# Patient Record
Sex: Female | Born: 1950 | Race: Black or African American | Hispanic: No | Smoking: Former smoker
Health system: Southern US, Community
[De-identification: ages and names within clinical notes are randomized; demographics above are authoritative.]

## PROBLEM LIST (undated history)

## (undated) DIAGNOSIS — F329 Major depressive disorder, single episode, unspecified: Secondary | ICD-10-CM

## (undated) DIAGNOSIS — I10 Essential (primary) hypertension: Secondary | ICD-10-CM

## (undated) DIAGNOSIS — D126 Benign neoplasm of colon, unspecified: Secondary | ICD-10-CM

## (undated) DIAGNOSIS — F319 Bipolar disorder, unspecified: Secondary | ICD-10-CM

## (undated) DIAGNOSIS — F209 Schizophrenia, unspecified: Secondary | ICD-10-CM

## (undated) DIAGNOSIS — IMO0001 Reserved for inherently not codable concepts without codable children: Secondary | ICD-10-CM

## (undated) DIAGNOSIS — F32A Depression, unspecified: Secondary | ICD-10-CM

## (undated) DIAGNOSIS — E669 Obesity, unspecified: Secondary | ICD-10-CM

## (undated) HISTORY — PX: REMOVAL OF GASTROINTESTINAL STOMATIC  TUMOR OF STOMACH: SHX6339

---

## 2007-12-22 ENCOUNTER — Emergency Department: Payer: Self-pay

## 2007-12-28 ENCOUNTER — Other Ambulatory Visit: Payer: Self-pay

## 2007-12-28 ENCOUNTER — Inpatient Hospital Stay: Payer: Self-pay | Admitting: Internal Medicine

## 2008-08-29 IMAGING — CT CT HEAD WITHOUT CONTRAST
2 series · 16 of 30 positions shown, 20 images · non-contrast
Comparison: none

REASON FOR EXAM: unwitnessed fall this morning, LOC changes
COMMENTS:

[Series 2: without · axial · non-contrast · 0.46mm/px · z∈[-176,-56]mm · 13 of 29 slices shown, 17 images]
[im 3/29  brain]
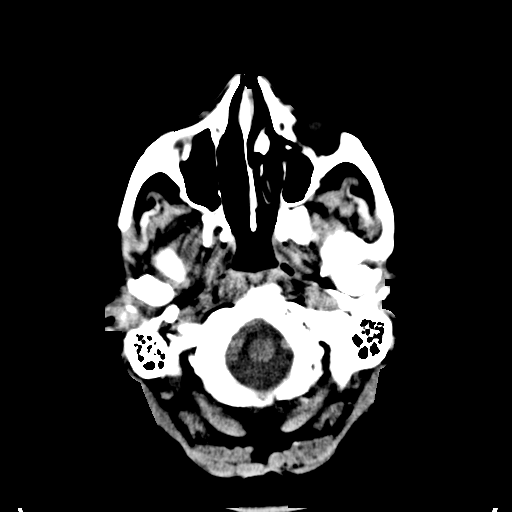
[im 3/29  bone]
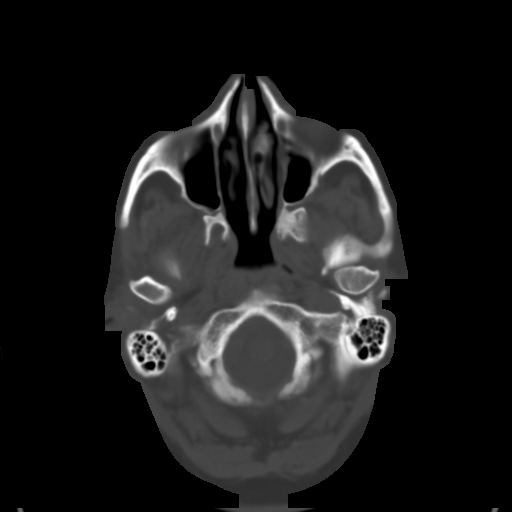
[im 5/29  brain]
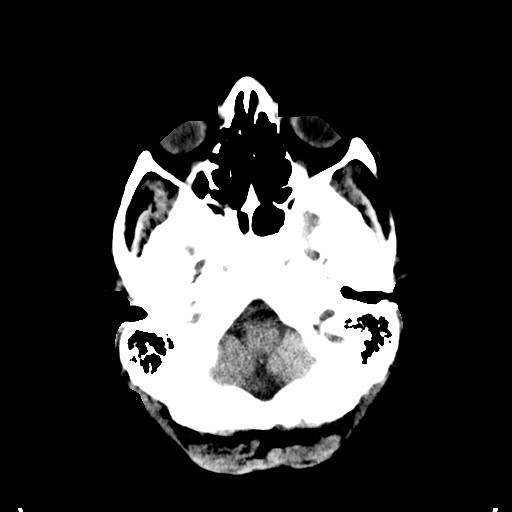
[im 7/29  brain]
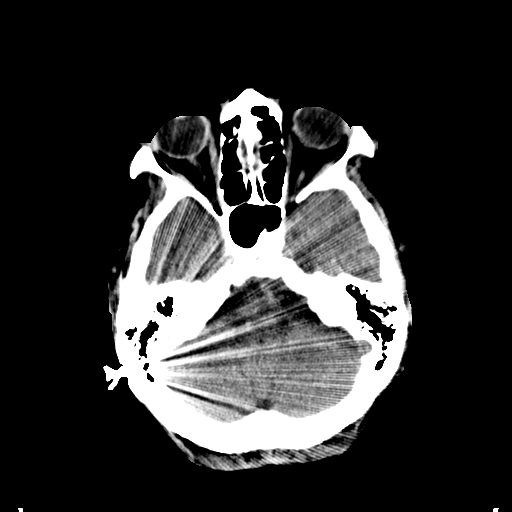
[im 9/29  brain]
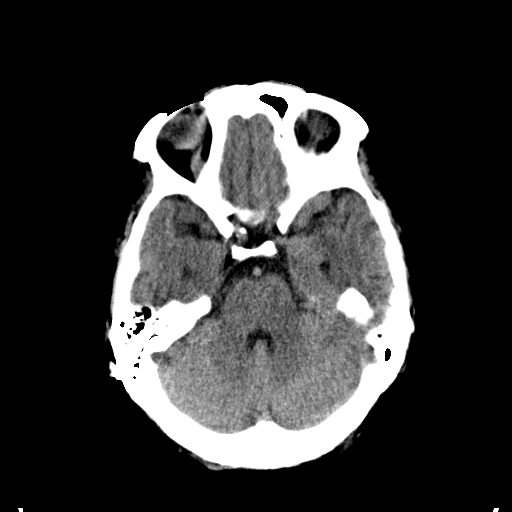
[im 11/29  brain]
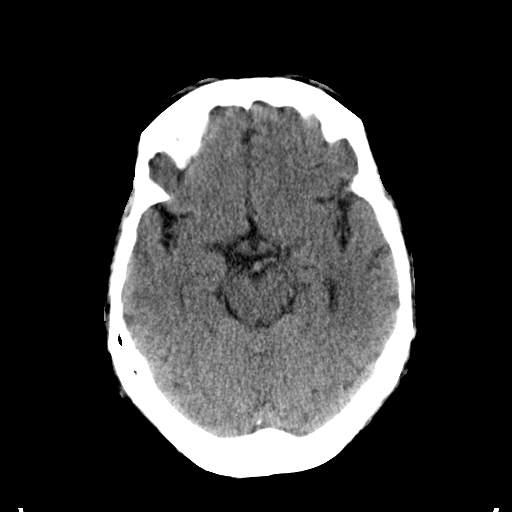
[im 11/29  bone]
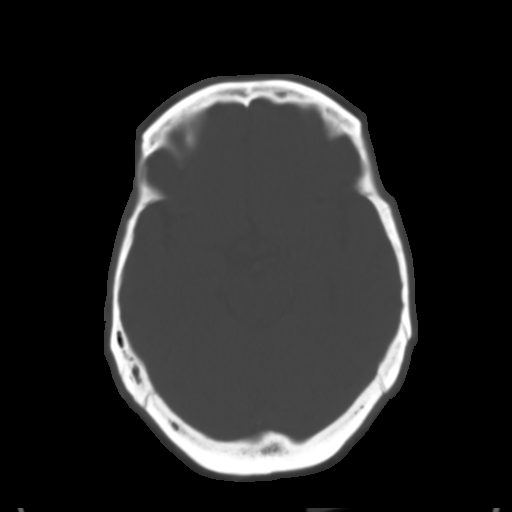
[im 13/29  brain]
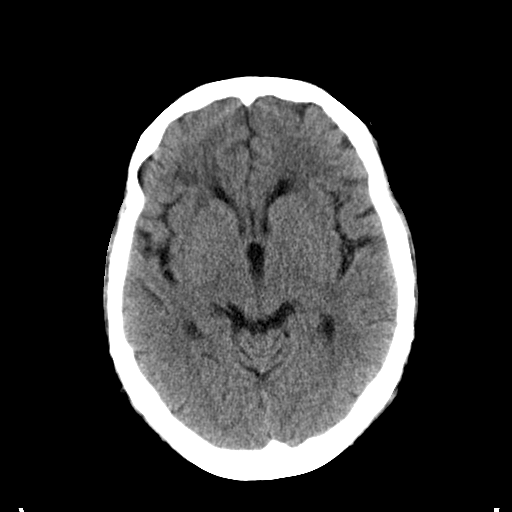
[im 15/29  brain]
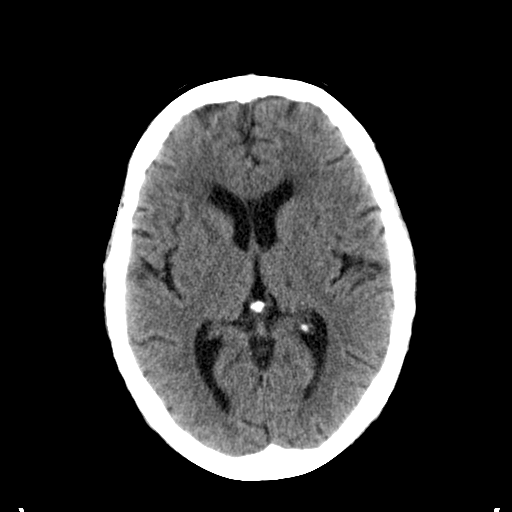
[im 17/29  brain]
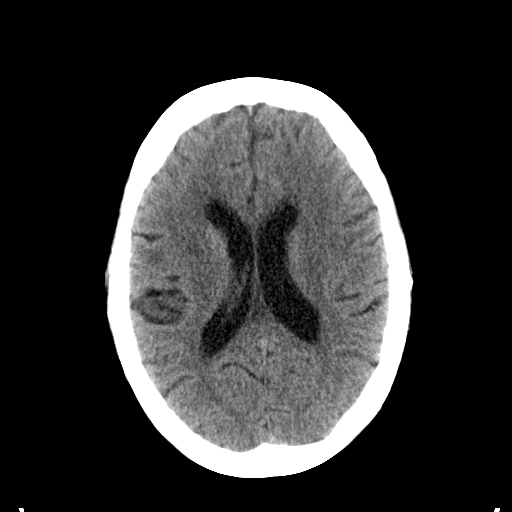
[im 19/29  brain]
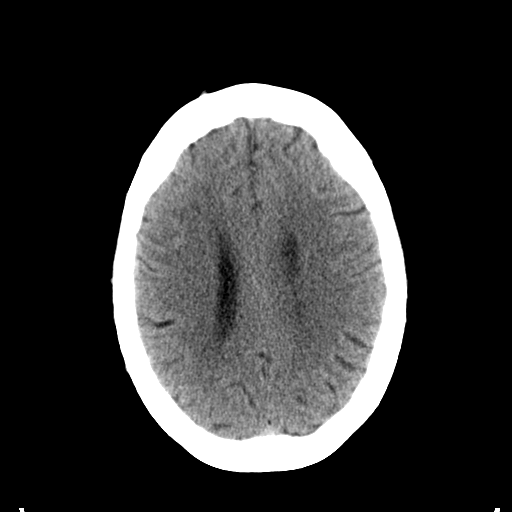
[im 19/29  bone]
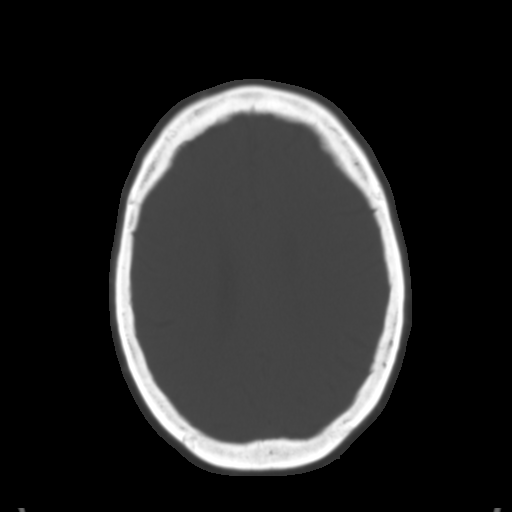
[im 21/29  brain]
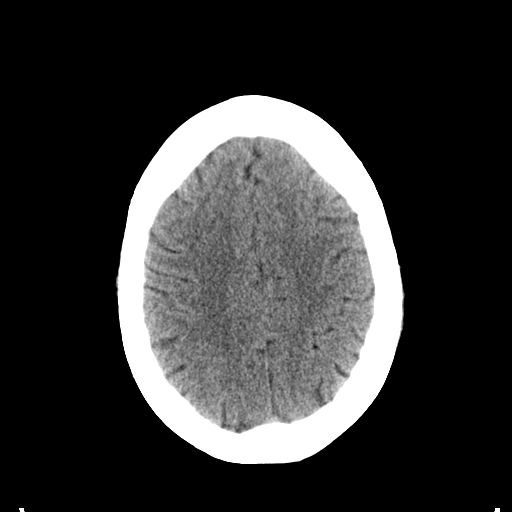
[im 23/29  brain]
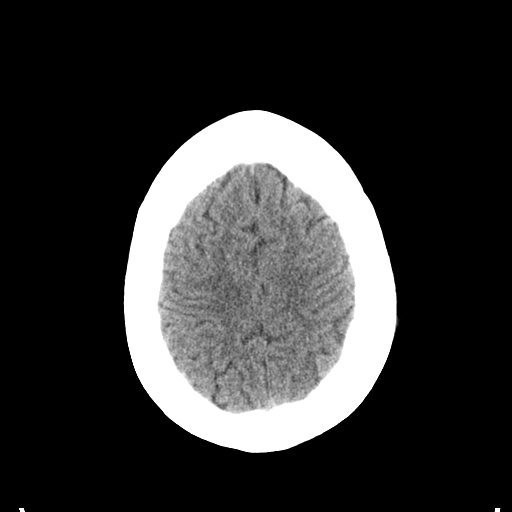
[im 25/29  brain]
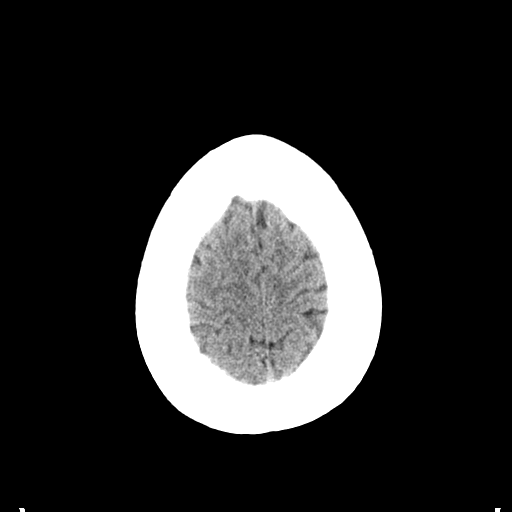
[im 27/29  brain]
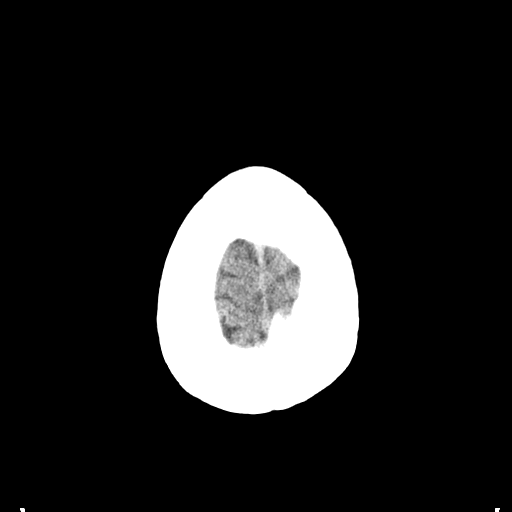
[im 27/29  bone]
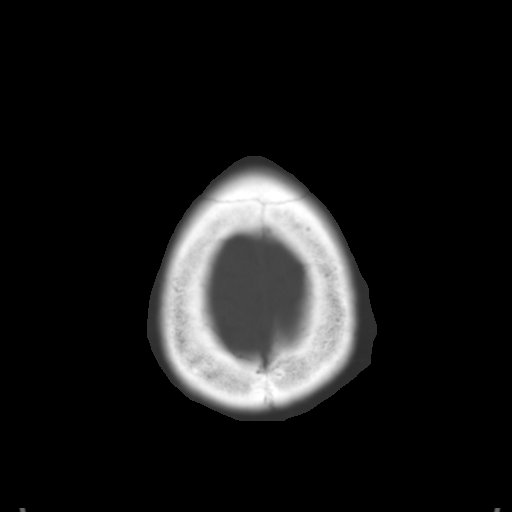

[Series 3: bone · axial · 0.46mm/px · z∈[-176,-136]mm · 3 of 29 slices shown]
[im 3/29  bone]
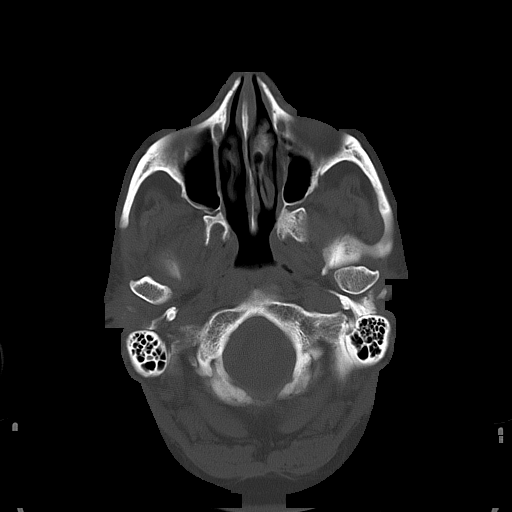
[im 7/29  bone]
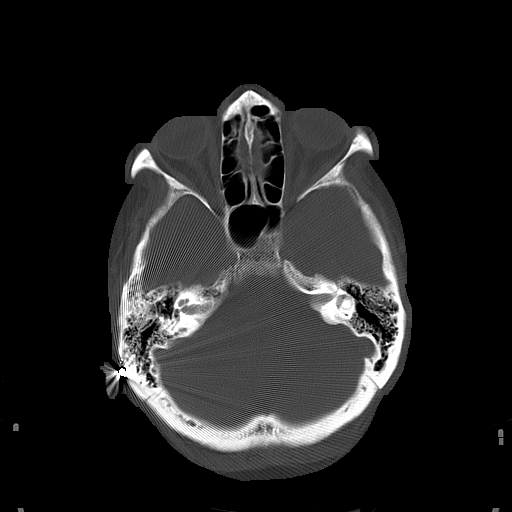
[im 11/29  bone]
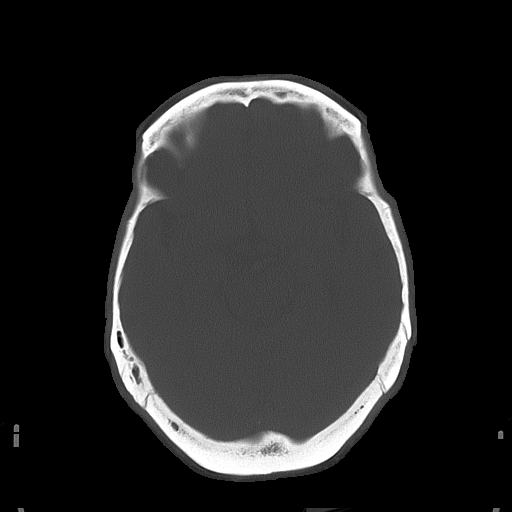

[16 of 30 positions shown; findings below may reference images not displayed]

PROCEDURE:     CT  - CT HEAD WITHOUT CONTRAST  - January 02, 2008  [DATE]

RESULT:     Comparison is made to prior study dated 12-28-07.

There is no evidence of intra-axial or extra-axial fluid collections or
evidence of acute hemorrhage. No secondary signs are appreciated to suggest
mass-effect, subacute or chronic infarction. The visualized bony skeleton
demonstrates no evidence of fracture nor dislocation. There is evidence of
diffuse cortical atrophy and areas of low attenuation within the subcortical
and deep white matter regions.
IMPRESSION: 1.Age related changes, findings which appear to represent the sequela of
small vessel ischemic changes without evidence of focal or acute
abnormalities.

## 2009-01-27 ENCOUNTER — Ambulatory Visit: Payer: Self-pay | Admitting: Family Medicine

## 2009-09-13 ENCOUNTER — Ambulatory Visit: Payer: Self-pay | Admitting: Gastroenterology

## 2010-07-01 ENCOUNTER — Inpatient Hospital Stay: Payer: Self-pay | Admitting: Internal Medicine

## 2012-05-09 ENCOUNTER — Inpatient Hospital Stay: Payer: Self-pay | Admitting: Internal Medicine

## 2012-05-09 LAB — URINALYSIS, COMPLETE
Bilirubin,UR: NEGATIVE
Glucose,UR: NEGATIVE mg/dL (ref 0–75)
Hyaline Cast: 3
Nitrite: NEGATIVE
Ph: 5 (ref 4.5–8.0)
RBC,UR: 5 /HPF (ref 0–5)
Specific Gravity: 1.026 (ref 1.003–1.030)
Squamous Epithelial: 2
Transitional Epi: <1

## 2012-05-09 LAB — COMPREHENSIVE METABOLIC PANEL
Albumin: 3.5 g/dL (ref 3.4–5.0)
Anion Gap: 6 — ABNORMAL LOW (ref 7–16)
BUN: 24 mg/dL — ABNORMAL HIGH (ref 7–18)
Co2: 31 mmol/L (ref 21–32)
Osmolality: 282 (ref 275–301)
Potassium: 3.8 mmol/L (ref 3.5–5.1)
SGOT(AST): 70 U/L — ABNORMAL HIGH (ref 15–37)
Sodium: 139 mmol/L (ref 136–145)
Total Protein: 6.8 g/dL (ref 6.4–8.2)

## 2012-05-09 LAB — LITHIUM LEVEL: Lithium: <0.2 mmol/L — ABNORMAL LOW

## 2012-05-09 LAB — TROPONIN I: Troponin-I: 0.07 ng/mL — ABNORMAL HIGH

## 2012-05-09 LAB — ETHANOL
Ethanol %: <0.003 % (ref 0.000–0.080)
Ethanol: <3 mg/dL

## 2012-05-09 LAB — CBC
MCH: 28.4 pg (ref 26.0–34.0)
MCV: 86 fL (ref 80–100)
RDW: 12.9 % (ref 11.5–14.5)
WBC: 3.4 10*3/uL — ABNORMAL LOW (ref 3.6–11.0)

## 2012-05-09 LAB — CK TOTAL AND CKMB (NOT AT ARMC)
CK, Total: 991 U/L — ABNORMAL HIGH (ref 21–215)
CK-MB: 3.1 ng/mL (ref 0.5–3.6)

## 2012-05-09 LAB — DRUG SCREEN, URINE
Barbiturates, Ur Screen: NEGATIVE (ref ?–200)
MDMA (Ecstasy)Ur Screen: POSITIVE (ref ?–500)
Methadone, Ur Screen: NEGATIVE (ref ?–300)
Opiate, Ur Screen: NEGATIVE (ref ?–300)
Phencyclidine (PCP) Ur S: NEGATIVE (ref ?–25)

## 2012-05-09 LAB — ACETAMINOPHEN LEVEL: Acetaminophen: 3 ug/mL — ABNORMAL LOW

## 2012-05-09 LAB — SALICYLATE LEVEL: Salicylates, Serum: <1.7 mg/dL

## 2012-05-10 LAB — BASIC METABOLIC PANEL
Anion Gap: 7 (ref 7–16)
BUN: 21 mg/dL — ABNORMAL HIGH (ref 7–18)
Chloride: 106 mmol/L (ref 98–107)
Co2: 26 mmol/L (ref 21–32)
EGFR (African American): >60
EGFR (Non-African Amer.): 56 — ABNORMAL LOW
Glucose: 151 mg/dL — ABNORMAL HIGH (ref 65–99)
Osmolality: 283 (ref 275–301)
Potassium: 4.2 mmol/L (ref 3.5–5.1)
Sodium: 139 mmol/L (ref 136–145)

## 2012-05-10 LAB — CBC WITH DIFFERENTIAL/PLATELET
Basophil #: 0 10*3/uL (ref 0.0–0.1)
Basophil %: 0 %
Eosinophil %: 0 %
HCT: 39.6 % (ref 35.0–47.0)
Lymphocyte %: 42.9 %
MCHC: 33.3 g/dL (ref 32.0–36.0)
Monocyte #: 0.1 x10 3/mm — ABNORMAL LOW (ref 0.2–0.9)
Monocyte %: 6.2 %
Neutrophil #: 0.7 10*3/uL — ABNORMAL LOW (ref 1.4–6.5)
WBC: 1.4 10*3/uL — CL (ref 3.6–11.0)

## 2012-05-10 LAB — CK TOTAL AND CKMB (NOT AT ARMC)
CK, Total: 598 U/L — ABNORMAL HIGH (ref 21–215)
CK-MB: 2.3 ng/mL (ref 0.5–3.6)

## 2012-05-10 LAB — TROPONIN I: Troponin-I: 0.06 ng/mL — ABNORMAL HIGH

## 2012-05-11 LAB — CBC WITH DIFFERENTIAL/PLATELET
Basophil #: 0 10*3/uL (ref 0.0–0.1)
Basophil %: 0 %
Eosinophil #: 0 10*3/uL (ref 0.0–0.7)
Eosinophil %: 0 %
HCT: 40 % (ref 35.0–47.0)
HGB: 13.1 g/dL (ref 12.0–16.0)
Lymphocyte #: 0.8 10*3/uL — ABNORMAL LOW (ref 1.0–3.6)
Lymphocyte %: 17.7 %
MCH: 28.3 pg (ref 26.0–34.0)
MCV: 86 fL (ref 80–100)
Neutrophil #: 3.4 10*3/uL (ref 1.4–6.5)
Platelet: 165 10*3/uL (ref 150–440)
RBC: 4.65 10*6/uL (ref 3.80–5.20)

## 2012-06-03 ENCOUNTER — Ambulatory Visit: Payer: Self-pay | Admitting: Family Medicine

## 2013-04-13 ENCOUNTER — Ambulatory Visit: Payer: Self-pay | Admitting: Family Medicine

## 2013-06-25 ENCOUNTER — Ambulatory Visit: Payer: Self-pay | Admitting: Family Medicine

## 2015-03-07 ENCOUNTER — Ambulatory Visit: Payer: Self-pay | Admitting: Physician Assistant

## 2015-04-24 NOTE — H&P (Signed)
PATIENT NAME:  Jill Smith, Jill Smith MR#:  [id number] DATE OF BIRTH:  [birth year]  DATE OF ADMISSION:  05/09/2012  PRIMARY CARE PHYSICIAN:  Dr Bertram Gala.    HISTORY OF PRESENT ILLNESS:  Patient is a 64 year old African American female with past medical history significant for history of neutropenia, history of schizophrenia, muscle weakness, history of impaired fasting glucose, also history of renal disease, questionable chronic obstructive pulmonary disease as she is on inhalers at home, presented to the hospital with complaints of poor responsiveness, altered mental status. Apparently the patient was found by home health nursing staff being almost unresponsive, decreased level of consciousness. The patient's sister, however, admits that patient has been having problems for the past 1 to 2 weeks. She has been falling at least 2 or 3 times a day. She is very weak, getting weak over a period of time especially over the past one week. She apparently is taking the daytime medications at nighttime and vice versa.  Over the past two days she has wheezing, having some cough with scant sputum production. No noted fevers, and the patient is a little bit more awake now and is able to complain of weakness as well as buttocks pain.  Patient's sister tells me that she is gaining weight, and she has been having cough and significant wheezes, but no reported shortness of breath or chest pain. She was very drowsy initially in the emergency room but able to also respond to verbal stimuli. Her speech was slurry and remained slurry during my evaluation. Patient not able to provide much more history. She was noted to be somewhat hypertensive with systolic blood pressure of 97/56 and oxygen saturation was 94% on room air and very wheezy, and hospitalist service was contacted for admission.   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 Significant for ataxia, slurry speech as well as tremors, altered mental status,  as well as confusion due to lithium toxicity at least twice in December 2008.  Other times she also had acute renal failure.  She was admitted also in July 2011. At that time, the patient's lithium was discontinued. History of hypertension, neuroleptic-induced tardive dyskinesia, tremors.    PAST SURGICAL HISTORY: None.   MEDICATIONS: According to medical records, she is on trihexyphenidyl 2 mg 3 times daily, isosorbide mononitrate 60 mg p.o. daily, aspirin 81 mg p.o. daily, metoprolol tartrate 25 mg p.o. twice daily, vitamin E 400 units 2 capsules twice daily. Haldol injections 150 mg intramuscularly monthly. Last apparently was on March 05, 2012.  Lisinopril 10 mg p.o. daily.  Bupropion SR 150 mg p.o. twice daily, triamcinolone acetonide to face and neck 1 to 2 times daily for dark spots.  Latuda 80 mg p.o. at supper. Haloperidol 2 mg in the morning and 4 mg at bedtime.  Tri-Luma 0.01/4/0.05% cream apply to dark spots twice daily.  Vitamin D 50,000 units p.o. monthly.  Tylenol 325 mg p.o. every 6 hours as needed.  Ventolin HFA 2 puffs as needed. Nystatin 100,000 units in 1 gram powder under the breasts 3 times daily as needed.  Diazepam10 mg p.o. twice  daily.    SOCIAL HISTORY: No smoking, however used to smoke as well as drink alcohol.  She is not smoking now for the past 20 years or so. She is single. No children. Lives with her sister. She is on disability.   FAMILY HISTORY: Hypertension as well as diabetes in  patient's mother. Patient's father died from abdominal cancer.   REVIEW OF SYSTEMS: Difficult to obtain as patient is loopy, somnolent, has been having weakness for the past 1 to 2 weeks with the help of her sister. She admits of pain in her buttock area. Weight gain, approximately 280 pounds now. Cough as well as wheezing and inability to stand or walk but no lateralized weakness. No fevers, no chills, blurry vision, double vision, glaucoma, or cataracts. ENT: Denies any tinnitus, allergies, epistaxis, sinus pain, dentures, difficulty swallowing. She denies any hemoptysis, asthma, COPD.   CARDIOVASCULAR: Denies any chest pain, orthopnea, PND, erythema, arrhythmia, palpitations, or syncope.  GASTROINTESTINAL: Denies nausea, vomiting, diarrhea, or constipation. GENITOURINARY: Denies dysuria, hematuria, frequency, incontinence. ENDOCRINOLOGY: Denies polydipsia, nocturia, thyroid problems, heat or cold intolerance, or thirst.  HEMATOLOGIC: Denies anemia, easy bruising, bleeding, swollen glands. SKIN: Denies acne, rashes, lesions, or change in moles. MUSCULOSKELETAL: Denies arthritis, cramps or gout.  NEUROLOGIC: No numbness, epilepsy, or tremor. PSYCHIATRY: Denies anxiety, insomnia, or depression.   PHYSICAL EXAMINATION:  VITAL SIGNS: On arrival to the hospital, temperature is 97.3, pulse 61, respiration rate 18, blood pressure 97/59, saturation 94% on room air.   GENERAL: This is well-developed, well-nourished, obese African American female, very somnolent, however able to open her eyes and converse briefly, slurring of speech.   HEENT: Her pupils are equal, reactive to light.  Extraocular muscles intact. No icterus or conjunctivitis. Normal hearing. No pharyngeal erythema.  Mucosa is dry.   NECK: No masses. Supple, nontender.  Thyroid not enlarged.  No adenopathy.  No JVD, carotid bruits bilaterally. Full range of motion.   LUNGS: Patient does have significant wheezing bilaterally, inspiratory as well  as expiratory.  Few rhonchi were heard, somewhat diminished breath sounds bilaterally, labored inspirations as well as increased effort to breathe. No dullness to percussion. In mild respiratory distress and slightly tachypneic, especially whenever she tries to speak.   CARDIOVASCULAR: S1, S2 appreciated. Rythm wasd regular. PMI nondisplaced. Chest is nontender to palpation.      EXTREMITIES: 1+ pedal pulses. No lower extremity edema, calf tenderness, cyanosis was noted.   ABDOMEN: Soft, nontender. Bowel sounds are present.  No hepatosplenomegaly or masses were noted.     RECTAL: Deferred.    MUSCULOSKELETAL: Able to move her upper extremities as well as lower extremities feet and toes.  Not able to do much more because of significant somnolence and poor cooperation. Not able to assess her for kyphosis. Gait is not tested.   SKIN: Skin did not reveal any rashes, lesions, erythema, nodularity, or induration. It was warm and dry to palpation.   LYMPH: No adenopathy in the cervical region.   NEUROLOGICAL: Cranial nerves grossly intact. Sensory is grossly intact. However, difficult to evaluate. The patient does have some dysarthria, slurring of speech, no aphasia. She is somnolent, poorly oriented. However, recognizes me from some time ago when I took her history and physical in July 2011.    LABORATORIES: BMP showed glucose 106, BUN of 24, otherwise been unremarkable. Estimated GFR for African American would be 53. Alcohol level less than 0.003%. Liver enzymes showed AST elevation to 70. Cardiac enzymes : CK total was 1511, MB  fraction 5.0, troponin elevated at 0.08. Lithium level less than 0.2. Urine drug screen was negative for all metabolize.  White blood cell count is low at 3.4, hemoglobin 13.4, platelet count 168,000. Urinalysis: Yellow clear urine, negative for glucose, bilirubin, or ketones, specific gravity 1.026, pH 5.0, 1+ blood, 30 mg/dL of protein, negative for nitrites or leukocyte  esterase, 5 red blood cells, 1 white blood cells, no bacteria, 2 epithelial cells, less than 1 transitional epithelial cells. Mucous is present, 3 hyaline cast as well as amorphous crystals were present. Acetaminophen level is 3.  Salicylate level less than 1.7. ABGs were done on room air, showed pH of 7.37, pCO2 46, pO2 of 70, saturation 93.3% on 21% FiO2 with bicarbonate level elevated to 26.6.   RADIOLOGIC STUDIES: Pelvic AP only, showed no acute fracture, findings which likely represent sequela of old prior trauma in left iliopsoas tendon according to radiologist. Chest portable single view May 09, 2012, showed clear lungs. Left femur x-ray, no acute fracture.  CT scan of head with no contrast showed no evidence of acute ischemic or hemorrhagic event; very mild changes of chronic small vessel ischemia are present.   ASSESSMENT AND PLAN:  1. Encephalopathy, likely metabolic related to patient's respiratory acidosis.  Admit patient to medical floor, follow her mental status and initiate BiPAP if needed to keep pulse oximetry at around 88% to 92%. Start patient on Solu-Medrol, Advair, as well as inhalation therapy with nebulizers.  2. Elevated troponin. We will get echocardiogram as well as cardiology consultation, possible demand ischemia.  We will continued aspirin, nitroglycerin as well as metoprolol and heparin subcutaneously.   3. Rhabdomyolysis.  We will continue patient on IV fluids, follow creatinine.  4. Hypertension. We will be holding ACE inhibitor at this time.  We will continue IV fluids.              5. Leukopenia, likely chronic. We will be following.   TIME SPENT: 50 minutes.    ____________________________ [name], MD rv:vtd D: 05/09/2012 16:03:40 ET T: 05/10/2012 05:32:54 ET JOB#: [id number]  cc: [name], MD, <Dictator> Danelle Berry. Derrek Monaco, MD [name] MD ELECTRONICALLY SIGNED 05/10/2012 17:19

## 2015-04-24 NOTE — Discharge Summary (Signed)
PATIENT NAME:  Jill Smith, MONFILS MR#:  [id number] DATE OF BIRTH:  May 11, 1951  DATE OF ADMISSION:  05/09/2012 DATE OF DISCHARGE:  05/14/2012   DISCHARGE DIAGNOSES: 1. Encephalopathy. 2. Rhabdomyolysis. 3. Dehydration. 4. Chronic obstructive pulmonary disease exacerbation. 5. Hypertension.   CONDITION: Stable.   HOME MEDICATIONS:  1. Aspirin 81 mg p.o. daily. 2. Vitamin E 400 units twice daily.  3. Lopressor 25 mg p.o. b.i.d.  4. Haloperidol 2 mg p.o. at bedtime.  5. Trihexyphenidyl  2 mg p.o. t.i.d.  6. Bupropion XL 150 b.i.d.  7. Tylenol 325 mg 1 to 2 tablets p.o. q.4 to 6 hours p.r.n.  8. Imdur 60 mg p.o. daily.   ADDITIONAL MEDICATIONS:  1. Lisinopril 20 mg p.o. daily.  2. Prednisone 40 mg p.o. daily, then taper.  3. Ventolin inhaler 2 puffs q.4 hours p.r.n.   DO NOT TAKE: Lisinopril/HCTZ.   DIET: Low sodium diet.   ACTIVITY: As tolerated.   FOLLOW-UP CARE: Follow-up with PCP within 1 to 2 weeks.   OTHER COMMENTS: The patient needs home health and PT with walker.    PRIMARY CARE PHYSICIAN: Dr. Derrek Monaco    CONSULTATION: Cardiology, Dr. Humphrey Rolls    HISTORY OF PRESENT ILLNESS: The patient is a 64 year old African American female with a history of neutropenia, schizophrenia, muscle weakness, impaired fasting glucose, history of renal disease, and questionable chronic obstructive pulmonary disease who presented to the ED with poor responsiveness and altered mental status. In addition, the patient had a cough, significant wheezing, and shortness of breath. The patient was very drowsy initially in the ED and was found to have blood pressure of 97/56 and oxygen saturation 94% on room air. For detailed history and physical examination, please refer to the admission note dictated by Dr. [name].   LABORATORY, DIAGNOSTIC, AND RADIOLOGICAL DATA: On admission CK 1511. CK-MB 5.0. Troponin 0.08. Drug screen negative. AST 70. WBC 3.4. Hemoglobin 13.4. Platelet count 168,000.  CAT  scan of head did not show any evidence of acute ischemia or hemorrhage.  Chest x-ray showed clear lungs.   Pelvic x-ray no acute fracture.   HOSPITAL COURSE:  1. The patient was admitted for encephalopathy likely metabolic related to the patient's respiratory acidosis. After admission the patient was placed on BiPAP initially and then was taken off. In addition, the patient was treated with Solu-Medrol, Advair, and nebulizer p.r.n. for breathing and cough, possible COPD exacerbation.  2. For elevated troponin, echocardiogram was done which showed normal ejection fraction and Dr. Humphrey Rolls, cardiologist, evaluated the patient and suggested the patient had rhabdomyolysis. Elevated troponin is nonspecific. The patient does not need any further cardiac work-up. Continue aspirin, nitro p.r.n., and Lopressor.  3. For rhabdomyolysis, the patient was treated with IV fluids. CK and CK-MB improved.  4. For hypertension, since the patient had lower side blood pressure lisinopril was on hold but later the patient developed hypertension so lisinopril was resumed but still held HCTZ. Continued Imdur and Lopressor. Blood pressure has been controlled. 5. Leukopenia. The patient developed worsening neutropenia. WBC decreased to 1.4 possibly due to lab error. Repeat WBC normal. After above-mentioned treatment, the patient's mental status has improved. The patient is alert, awake, oriented. Complains of bilateral leg weakness. The patient has been treated with PT for weakness. She can walk around with assistance.  6. For chronic obstructive pulmonary disease exacerbation, the patient was planned to be discharged yesterday, however, the patient developed some wheezing again so the patient was treated with Solu-Medrol IV and nebulizer  p.r.n. Symptoms have improved. Today the patient has no complaints. She is alert, awake, and oriented.    VITAL SIGNS: Temperature 97.7, blood pressure 138/85, pulse 64, oxygen saturation 95% on  room air. Physical examination is unremarkable.  DISPOSITION: The patient is clinically stable and will be discharged to assisted living facility with home health and PT with a walker.   Discussed the discharge plan with the patient, patient's family, nurse, and case Freight forwarder.   TIME SPENT: About 40 minutes.  ____________________________ Jill Loll, MD qc:drc D: 05/14/2012 12:55:30 ET T: 05/14/2012 13:21:47 ET JOB#: [id number]  cc: Jill Loll, MD, <Dictator> Jill Smith. Derrek Monaco, MD Jill Loll MD ELECTRONICALLY SIGNED 05/14/2012 15:49

## 2015-04-24 NOTE — Consult Note (Signed)
PATIENT NAME:  Jill Smith, Jill Smith MR#:  [id number] DATE OF BIRTH:  Aug 13, 1951  DATE OF CONSULTATION:  05/10/2012  REFERRING PHYSICIAN:   CONSULTING PHYSICIAN:  Dionisio David, MD  INDICATION FOR CONSULTATION: Elevated troponin.  HISTORY OF PRESENT ILLNESS: This is a 64 year old African American female with a past medical history of neutropenia, history of schizophrenia, muscle weakness came in apparently she says that she was feeling weakness in her legs and all of a sudden she fell out several times and she was brought to the hospital. She had decreased level of consciousness but she did not completely pass out.   PAST MEDICAL HISTORY:  1. History of schizophrenia. 2. History of hypertension.  3. No history of diabetes or hypercholesterolemia.   SOCIAL HISTORY: She quit smoking and drinking, which she used to do up till three years ago. She used to drink heavily and also smoked 4 packs per day.   FAMILY HISTORY: Positive for diabetes.   ALLERGIES: None.   PHYSICAL EXAMINATION:  GENERAL: She is alert, oriented x3, in no acute distress right now.   VITAL SIGNS: Stable.   NECK: No JVD.   LUNGS: Clear.   HEART: Regular rate, rhythm. Normal S1, S2. No audible murmur.   ABDOMEN: Soft, nontender. Positive bowel sounds.   EXTREMITIES: No pedal edema.   NEUROLOGIC: She appears to be intact.   LABORATORY, DIAGNOSTIC AND RADIOLOGICAL DATA: EKG shows normal sinus rhythm, 61 beats per minute. No acute changes. She had mildly elevated troponin of 0.08, however, the CPK was 1511, CPK-MB 5, and the third set is 0.06 and CPK 598 so is trending down.   ASSESSMENT AND PLAN: Elevated troponin secondary to rhabdomyolysis status post fall. EKG is normal. She denies any chest pain or shortness of breath. This most likely is secondary to noncardiac source of mildly elevated troponin secondary to rhabdomyolysis.   Thank you very much for referral.  ____________________________ Dionisio David,  MD sak:cms D: 05/10/2012 10:21:24 ET T: 05/10/2012 10:36:43 ET JOB#: [id number]  cc: Dionisio David, MD, <Dictator> Dionisio David MD ELECTRONICALLY SIGNED 05/20/2012 11:04

## 2015-04-24 NOTE — Consult Note (Signed)
Consult dictated, 60 YOBF came after falling on porch twice and developed rhabdomylysis, and mildly elevated troponin whic is trending down. This is not due to NSTEMI, but rather demand ischaemia, since denies chest pain and EKG is normal, no further W/U needed.  Electronic Signatures: Angelica Ran (MD)  (Signed on 11-May-13 10:24)  Authored  Last Updated: 11-May-13 10:24 by Angelica Ran (MD)

## 2016-01-16 ENCOUNTER — Encounter: Payer: Self-pay | Admitting: *Deleted

## 2016-01-17 ENCOUNTER — Encounter
Admission: RE | Disposition: A | Discharge status: Home / Self Care | Payer: Self-pay | Source: Ambulatory Visit | Attending: Gastroenterology

## 2016-01-17 ENCOUNTER — Ambulatory Visit
Admission: RE | Admit: 2016-01-17 | Discharge: 2016-01-17 | Disposition: A | Discharge status: Home / Self Care | Payer: Medicare HMO | Source: Ambulatory Visit | Attending: Gastroenterology | Admitting: Gastroenterology

## 2016-01-17 ENCOUNTER — Ambulatory Visit: Payer: Medicare HMO | Admitting: Anesthesiology

## 2016-01-17 DIAGNOSIS — F209 Schizophrenia, unspecified: Secondary | ICD-10-CM | POA: Diagnosis not present

## 2016-01-17 DIAGNOSIS — Z6841 Body Mass Index (BMI) 40.0 and over, adult: Secondary | ICD-10-CM | POA: Insufficient documentation

## 2016-01-17 DIAGNOSIS — F329 Major depressive disorder, single episode, unspecified: Secondary | ICD-10-CM | POA: Insufficient documentation

## 2016-01-17 DIAGNOSIS — I10 Essential (primary) hypertension: Secondary | ICD-10-CM | POA: Diagnosis not present

## 2016-01-17 DIAGNOSIS — Z86010 Personal history of colonic polyps: Secondary | ICD-10-CM | POA: Insufficient documentation

## 2016-01-17 DIAGNOSIS — Z1211 Encounter for screening for malignant neoplasm of colon: Secondary | ICD-10-CM | POA: Insufficient documentation

## 2016-01-17 DIAGNOSIS — E669 Obesity, unspecified: Secondary | ICD-10-CM | POA: Diagnosis not present

## 2016-01-17 DIAGNOSIS — Z79899 Other long term (current) drug therapy: Secondary | ICD-10-CM | POA: Diagnosis not present

## 2016-01-17 DIAGNOSIS — Z538 Procedure and treatment not carried out for other reasons: Secondary | ICD-10-CM | POA: Diagnosis not present

## 2016-01-17 HISTORY — PX: COLONOSCOPY WITH PROPOFOL: SHX5780

## 2016-01-17 HISTORY — DX: Reserved for inherently not codable concepts without codable children: IMO0001

## 2016-01-17 HISTORY — DX: Essential (primary) hypertension: I10

## 2016-01-17 HISTORY — DX: Obesity, unspecified: E66.9

## 2016-01-17 HISTORY — DX: Depression, unspecified: F32.A

## 2016-01-17 HISTORY — DX: Major depressive disorder, single episode, unspecified: F32.9

## 2016-01-17 HISTORY — DX: Schizophrenia, unspecified: F20.9

## 2016-01-17 SURGERY — COLONOSCOPY WITH PROPOFOL
Anesthesia: General

## 2016-01-17 MED ORDER — SODIUM CHLORIDE 0.9 % IV SOLN: INTRAVENOUS | Status: DC

## 2016-01-17 MED ORDER — PROPOFOL 10 MG/ML IV BOLUS
INTRAVENOUS | Status: DC | PRN
  Administered 2016-01-17: 30 mg via INTRAVENOUS

## 2016-01-17 MED ORDER — SODIUM CHLORIDE 0.9 % IV SOLN
INTRAVENOUS | Status: DC
  Administered 2016-01-17: 10:00:00 via INTRAVENOUS
  Administered 2016-01-17: 1000 mL via INTRAVENOUS

## 2016-01-17 NOTE — OR Nursing (Signed)
Case canceled due to poor prep.

## 2016-01-17 NOTE — Anesthesia Postprocedure Evaluation (Signed)
Anesthesia Post Note  Patient: Jill Smith  Procedure(s) Performed: Procedure(s) (LRB): COLONOSCOPY WITH PROPOFOL (N/A)  Patient location during evaluation: Endoscopy Anesthesia Type: General Level of consciousness: awake and alert Pain management: pain level controlled Vital Signs Assessment: post-procedure vital signs reviewed and stable Respiratory status: spontaneous breathing, nonlabored ventilation, respiratory function stable and patient connected to nasal cannula oxygen Cardiovascular status: blood pressure returned to baseline and stable Postop Assessment: no signs of nausea or vomiting Anesthetic complications: no    Last Vitals:  Filed Vitals:   01/17/16 1045 01/17/16 1055  BP: 160/84 160/84  Pulse: 65 70  Temp:    Resp: 16 15    Last Pain: There were no vitals filed for this visit.               Precious Haws Clydell Alberts

## 2016-01-17 NOTE — H&P (Addendum)
Outpatient short stay form Pre-procedure 01/17/2016 9:59 AM Lollie Sails MD  Primary Physician: [facility]  Reason for visit:  Colonoscopy  History of present illness:  Patient is a 65 year old female with a personal history of adenomatous colon polyps. She tolerated her prep well. He states she has not taken any aspirin products for a long time. Takes no blood thinning medications.    Current facility-administered medications:  .  0.9 %  sodium chloride infusion, , Intravenous, Continuous, Lollie Sails, MD, Last Rate: 20 mL/hr at 01/17/16 0813, 1,000 mL at 01/17/16 0813 .  0.9 %  sodium chloride infusion, , Intravenous, Continuous, Lollie Sails, MD  No prescriptions prior to admission     No Known Allergies   Past Medical History  Diagnosis Date  . Schizophrenia ([place])   . Obesity   . Hypertension   . Shortness of breath dyspnea   . Depression     Review of systems:      Physical Exam    Heart and lungs: Regular rate and rhythm without rub or gallop, lungs are bilaterally clear    HEENT: Norm cephalic atraumatic eyes are anicteric    Other:     Pertinant exam for procedure: Morbidly obese, bowel sounds are positive, nontender. Unable to palpate internal organs.    Planned proceedures: Colonoscopy and indicated procedures. I have discussed the risks benefits and complications of procedures to include not limited to bleeding, infection, perforation and the risk of sedation and the patient wishes to proceed.    Lollie Sails, MD Gastroenterology 01/17/2016  9:59 AM    Updated medication list in computer, no anticoagulants.

## 2016-01-17 NOTE — Transfer of Care (Signed)
Immediate Anesthesia Transfer of Care Note  Patient: Jill Smith  Procedure(s) Performed: Procedure(s): COLONOSCOPY WITH PROPOFOL (N/A)  Patient Location: PACU  Anesthesia Type:General  Level of Consciousness: awake, alert  and oriented  Airway & Oxygen Therapy: Patient Spontanous Breathing and Patient connected to nasal cannula oxygen  Post-op Assessment: Report given to RN and Post -op Vital signs reviewed and stable  Post vital signs: Reviewed and stable  Last Vitals:  Filed Vitals:   01/17/16 0757  BP: 141/72  Pulse: 55  Temp: 36.1 C  Resp: 17    Complications: No apparent anesthesia complications

## 2016-01-17 NOTE — Anesthesia Preprocedure Evaluation (Signed)
Anesthesia Evaluation  Patient identified by MRN, date of birth, ID band Patient awake    Reviewed: Allergy & Precautions, H&P , NPO status , Patient's Chart, lab work & pertinent test results  History of Anesthesia Complications Negative for: history of anesthetic complications  Airway Mallampati: III  TM Distance: >3 FB Neck ROM: full    Dental  (+) Poor Dentition, Chipped, Missing   Pulmonary former smoker,    Pulmonary exam normal breath sounds clear to auscultation       Cardiovascular Exercise Tolerance: Good hypertension, (-) angina(-) Past MI and (-) DOE Normal cardiovascular exam Rhythm:regular Rate:Normal     Neuro/Psych PSYCHIATRIC DISORDERS Depression Schizophrenia negative neurological ROS     GI/Hepatic negative GI ROS, Neg liver ROS,   Endo/Other  Morbid obesity  Renal/GU negative Renal ROS  negative genitourinary   Musculoskeletal   Abdominal   Peds  Hematology negative hematology ROS (+)   Anesthesia Other Findings Past Medical History:   Schizophrenia ([place])                                          Obesity                                                      Hypertension                                                 Shortness of breath dyspnea                                  Depression                                                  Past Surgical History:   REMOVAL OF GASTROINTESTINAL STOMATIC  TUMOR OF*              BMI    Body Mass Index   48.71 kg/m 2      Reproductive/Obstetrics negative OB ROS                             Anesthesia Physical Anesthesia Plan  ASA: III  Anesthesia Plan: General   Post-op Pain Management:    Induction:   Airway Management Planned:   Additional Equipment:   Intra-op Plan:   Post-operative Plan:   Informed Consent: I have reviewed the patients History and Physical, chart, labs and discussed the procedure  including the risks, benefits and alternatives for the proposed anesthesia with the patient or authorized representative who has indicated his/her understanding and acceptance.   Dental Advisory Given  Plan Discussed with: Anesthesiologist, CRNA and Surgeon  Anesthesia Plan Comments:         Anesthesia Quick Evaluation

## 2016-01-17 NOTE — Op Note (Signed)
[facility] Gastroenterology Patient Name: Jill Smith Procedure Date: 01/17/2016 10:00 AM MRN: [id number] Account #: [id number] Date of Birth: Sep 23, 1951 Admit Type: Outpatient Age: 65 Room: [facility] ENDO ROOM 3 Gender: Female Note Status: Finalized Procedure:         Colonoscopy Indications:       Personal history of colonic polyps Providers:         Lollie Sails, MD Referring MD:      Dr Juanda [facility], MD (Referring MD) Medicines:         Monitored Anesthesia Care Complications:     No immediate complications. Procedure:         Pre-Anesthesia Assessment:                    - ASA Grade Assessment: III - A patient with severe                     systemic disease.                    After obtaining informed consent, the colonoscope was                     passed under direct vision. Throughout the procedure, the                     patient's blood pressure, pulse, and oxygen saturations                     were monitored continuously. The Colonoscope was                     introduced through the anus with the intention of                     advancing to the cecum. The scope was advanced to the                     rectum before the procedure was aborted. Medications were                     given. The patient tolerated the procedure well. The                     quality of the bowel preparation was poor. The colonoscopy                     was aborted due to poor bowel prep with stool present. Findings:      A large amount of liquid solid stool was found in the rectum and in the       sigmoid colon, precluding visualization.      The digital rectal exam was normal. Impression:        - Preparation of the colon was poor.                    - The procedure was aborted due to poor bowel prep with                     stool present.                    - Stool in the rectum and in the sigmoid colon.                    -  No specimens  collected. Recommendation:    - Discharge patient to home.                    - repeat prep and reschedule Procedure Code(s): --- Professional ---                    [id number], 52, Sigmoidoscopy, flexible; diagnostic, including                     collection of specimen(s) by brushing or washing, when                     performed (separate procedure) Diagnosis Code(s): --- Professional ---                    Z53.8, Procedure and treatment not carried out for other                     reasons                    Z86.010, Personal history of colonic polyps CPT copyright 2014 American [employer]. All rights reserved. The codes documented in this report are preliminary and upon coder review may  be revised to meet current compliance requirements. Lollie Sails, MD 01/17/2016 10:21:10 AM This report has been signed electronically. Number of Addenda: 0 Note Initiated On: 01/17/2016 10:00 AM Total Procedure Duration: 0 hours 1 minute 11 seconds       [facility]

## 2016-01-18 ENCOUNTER — Encounter: Payer: Self-pay | Admitting: Gastroenterology

## 2016-02-27 ENCOUNTER — Encounter: Payer: Self-pay | Admitting: *Deleted

## 2016-02-28 ENCOUNTER — Ambulatory Visit
Admission: RE | Admit: 2016-02-28 | Discharge: 2016-02-28 | Disposition: A | Discharge status: Home / Self Care | Payer: Medicare HMO | Source: Ambulatory Visit | Attending: Gastroenterology | Admitting: Gastroenterology

## 2016-02-28 ENCOUNTER — Ambulatory Visit: Payer: Medicare HMO | Admitting: Anesthesiology

## 2016-02-28 ENCOUNTER — Encounter
Admission: RE | Disposition: A | Discharge status: Home / Self Care | Payer: Self-pay | Source: Ambulatory Visit | Attending: Gastroenterology

## 2016-02-28 ENCOUNTER — Encounter: Payer: Self-pay | Admitting: Gastroenterology

## 2016-02-28 DIAGNOSIS — Z86010 Personal history of colonic polyps: Secondary | ICD-10-CM | POA: Insufficient documentation

## 2016-02-28 DIAGNOSIS — I10 Essential (primary) hypertension: Secondary | ICD-10-CM | POA: Insufficient documentation

## 2016-02-28 DIAGNOSIS — Z87891 Personal history of nicotine dependence: Secondary | ICD-10-CM | POA: Diagnosis not present

## 2016-02-28 DIAGNOSIS — Z1211 Encounter for screening for malignant neoplasm of colon: Secondary | ICD-10-CM | POA: Diagnosis not present

## 2016-02-28 DIAGNOSIS — F329 Major depressive disorder, single episode, unspecified: Secondary | ICD-10-CM | POA: Diagnosis not present

## 2016-02-28 DIAGNOSIS — Z6841 Body Mass Index (BMI) 40.0 and over, adult: Secondary | ICD-10-CM | POA: Insufficient documentation

## 2016-02-28 DIAGNOSIS — Z7982 Long term (current) use of aspirin: Secondary | ICD-10-CM | POA: Diagnosis not present

## 2016-02-28 DIAGNOSIS — F209 Schizophrenia, unspecified: Secondary | ICD-10-CM | POA: Diagnosis not present

## 2016-02-28 DIAGNOSIS — E669 Obesity, unspecified: Secondary | ICD-10-CM | POA: Diagnosis not present

## 2016-02-28 DIAGNOSIS — Z79899 Other long term (current) drug therapy: Secondary | ICD-10-CM | POA: Insufficient documentation

## 2016-02-28 HISTORY — DX: Benign neoplasm of colon, unspecified: D12.6

## 2016-02-28 HISTORY — PX: COLONOSCOPY WITH PROPOFOL: SHX5780

## 2016-02-28 SURGERY — COLONOSCOPY WITH PROPOFOL
Anesthesia: General

## 2016-02-28 MED ORDER — SODIUM CHLORIDE 0.9 % IV SOLN
INTRAVENOUS | Status: DC
  Administered 2016-02-28: 1000 mL via INTRAVENOUS

## 2016-02-28 MED ORDER — SODIUM CHLORIDE 0.9 % IV SOLN: INTRAVENOUS | Status: DC

## 2016-02-28 MED ORDER — PROPOFOL 500 MG/50ML IV EMUL
INTRAVENOUS | Status: DC | PRN
  Administered 2016-02-28: 120 ug/kg/min via INTRAVENOUS

## 2016-02-28 MED ORDER — LIDOCAINE HCL (CARDIAC) 20 MG/ML IV SOLN
INTRAVENOUS | Status: DC | PRN
  Administered 2016-02-28: 60 mg via INTRAVENOUS

## 2016-02-28 MED ORDER — PROPOFOL 10 MG/ML IV BOLUS
INTRAVENOUS | Status: DC | PRN
  Administered 2016-02-28: 30 mg via INTRAVENOUS

## 2016-02-28 MED ORDER — MIDAZOLAM HCL 2 MG/2ML IJ SOLN
INTRAMUSCULAR | Status: DC | PRN
  Administered 2016-02-28: 1 mg via INTRAVENOUS

## 2016-02-28 NOTE — Op Note (Signed)
[facility] Gastroenterology Patient Name: Jill Smith Procedure Date: 02/28/2016 10:29 AM MRN: [id number] Account #: [id number] Date of Birth: 09-14-1951 Admit Type: Outpatient Age: 65 Room: [facility] ENDO ROOM 3 Gender: Female Note Status: Finalized Procedure:            Colonoscopy Indications:          Personal history of colonic polyps Providers:            Lollie Sails, MD Referring MD:         Dr Juanda [facility], MD (Referring MD) Medicines:            Monitored Anesthesia Care Complications:        No immediate complications. Procedure:            Pre-Anesthesia Assessment:                       - ASA Grade Assessment: III - A patient with severe                        systemic disease.                       After obtaining informed consent, the colonoscope was                        passed under direct vision. Throughout the procedure,                        the patient's blood pressure, pulse, and oxygen                        saturations were monitored continuously. The                        Colonoscope was introduced through the anus with the                        intention of advancing to the cecum. The scope was                        advanced to the sigmoid colon before the procedure was                        aborted. Medications were given. The quality of the                        bowel preparation was poor. The colonoscopy was                        unusually difficult due to poor bowel prep and poor                        endoscopic visualization. Findings:      A large amount of semi-liquid stool was found in the rectum and in the       distal sigmoid colon, precluding visualization. Impression:           - Preparation of the colon was poor.                       - Stool in  the rectum and in the distal sigmoid colon.                       - No specimens collected. Recommendation:       - Put patient on a clear liquid diet starting  today.                       - reprep and reschedule. Procedure Code(s):    --- Professional ---                       [phone], 57, Colonoscopy, flexible; diagnostic, including                        collection of specimen(s) by brushing or washing, when                        performed (separate procedure) Diagnosis Code(s):    --- Professional ---                       Z86.010, Personal history of colonic polyps CPT copyright 2016 American Medical Association. All rights reserved. The codes documented in this report are preliminary and upon coder review may  be revised to meet current compliance requirements. Lollie Sails, MD 02/28/2016 11:04:03 AM This report has been signed electronically. Number of Addenda: 0 Note Initiated On: 02/28/2016 10:29 AM Total Procedure Duration: 0 hours 1 minute 22 seconds       [facility]

## 2016-02-28 NOTE — Anesthesia Preprocedure Evaluation (Addendum)
Anesthesia Evaluation  Patient identified by MRN, date of birth, ID band Patient awake    Reviewed: Allergy & Precautions, H&P , NPO status , Patient's Chart, lab work & pertinent test results, reviewed documented beta blocker date and time   Airway Mallampati: II   Neck ROM: full    Dental  (+) Poor Dentition   Pulmonary neg pulmonary ROS, neg shortness of breath, former smoker,    Pulmonary exam normal        Cardiovascular hypertension, negative cardio ROS Normal cardiovascular exam     Neuro/Psych PSYCHIATRIC DISORDERS negative neurological ROS  negative psych ROS   GI/Hepatic negative GI ROS, Neg liver ROS,   Endo/Other  negative endocrine ROSMorbid obesity  Renal/GU negative Renal ROS  negative genitourinary   Musculoskeletal   Abdominal   Peds  Hematology negative hematology ROS (+)   Anesthesia Other Findings Past Medical History:   Schizophrenia ([place])                                          Obesity                                                      Hypertension                                                 Shortness of breath dyspnea                                  Depression                                                   Tubular adenoma of colon                                   Past Surgical History:   REMOVAL OF GASTROINTESTINAL STOMATIC  TUMOR OF*               COLONOSCOPY WITH PROPOFOL                       N/A 01/17/2016      Comment:Procedure: COLONOSCOPY WITH PROPOFOL;  Surgeon:              Lollie Sails, MD;  Location: [facility]               ENDOSCOPY;  Service: Endoscopy;  Laterality:               N/A; BMI    Body Mass Index   46.79 kg/m 2     Reproductive/Obstetrics                             Anesthesia Physical Anesthesia Plan  ASA: III  Anesthesia Plan: General   Post-op Pain Management:    Induction:   Airway Management Planned:    Additional Equipment:   Intra-op Plan:   Post-operative Plan:   Informed Consent: I have reviewed the patients History and Physical, chart, labs and discussed the procedure including the risks, benefits and alternatives for the proposed anesthesia with the patient or authorized representative who has indicated his/her understanding and acceptance.   Dental Advisory Given  Plan Discussed with: CRNA  Anesthesia Plan Comments:         Anesthesia Quick Evaluation

## 2016-02-28 NOTE — Anesthesia Procedure Notes (Signed)
Date/Time: 02/28/2016 10:46 AM Performed by: Johnna Acosta Pre-anesthesia Checklist: Patient identified, Emergency Drugs available, Suction available, Patient being monitored and Timeout performed Patient Re-evaluated:Patient Re-evaluated prior to inductionOxygen Delivery Method: Nasal cannula

## 2016-02-28 NOTE — H&P (Signed)
Outpatient short stay form Pre-procedure 02/28/2016 10:29 AM Lollie Sails MD  Primary Physician: [facility]  Reason for visit:  Colonoscopy  History of present illness:  Patient is a 65 year old female presenting today for colonoscopy. She has a personal history of adenomatous colon polyps. She presented initially now 01/17/2016 however the prep was poor and I was unable to visualize adequately. She takes 81 mg aspirin. No other anticoagulation medications.    Current facility-administered medications:  .  0.9 %  sodium chloride infusion, , Intravenous, Continuous, Lollie Sails, MD, Last Rate: 20 mL/hr at 02/28/16 1019, 1,000 mL at 02/28/16 1019 .  0.9 %  sodium chloride infusion, , Intravenous, Continuous, Lollie Sails, MD  Prescriptions prior to admission  Medication Sig Dispense Refill Last Dose  . albuterol (PROVENTIL HFA;VENTOLIN HFA) 108 (90 Base) MCG/ACT inhaler Inhale into the lungs every 6 (six) hours as needed for wheezing or shortness of breath.   Past Week at Unknown time  . aspirin EC 81 MG tablet Take 81 mg by mouth daily.   Past Week at Unknown time  . docusate sodium (COLACE) 100 MG capsule Take 100 mg by mouth 2 (two) times daily.   02/28/2016 at 0730  . ergocalciferol (VITAMIN D2) 50000 units capsule Take 50,000 Units by mouth once a week.   Past Week at Unknown time  . isosorbide mononitrate (IMDUR) 60 MG 24 hr tablet Take 60 mg by mouth daily.   02/28/2016 at 0730  . lisinopril (PRINIVIL,ZESTRIL) 20 MG tablet Take 20 mg by mouth daily.   02/28/2016 at 0730  . lurasidone (LATUDA) 80 MG TABS tablet Take 80 mg by mouth daily with breakfast.   Past Week at Unknown time  . metoprolol tartrate (LOPRESSOR) 25 MG tablet Take 25 mg by mouth 2 (two) times daily.   02/28/2016 at 0730  . polyethylene glycol-electrolytes (NULYTELY/GOLYTELY) 420 g solution Take 4,000 mLs by mouth once.   02/27/2016 at Unknown time  . triamcinolone (KENALOG) 0.025 % cream Apply 1  application topically 2 (two) times daily.   Past Week at Unknown time  . vitamin E 400 UNIT capsule Take 400 Units by mouth daily.   Past Week at Unknown time     No Known Allergies   Past Medical History  Diagnosis Date  . Schizophrenia ([place])   . Obesity   . Hypertension   . Shortness of breath dyspnea   . Depression   . Tubular adenoma of colon     Review of systems:      Physical Exam    Heart and lungs: Regular rate and rhythm without rub or gallop, lungs are clear to auscultation    HEENT: Normocephalic atraumatic eyes are anicteric    Other:     Pertinant exam for procedure: Obese, nontender, unable to palpate internal organs. It is difficult to turn patient on the bed due to body habitus. Bowel sounds positive.    Planned proceedures: Colonoscopy and indicated procedures. I have discussed the risks benefits and complications of procedures to include not limited to bleeding, infection, perforation and the risk of sedation and the patient wishes to proceed.    Lollie Sails, MD Gastroenterology 02/28/2016  10:29 AM

## 2016-02-28 NOTE — Transfer of Care (Signed)
Immediate Anesthesia Transfer of Care Note  Patient: Jill Smith  Procedure(s) Performed: Procedure(s): COLONOSCOPY WITH PROPOFOL (N/A)  Patient Location: PACU  Anesthesia Type:General  Level of Consciousness: awake and sedated  Airway & Oxygen Therapy: Patient Spontanous Breathing and Patient connected to nasal cannula oxygen  Post-op Assessment: Report given to RN and Post -op Vital signs reviewed and stable  Post vital signs: Reviewed and stable  Last Vitals:  Filed Vitals:   02/28/16 0958 02/28/16 1100  BP: 147/83 88/57  Pulse: 83 60  Temp: 36.1 C 36.9 C  Resp: 24 12    Complications: No apparent anesthesia complications

## 2016-02-28 NOTE — Anesthesia Postprocedure Evaluation (Signed)
Anesthesia Post Note  Patient: Jill Smith  Procedure(s) Performed: Procedure(s) (LRB): COLONOSCOPY WITH PROPOFOL (N/A)  Patient location during evaluation: PACU Anesthesia Type: General Level of consciousness: awake and alert Pain management: pain level controlled Vital Signs Assessment: post-procedure vital signs reviewed and stable Respiratory status: spontaneous breathing, nonlabored ventilation, respiratory function stable and patient connected to nasal cannula oxygen Cardiovascular status: blood pressure returned to baseline and stable Postop Assessment: no signs of nausea or vomiting Anesthetic complications: no    Last Vitals:  Filed Vitals:   02/28/16 1100 02/28/16 1102  BP: 88/57 88/57  Pulse: 60 59  Temp: 36.9 C 36.9 C  Resp: 12 18    Last Pain: There were no vitals filed for this visit.               Molli Barrows

## 2016-02-29 ENCOUNTER — Encounter: Payer: Self-pay | Admitting: Anesthesiology

## 2016-02-29 ENCOUNTER — Ambulatory Visit: Payer: Medicare HMO | Admitting: Anesthesiology

## 2016-02-29 ENCOUNTER — Ambulatory Visit
Admission: RE | Admit: 2016-02-29 | Discharge: 2016-02-29 | Disposition: A | Discharge status: Home Health | Payer: Medicare HMO | Source: Ambulatory Visit | Attending: Gastroenterology | Admitting: Gastroenterology

## 2016-02-29 ENCOUNTER — Encounter
Admission: RE | Disposition: A | Discharge status: Home Health | Payer: Self-pay | Source: Ambulatory Visit | Attending: Gastroenterology

## 2016-02-29 DIAGNOSIS — I10 Essential (primary) hypertension: Secondary | ICD-10-CM | POA: Insufficient documentation

## 2016-02-29 DIAGNOSIS — Z86010 Personal history of colonic polyps: Secondary | ICD-10-CM | POA: Diagnosis present

## 2016-02-29 DIAGNOSIS — D124 Benign neoplasm of descending colon: Secondary | ICD-10-CM | POA: Insufficient documentation

## 2016-02-29 DIAGNOSIS — E669 Obesity, unspecified: Secondary | ICD-10-CM | POA: Diagnosis not present

## 2016-02-29 DIAGNOSIS — F329 Major depressive disorder, single episode, unspecified: Secondary | ICD-10-CM | POA: Diagnosis not present

## 2016-02-29 DIAGNOSIS — F209 Schizophrenia, unspecified: Secondary | ICD-10-CM | POA: Diagnosis not present

## 2016-02-29 DIAGNOSIS — Z79899 Other long term (current) drug therapy: Secondary | ICD-10-CM | POA: Diagnosis not present

## 2016-02-29 DIAGNOSIS — Z7982 Long term (current) use of aspirin: Secondary | ICD-10-CM | POA: Insufficient documentation

## 2016-02-29 DIAGNOSIS — Z1211 Encounter for screening for malignant neoplasm of colon: Secondary | ICD-10-CM | POA: Diagnosis not present

## 2016-02-29 SURGERY — Surgical Case
Anesthesia: General

## 2016-02-29 MED ORDER — KETAMINE HCL 50 MG/ML IJ SOLN
INTRAMUSCULAR | Status: DC | PRN
  Administered 2016-02-29: 15 mg via INTRAMUSCULAR

## 2016-02-29 MED ORDER — SODIUM CHLORIDE 0.9 % IV SOLN: INTRAVENOUS | Status: DC

## 2016-02-29 MED ORDER — LIDOCAINE HCL (CARDIAC) 20 MG/ML IV SOLN
INTRAVENOUS | Status: DC | PRN
  Administered 2016-02-29: 100 mg via INTRAVENOUS

## 2016-02-29 MED ORDER — METOPROLOL TARTRATE 25 MG PO TABS: 25.0000 mg | ORAL_TABLET | Freq: Once | ORAL | Status: DC

## 2016-02-29 MED ORDER — METOPROLOL TARTRATE 25 MG PO TABS
ORAL_TABLET | ORAL | Status: AC
  Filled 2016-02-29: qty 1

## 2016-02-29 MED ORDER — PROPOFOL 500 MG/50ML IV EMUL
INTRAVENOUS | Status: DC | PRN
  Administered 2016-02-29: 100 ug/kg/min via INTRAVENOUS

## 2016-02-29 MED ORDER — PROPOFOL 10 MG/ML IV BOLUS
INTRAVENOUS | Status: DC | PRN
  Administered 2016-02-29: 40 mg via INTRAVENOUS

## 2016-02-29 MED ORDER — ALBUTEROL SULFATE HFA 108 (90 BASE) MCG/ACT IN AERS
INHALATION_SPRAY | RESPIRATORY_TRACT | Status: DC | PRN
  Administered 2016-02-29: 3 via RESPIRATORY_TRACT

## 2016-02-29 MED ORDER — SODIUM CHLORIDE 0.9 % IV SOLN
INTRAVENOUS | Status: DC
  Administered 2016-02-29: 1000 mL via INTRAVENOUS

## 2016-02-29 NOTE — Transfer of Care (Signed)
Immediate Anesthesia Transfer of Care Note  Patient: Jill Smith  Procedure(s) Performed: * No procedures listed *  Patient Location: PACU and Endoscopy Unit  Anesthesia Type:General  Level of Consciousness: sedated  Airway & Oxygen Therapy: Patient Spontanous Breathing and Patient connected to nasal cannula oxygen  Post-op Assessment: Report given to RN and Post -op Vital signs reviewed and stable  Post vital signs: Reviewed and stable  Last Vitals:  Filed Vitals:   02/29/16 1518 02/29/16 1522  BP:  139/96  Pulse: 67 65  Temp: 35.9 C 35.9 C  Resp: 16 15    Complications: No apparent anesthesia complications

## 2016-02-29 NOTE — Op Note (Signed)
[facility] Gastroenterology Patient Name: Jill Smith Procedure Date: 02/29/2016 2:16 PM MRN: [id number] Account #: [id number] Date of Birth: April 01, 1951 Admit Type: Outpatient Age: 65 Room: [facility] ENDO ROOM 1 Gender: Female Note Status: Finalized Procedure:            Colonoscopy Indications:          Personal history of colonic polyps Providers:            Lollie Sails, MD Referring MD:         Dr Juanda [facility], MD (Referring MD) Medicines:            Monitored Anesthesia Care Complications:        No immediate complications. Procedure:            Pre-Anesthesia Assessment:                       - ASA Grade Assessment: III - A patient with severe                        systemic disease.                       After obtaining informed consent, the colonoscope was                        passed under direct vision. Throughout the procedure,                        the patient's blood pressure, pulse, and oxygen                        saturations were monitored continuously. The                        Colonoscope was introduced through the anus and                        advanced to the the cecum, identified by appendiceal                        orifice and ileocecal valve. The colonoscopy was                        unusually difficult due to a tortuous colon and the                        patient's body habitus, unable to rerposition patient.                        Successful completion of the procedure was aided by                        using manual pressure. Findings:      A 2 mm polyp was found in the descending colon. The polyp was sessile.       The polyp was removed with a cold biopsy forceps. Resection and       retrieval were complete.      The exam was otherwise normal throughout the examined colon. I was       unable to fully intubate the cecum, however normal in  appearance from       appearance.      The retroflexed view of the distal rectum  and anal verge was normal and       showed no anal or rectal abnormalities.      The digital rectal exam was normal. Impression:           - One 2 mm polyp in the descending colon, removed with                        a cold biopsy forceps. Resected and retrieved.                       - The distal rectum and anal verge are normal on                        retroflexion view. Recommendation:       - Discharge patient to home.                       - Await pathology results.                       - Telephone GI clinic for pathology results in 1 week. Procedure Code(s):    --- Professional ---                       [phone], Colonoscopy, flexible; with biopsy, single or                        multiple Diagnosis Code(s):    --- Professional ---                       D12.4, Benign neoplasm of descending colon                       Z86.010, Personal history of colonic polyps CPT copyright 2016 American Medical Association. All rights reserved. The codes documented in this report are preliminary and upon coder review may  be revised to meet current compliance requirements. Lollie Sails, MD 02/29/2016 3:19:02 PM This report has been signed electronically. Number of Addenda: 0 Note Initiated On: 02/29/2016 2:16 PM Scope Withdrawal Time: 0 hours 5 minutes 5 seconds  Total Procedure Duration: 0 hours 31 minutes 19 seconds       [facility]

## 2016-02-29 NOTE — H&P (Signed)
Outpatient short stay form Pre-procedure 02/29/2016 2:24 PM Lollie Sails MD  Primary Physician: [facility]  Reason for visit:  Colonoscopy  History of present illness:  Patient is a 65 year old female presenting today for colonoscopy. She actually came in yesterday with for this procedure however she was poorly prepped and the procedure was not completed. Presenting today with a repeat prep overnight. She takes an 81 mg aspirin. She takes no other anticoagulation medications or aspirin products.    Current facility-administered medications:  .  0.9 %  sodium chloride infusion, , Intravenous, Continuous, Lollie Sails, MD, Last Rate: 20 mL/hr at 02/29/16 1338, 1,000 mL at 02/29/16 1338 .  0.9 %  sodium chloride infusion, , Intravenous, Continuous, Lollie Sails, MD .  metoprolol tartrate (LOPRESSOR) 25 MG tablet, , , ,  .  metoprolol tartrate (LOPRESSOR) tablet 25 mg, 25 mg, Oral, Once, Lollie Sails, MD  Prescriptions prior to admission  Medication Sig Dispense Refill Last Dose  . metoprolol tartrate (LOPRESSOR) 25 MG tablet Take 25 mg by mouth 2 (two) times daily.   02/29/2016 at 1350nown time  . albuterol (PROVENTIL HFA;VENTOLIN HFA) 108 (90 Base) MCG/ACT inhaler Inhale into the lungs every 6 (six) hours as needed for wheezing or shortness of breath.   Past Week at Unknown time  . aspirin EC 81 MG tablet Take 81 mg by mouth daily.   Past Week at Unknown time  . docusate sodium (COLACE) 100 MG capsule Take 100 mg by mouth 2 (two) times daily.   02/28/2016 at 0730  . ergocalciferol (VITAMIN D2) 50000 units capsule Take 50,000 Units by mouth once a week.   Past Week at Unknown time  . isosorbide mononitrate (IMDUR) 60 MG 24 hr tablet Take 60 mg by mouth daily.   02/28/2016 at 0730  . lisinopril (PRINIVIL,ZESTRIL) 20 MG tablet Take 20 mg by mouth daily.   02/28/2016 at 0730  . lurasidone (LATUDA) 80 MG TABS tablet Take 80 mg by mouth daily with breakfast.   Past Week at  Unknown time  . polyethylene glycol-electrolytes (NULYTELY/GOLYTELY) 420 g solution Take 4,000 mLs by mouth once.   02/27/2016 at Unknown time  . triamcinolone (KENALOG) 0.025 % cream Apply 1 application topically 2 (two) times daily.   Past Week at Unknown time  . vitamin E 400 UNIT capsule Take 400 Units by mouth daily.   Past Week at Unknown time     No Known Allergies   Past Medical History  Diagnosis Date  . Schizophrenia ([place])   . Obesity   . Hypertension   . Shortness of breath dyspnea   . Depression   . Tubular adenoma of colon     Review of systems:      Physical Exam    Heart and lungs: Regular rate and rhythm without rub or gallop, lungs are bilaterally clear.    HEENT: Normocephalic atraumatic eyes are anicteric    Other:     Pertinant exam for procedure: Obese nontender bowel sounds positive is no distention.    Planned proceedures: Anoscopy and indicated procedures. I have discussed the risks benefits and complications of procedures to include not limited to bleeding, infection, perforation and the risk of sedation and the patient wishes to proceed.    Lollie Sails, MD Gastroenterology 02/29/2016  2:24 PM

## 2016-02-29 NOTE — Anesthesia Preprocedure Evaluation (Addendum)
Anesthesia Evaluation  Patient identified by MRN, date of birth, ID band Patient awake    Reviewed: Allergy & Precautions, NPO status , Patient's Chart, lab work & pertinent test results, reviewed documented beta blocker date and time   Airway Mallampati: III  TM Distance: >3 FB     Dental  (+) Chipped   Pulmonary shortness of breath, former smoker,           Cardiovascular hypertension, Pt. on medications and Pt. on home beta blockers      Neuro/Psych PSYCHIATRIC DISORDERS Depression Schizophrenia    GI/Hepatic   Endo/Other  Morbid obesity  Renal/GU      Musculoskeletal   Abdominal   Peds  Hematology   Anesthesia Other Findings Old EKG reviewed and OK.  Reproductive/Obstetrics                            Anesthesia Physical Anesthesia Plan  ASA: III  Anesthesia Plan: General   Post-op Pain Management:    Induction: Intravenous  Airway Management Planned: Nasal Cannula  Additional Equipment:   Intra-op Plan:   Post-operative Plan:   Informed Consent: I have reviewed the patients History and Physical, chart, labs and discussed the procedure including the risks, benefits and alternatives for the proposed anesthesia with the patient or authorized representative who has indicated his/her understanding and acceptance.     Plan Discussed with: CRNA  Anesthesia Plan Comments:         Anesthesia Quick Evaluation

## 2016-02-29 NOTE — Anesthesia Postprocedure Evaluation (Signed)
Anesthesia Post Note  Patient: Jill Smith  Procedure(s) Performed: * No procedures listed *  Patient location during evaluation: Endoscopy Anesthesia Type: General Level of consciousness: awake and alert Pain management: pain level controlled Vital Signs Assessment: post-procedure vital signs reviewed and stable Respiratory status: spontaneous breathing, nonlabored ventilation, respiratory function stable and patient connected to nasal cannula oxygen Cardiovascular status: blood pressure returned to baseline and stable Postop Assessment: no signs of nausea or vomiting Anesthetic complications: no    Last Vitals:  Filed Vitals:   02/29/16 1528 02/29/16 1543  BP: 133/70 137/76  Pulse: 63 53  Temp:    Resp: 17 16    Last Pain: There were no vitals filed for this visit.               Toluwani Ruder S

## 2016-03-02 LAB — SURGICAL PATHOLOGY

## 2017-11-26 ENCOUNTER — Other Ambulatory Visit: Payer: Self-pay | Admitting: Internal Medicine

## 2017-11-26 DIAGNOSIS — Z1231 Encounter for screening mammogram for malignant neoplasm of breast: Secondary | ICD-10-CM

## 2017-12-18 ENCOUNTER — Ambulatory Visit
Admission: RE | Admit: 2017-12-18 | Discharge: 2017-12-18 | Disposition: A | Discharge status: Home / Self Care | Payer: Medicare HMO | Source: Ambulatory Visit | Attending: Internal Medicine | Admitting: Internal Medicine

## 2017-12-18 DIAGNOSIS — Z1231 Encounter for screening mammogram for malignant neoplasm of breast: Secondary | ICD-10-CM | POA: Insufficient documentation

## 2019-01-09 ENCOUNTER — Other Ambulatory Visit: Payer: Self-pay | Admitting: Family Medicine

## 2019-01-09 DIAGNOSIS — Z1231 Encounter for screening mammogram for malignant neoplasm of breast: Secondary | ICD-10-CM

## 2019-01-09 DIAGNOSIS — Z780 Asymptomatic menopausal state: Secondary | ICD-10-CM

## 2019-02-09 ENCOUNTER — Other Ambulatory Visit: Payer: Self-pay

## 2019-02-09 ENCOUNTER — Emergency Department: Payer: Medicare HMO

## 2019-02-09 ENCOUNTER — Observation Stay
Admission: EM | Admit: 2019-02-09 | Discharge: 2019-02-10 | Disposition: A | Discharge status: Home / Self Care | Payer: Medicare HMO | Attending: Internal Medicine | Admitting: Internal Medicine

## 2019-02-09 ENCOUNTER — Observation Stay: Payer: Medicare HMO

## 2019-02-09 DIAGNOSIS — R262 Difficulty in walking, not elsewhere classified: Secondary | ICD-10-CM | POA: Insufficient documentation

## 2019-02-09 DIAGNOSIS — M6281 Muscle weakness (generalized): Secondary | ICD-10-CM | POA: Diagnosis not present

## 2019-02-09 DIAGNOSIS — F209 Schizophrenia, unspecified: Secondary | ICD-10-CM | POA: Insufficient documentation

## 2019-02-09 DIAGNOSIS — I10 Essential (primary) hypertension: Secondary | ICD-10-CM | POA: Diagnosis not present

## 2019-02-09 DIAGNOSIS — Z7982 Long term (current) use of aspirin: Secondary | ICD-10-CM | POA: Insufficient documentation

## 2019-02-09 DIAGNOSIS — M545 Low back pain: Secondary | ICD-10-CM | POA: Insufficient documentation

## 2019-02-09 DIAGNOSIS — F319 Bipolar disorder, unspecified: Secondary | ICD-10-CM | POA: Insufficient documentation

## 2019-02-09 DIAGNOSIS — R4182 Altered mental status, unspecified: Secondary | ICD-10-CM | POA: Diagnosis not present

## 2019-02-09 DIAGNOSIS — W19XXXA Unspecified fall, initial encounter: Secondary | ICD-10-CM

## 2019-02-09 DIAGNOSIS — I700 Atherosclerosis of aorta: Secondary | ICD-10-CM | POA: Diagnosis not present

## 2019-02-09 DIAGNOSIS — Z87891 Personal history of nicotine dependence: Secondary | ICD-10-CM | POA: Insufficient documentation

## 2019-02-09 DIAGNOSIS — M549 Dorsalgia, unspecified: Secondary | ICD-10-CM | POA: Diagnosis present

## 2019-02-09 DIAGNOSIS — Z79899 Other long term (current) drug therapy: Secondary | ICD-10-CM | POA: Diagnosis not present

## 2019-02-09 HISTORY — DX: Bipolar disorder, unspecified: F31.9

## 2019-02-09 LAB — URINALYSIS, COMPLETE (UACMP) WITH MICROSCOPIC
Bacteria, UA: NONE SEEN
Bilirubin Urine: NEGATIVE
Glucose, UA: NEGATIVE mg/dL
Ketones, ur: 5 mg/dL — AB
Leukocytes, UA: NEGATIVE
NITRITE: NEGATIVE
Protein, ur: 30 mg/dL — AB
Specific Gravity, Urine: 1.026 (ref 1.005–1.030)
pH: 5 (ref 5.0–8.0)

## 2019-02-09 LAB — COMPREHENSIVE METABOLIC PANEL
ALK PHOS: 72 U/L (ref 38–126)
ALT: 22 U/L (ref 0–44)
AST: 44 U/L — ABNORMAL HIGH (ref 15–41)
Albumin: 3.8 g/dL (ref 3.5–5.0)
Anion gap: 7 (ref 5–15)
BUN: 17 mg/dL (ref 8–23)
CO2: 25 mmol/L (ref 22–32)
Calcium: 10 mg/dL (ref 8.9–10.3)
Chloride: 101 mmol/L (ref 98–111)
Creatinine, Ser: 1.09 mg/dL — ABNORMAL HIGH (ref 0.44–1.00)
GFR calc Af Amer: >60 mL/min (ref >60)
GFR calc non Af Amer: 52 mL/min — ABNORMAL LOW (ref >60)
Glucose, Bld: 170 mg/dL — ABNORMAL HIGH (ref 70–99)
Potassium: 3.4 mmol/L — ABNORMAL LOW (ref 3.5–5.1)
SODIUM: 133 mmol/L — AB (ref 135–145)
Total Bilirubin: 0.7 mg/dL (ref 0.3–1.2)
Total Protein: 6.4 g/dL — ABNORMAL LOW (ref 6.5–8.1)

## 2019-02-09 LAB — URINE DRUG SCREEN, QUALITATIVE (ARMC ONLY)
Amphetamines, Ur Screen: NOT DETECTED
Barbiturates, Ur Screen: NOT DETECTED
Benzodiazepine, Ur Scrn: NOT DETECTED
Cannabinoid 50 Ng, Ur ~~LOC~~: NOT DETECTED
Cocaine Metabolite,Ur ~~LOC~~: NOT DETECTED
MDMA (Ecstasy)Ur Screen: NOT DETECTED
METHADONE SCREEN, URINE: NOT DETECTED
Opiate, Ur Screen: NOT DETECTED
Phencyclidine (PCP) Ur S: NOT DETECTED
Tricyclic, Ur Screen: NOT DETECTED

## 2019-02-09 LAB — TROPONIN I

## 2019-02-09 LAB — CBC WITH DIFFERENTIAL/PLATELET
Abs Immature Granulocytes: 0.02 10*3/uL (ref 0.00–0.07)
Basophils Absolute: 0 10*3/uL (ref 0.0–0.1)
Basophils Relative: 0 %
EOS ABS: 0 10*3/uL (ref 0.0–0.5)
Eosinophils Relative: 1 %
HEMATOCRIT: 43 % (ref 36.0–46.0)
Hemoglobin: 13.8 g/dL (ref 12.0–15.0)
IMMATURE GRANULOCYTES: 0 %
LYMPHS ABS: 1.4 10*3/uL (ref 0.7–4.0)
Lymphocytes Relative: 21 %
MCH: 27.5 pg (ref 26.0–34.0)
MCHC: 32.1 g/dL (ref 30.0–36.0)
MCV: 85.8 fL (ref 80.0–100.0)
Monocytes Absolute: 0.6 10*3/uL (ref 0.1–1.0)
Monocytes Relative: 9 %
Neutro Abs: 4.5 10*3/uL (ref 1.7–7.7)
Neutrophils Relative %: 69 %
Platelets: 213 10*3/uL (ref 150–400)
RBC: 5.01 MIL/uL (ref 3.87–5.11)
RDW: 12.6 % (ref 11.5–15.5)
WBC: 6.5 10*3/uL (ref 4.0–10.5)
nRBC: 0 % (ref 0.0–0.2)

## 2019-02-09 LAB — PROTIME-INR
INR: 1.03
Prothrombin Time: 13.4 seconds (ref 11.4–15.2)

## 2019-02-09 LAB — HEMOGLOBIN A1C
Hgb A1c MFr Bld: 5.9 % — ABNORMAL HIGH (ref 4.8–5.6)
MEAN PLASMA GLUCOSE: 122.63 mg/dL

## 2019-02-09 LAB — GLUCOSE, CAPILLARY
Glucose-Capillary: 131 mg/dL — ABNORMAL HIGH (ref 70–99)
Glucose-Capillary: 110 mg/dL — ABNORMAL HIGH (ref 70–99)

## 2019-02-09 LAB — APTT: aPTT: 28 seconds (ref 24–36)

## 2019-02-09 LAB — ETHANOL: Alcohol, Ethyl (B): <10 mg/dL (ref <10)

## 2019-02-09 MED ORDER — AMLODIPINE BESYLATE 5 MG PO TABS
5.0000 mg | ORAL_TABLET | Freq: Every day | ORAL | Status: DC
  Administered 2019-02-10: 5 mg via ORAL
  Filled 2019-02-09: qty 1

## 2019-02-09 MED ORDER — HYDROCODONE-ACETAMINOPHEN 5-325 MG PO TABS: 1.0000 | ORAL_TABLET | ORAL | Status: DC | PRN

## 2019-02-09 MED ORDER — LISINOPRIL 20 MG PO TABS
20.0000 mg | ORAL_TABLET | Freq: Every day | ORAL | Status: DC
  Administered 2019-02-10: 20 mg via ORAL
  Filled 2019-02-09: qty 1

## 2019-02-09 MED ORDER — ACETAMINOPHEN 650 MG RE SUPP: 650.0000 mg | Freq: Four times a day (QID) | RECTAL | Status: DC | PRN

## 2019-02-09 MED ORDER — ONDANSETRON HCL 4 MG PO TABS: 4.0000 mg | ORAL_TABLET | Freq: Four times a day (QID) | ORAL | Status: DC | PRN

## 2019-02-09 MED ORDER — TRAMADOL HCL 50 MG PO TABS: 50.0000 mg | ORAL_TABLET | Freq: Four times a day (QID) | ORAL | Status: DC | PRN

## 2019-02-09 MED ORDER — ONDANSETRON HCL 4 MG/2ML IJ SOLN: 4.0000 mg | Freq: Four times a day (QID) | INTRAMUSCULAR | Status: DC | PRN

## 2019-02-09 MED ORDER — INSULIN ASPART 100 UNIT/ML ~~LOC~~ SOLN
0.0000 [IU] | Freq: Three times a day (TID) | SUBCUTANEOUS | Status: DC
  Administered 2019-02-09 – 2019-02-10 (×2): 1 [IU] via SUBCUTANEOUS
  Filled 2019-02-09: qty 1

## 2019-02-09 MED ORDER — SODIUM CHLORIDE 0.9 % IV SOLN
INTRAVENOUS | Status: DC
  Administered 2019-02-09: 18:00:00 via INTRAVENOUS

## 2019-02-09 MED ORDER — METOPROLOL TARTRATE 50 MG PO TABS
50.0000 mg | ORAL_TABLET | Freq: Two times a day (BID) | ORAL | Status: DC
  Administered 2019-02-09 – 2019-02-10 (×2): 50 mg via ORAL
  Filled 2019-02-09 (×2): qty 1

## 2019-02-09 MED ORDER — POTASSIUM CHLORIDE CRYS ER 20 MEQ PO TBCR: 40.0000 meq | EXTENDED_RELEASE_TABLET | Freq: Once | ORAL | Status: DC

## 2019-02-09 MED ORDER — DOCUSATE SODIUM 100 MG PO CAPS: 100.0000 mg | ORAL_CAPSULE | Freq: Every day | ORAL | Status: DC | PRN

## 2019-02-09 MED ORDER — METHOCARBAMOL 500 MG PO TABS
500.0000 mg | ORAL_TABLET | Freq: Three times a day (TID) | ORAL | Status: DC
  Administered 2019-02-09 – 2019-02-10 (×3): 500 mg via ORAL
  Filled 2019-02-09 (×3): qty 1

## 2019-02-09 MED ORDER — ACETAMINOPHEN 325 MG PO TABS: 650.0000 mg | ORAL_TABLET | Freq: Four times a day (QID) | ORAL | Status: DC | PRN

## 2019-02-09 MED ORDER — VITAMIN E 180 MG (400 UNIT) PO CAPS
800.0000 [IU] | ORAL_CAPSULE | Freq: Two times a day (BID) | ORAL | Status: DC
  Administered 2019-02-09: 800 [IU] via ORAL
  Filled 2019-02-09 (×3): qty 2

## 2019-02-09 MED ORDER — ISOSORBIDE MONONITRATE ER 30 MG PO TB24
60.0000 mg | ORAL_TABLET | Freq: Every day | ORAL | Status: DC
  Administered 2019-02-10: 60 mg via ORAL
  Filled 2019-02-09: qty 2

## 2019-02-09 MED ORDER — ASPIRIN EC 81 MG PO TBEC
81.0000 mg | DELAYED_RELEASE_TABLET | Freq: Every day | ORAL | Status: DC
  Administered 2019-02-10: 81 mg via ORAL
  Filled 2019-02-09: qty 1

## 2019-02-09 MED ORDER — INSULIN ASPART 100 UNIT/ML ~~LOC~~ SOLN: 0.0000 [IU] | Freq: Every day | SUBCUTANEOUS | Status: DC

## 2019-02-09 MED ORDER — ENOXAPARIN SODIUM 40 MG/0.4ML ~~LOC~~ SOLN
40.0000 mg | Freq: Two times a day (BID) | SUBCUTANEOUS | Status: DC
  Administered 2019-02-09 – 2019-02-10 (×2): 40 mg via SUBCUTANEOUS
  Filled 2019-02-09 (×2): qty 0.4

## 2019-02-09 NOTE — H&P (Signed)
[facility] [place] NAME: Jill Smith    MR#:  [id number]  DATE OF BIRTH:  07/23/1951  DATE OF ADMISSION:  02/09/2019  PRIMARY CARE PHYSICIAN: [facility] [place]   REQUESTING/REFERRING PHYSICIAN: Dr. Lavonia Drafts  CHIEF COMPLAINT:   Chief Complaint  Patient presents with  . Fall    HISTORY OF PRESENT ILLNESS:  Katharina Jehle  is a 68 y.o. female with a known history of bipolar disorder, schizophrenia, morbid obesity, hypertension presents to hospital after a fall and low back pain. Patient is a poor historian, she has been staying with her sister for almost 35 years now.  Sister is at bedside and provides most of the history.  At baseline patient is able to ambulate with a walker, she has trouble with her weight and also from arthritis.  Early this morning at some point patient rolled off from the bed and had a fall onto the floor.  She could not get up by herself and laid on the floor until this morning.  Since this morning she complains of low back pain and inability to walk because of the pain.  Also she has some swelling and pain of her right wrist.  Denies any fevers or chills.  No nausea or vomiting or abdominal pain.  No recent illnesses.  No chest pain.  Patient states that she has not lost consciousness after the fall as she did not hit her head. Urine analysis is negative for infection here.  Labs are almost within normal limits except for low potassium of 3.4.  CT of the head without any acute findings.  X-rays of lumbar spine and the right wrist are pending.   PAST MEDICAL HISTORY:   Past Medical History:  Diagnosis Date  . Bipolar disorder ([place])   . Depression   . Hypertension   . Obesity   . Schizophrenia ([place])   . Shortness of breath dyspnea   . Tubular adenoma of colon     PAST SURGICAL HISTORY:   Past Surgical History:  Procedure Laterality Date  . COLONOSCOPY WITH PROPOFOL N/A 01/17/2016   Procedure: COLONOSCOPY WITH PROPOFOL;  Surgeon: Lollie Sails, MD;  Location: [facility] ENDOSCOPY;  Service: Endoscopy;  Laterality: N/A;  . COLONOSCOPY WITH PROPOFOL N/A 02/28/2016   Procedure: COLONOSCOPY WITH PROPOFOL;  Surgeon: Lollie Sails, MD;  Location: [facility] ENDOSCOPY;  Service: Endoscopy;  Laterality: N/A;  . 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 Smoking status: Former Smoker    Types: Cigarettes  . Smokeless tobacco: Never Used  Substance Use Topics  . Alcohol use: No    FAMILY HISTORY:   Family History  Problem Relation Age of Onset  . Diabetes Mother   . Hypertension Mother   . Diabetes Father   . Prostate cancer Father   . Breast cancer Neg Hx     DRUG ALLERGIES:  No Known Allergies  REVIEW OF SYSTEMS:   Review of Systems  Constitutional: Positive for malaise/fatigue. Negative for chills, fever and weight loss.  HENT: Negative for ear discharge, ear pain, hearing loss and nosebleeds.   Eyes: Positive for blurred vision. Negative for double vision and photophobia.  Respiratory: Negative for cough, hemoptysis, shortness of breath and wheezing.   Cardiovascular: Negative for chest pain, palpitations, orthopnea and leg swelling.  Gastrointestinal: Negative for abdominal pain, constipation, diarrhea, heartburn,  melena, nausea and vomiting.  Genitourinary: Negative for dysuria, frequency, hematuria and urgency.  Musculoskeletal: Positive for back pain, falls, joint pain and myalgias. Negative for neck pain.  Skin: Negative for rash.  Neurological: Negative for dizziness, tingling, tremors, sensory change, speech change, focal weakness and headaches.  Endo/Heme/Allergies: Does not bruise/bleed easily.  Psychiatric/Behavioral: Negative for depression.    MEDICATIONS AT HOME:   Prior to Admission medications   Medication Sig Start Date End Date Taking? Authorizing Provider  amLODipine  (NORVASC) 5 MG tablet Take 5 mg by mouth daily.   Yes [provider]  aspirin EC 81 MG tablet Take 81 mg by mouth daily.   Yes [provider]  docusate sodium (COLACE) 100 MG capsule Take 100 mg by mouth daily as needed for mild constipation.    Yes [provider]  isosorbide mononitrate (IMDUR) 60 MG 24 hr tablet Take 60 mg by mouth daily.   Yes [provider]  lisinopril (PRINIVIL,ZESTRIL) 20 MG tablet Take 20 mg by mouth daily.   Yes [provider]  metoprolol tartrate (LOPRESSOR) 50 MG tablet Take 50 mg by mouth 2 (two) times daily.    Yes [provider]  paliperidone (INVEGA SUSTENNA) 234 MG/1.5ML SUSY injection Inject 234 mg into the muscle every 30 (thirty) days.   Yes [provider]  vitamin E 400 UNIT capsule Take 800 Units by mouth 2 (two) times daily.    Yes [provider]      VITAL SIGNS:  Blood pressure 123/77, pulse 74, resp. rate 13, height 5\' 9"  (1.753 m), weight (!) 143.8 kg, SpO2 100 %.  PHYSICAL EXAMINATION:   Physical Exam  GENERAL:  68 y.o.-year-old morbidly obese patient lying in the bed with no acute distress.  EYES: Pupils equal, round, reactive to light and accommodation. No scleral icterus. Extraocular muscles intact.  HEENT: Head atraumatic, normocephalic. Oropharynx and nasopharynx clear.  NECK:  Supple, no jugular venous distention. No thyroid enlargement, no tenderness.  LUNGS: Normal breath sounds bilaterally, no wheezing, rales,rhonchi or crepitation. No use of accessory muscles of respiration. Decreased bibasilar breath sounds CARDIOVASCULAR: S1, S2 normal. No murmurs, rubs, or gallops.  ABDOMEN: Soft, obese, nontender, nondistended. Bowel sounds present. No organomegaly or mass.  EXTREMITIES: No cyanosis, or clubbing. 1+ pedal edema present NEUROLOGIC: Cranial nerves II through XII are intact. Muscle strength 5/5 in all extremities-she is able to move all extremities in bed.   Straight leg raising test in both lower extremities causing pain in the lower back.  Sensation intact. Gait not checked. Global weakness noted. PSYCHIATRIC: The patient is alert and oriented x 3.  SKIN: No obvious rash, lesion, or ulcer.   LABORATORY PANEL:   CBC Recent Labs  Lab 02/09/19 1035  WBC 6.5  HGB 13.8  HCT 43.0  PLT 213   ------------------------------------------------------------------------------------------------------------------  Chemistries  Recent Labs  Lab 02/09/19 1035  NA 133*  K 3.4*  CL 101  CO2 25  GLUCOSE 170*  BUN 17  CREATININE 1.09*  CALCIUM 10.0  AST 44*  ALT 22  ALKPHOS 72  BILITOT 0.7   ------------------------------------------------------------------------------------------------------------------  Cardiac Enzymes Recent Labs  Lab 02/09/19 1035  TROPONINI <0.03   ------------------------------------------------------------------------------------------------------------------  RADIOLOGY:  Ct Head Wo Contrast  Result Date: 02/09/2019 CLINICAL DATA:  Unwitnessed fall last night. EXAM: CT HEAD WITHOUT CONTRAST TECHNIQUE: Contiguous axial images were obtained from the base of the skull through the vertex without intravenous contrast. COMPARISON:  05/09/2012 FINDINGS: Brain: No sign of acute  infarction. There chronic small-vessel ischemic changes of the cerebral hemispheric white matter. No mass lesion, hemorrhage, hydrocephalus or extra-axial collection. Vascular: There is atherosclerotic calcification of the major vessels at the base of the brain. Skull: Negative Sinuses/Orbits: Clear/normal Other: Metal fragments lateral to the right mastoid region. IMPRESSION: No acute or traumatic finding. Chronic small-vessel ischemic changes of the cerebral hemispheric white matter. Electronically Signed   By: Nelson Chimes M.D.   On: 02/09/2019 11:13   Dg Chest Port 1 View  Result Date: 02/09/2019 CLINICAL DATA:  Altered mental status. EXAM:  PORTABLE CHEST 1 VIEW COMPARISON:  05/09/2012 FINDINGS: Artifact overlies the chest. Heart size is normal. There is atherosclerosis of the aorta. The pulmonary vascularity is normal. The lungs are clear. No effusions. No significant bone finding. IMPRESSION: No active disease. Electronically Signed   By: Nelson Chimes M.D.   On: 02/09/2019 11:14    EKG:   Orders placed or performed during the hospital encounter of 02/09/19  . ED EKG  . ED EKG    IMPRESSION AND PLAN:   Rayma Hegg  is a 68 y.o. female with a known history of bipolar disorder, schizophrenia, morbid obesity, hypertension presents to hospital after a fall and low back pain.  1.  Fall and low back pain-mechanical fall, denies any syncopal episode. -Check CPK.  Check lumbar x-ray and x-ray of the right wrist due to pain and swelling. -Muscle relaxants, heating pad -Physical therapy  2.  Hypokalemia-being replaced.  Gentle IV fluids  3.  Hyperglycemia-no diagnosis of diabetes but sugars are elevated.  Could be from stress.  Check A1c and cover with sliding scale  4.  Hypertension-continue home medications.  Patient on lisinopril, Imdur and metoprolol  5.  Bipolar and schizophrenia-continue her home medications.  She seems to be stable at this time  6.  DVT prophylaxis-Lovenox     All the records are reviewed and case discussed with ED provider. Management plans discussed with the patient, family and they are in agreement.  CODE STATUS: Full Code  TOTAL TIME TAKING CARE OF THIS PATIENT: 51 minutes.    Gladstone Lighter M.D on 02/09/2019 at 1:55 PM  Between 7am to 6pm - Pager - [phone]  After 6pm go to [email or web address] - password [email or web address] [facility]  Office  [phone]  CC: Primary care physician; [facility], [place]

## 2019-02-09 NOTE — Progress Notes (Signed)
Anticoagulation monitoring(Lovenox):  68 yo female ordered Lovenox 40 mg Q24h  Filed Weights   02/09/19 1027  Weight: (!) 317 lb 0.3 oz (143.8 kg)   BMI 46.8   Lab Results  Component Value Date   CREATININE 1.09 (H) 02/09/2019   CREATININE 1.08 05/10/2012   CREATININE 1.28 05/09/2012   Estimated Creatinine Clearance: 76.9 mL/min (A) (by C-G formula based on SCr of 1.09 mg/dL (H)). Hemoglobin & Hematocrit     Component Value Date/Time   HGB 13.8 02/09/2019 1035   HGB 13.1 05/11/2012 0634   HCT 43.0 02/09/2019 1035   HCT 40.0 05/11/2012 0634     Per Protocol for Patient with estCrcl > 30 ml/min and BMI > 40, will transition to Lovenox 40 mg Q12h.

## 2019-02-09 NOTE — ED Provider Notes (Signed)
[facility] Emergency Department Provider Note   ____________________________________________    I have reviewed the triage vital signs and the nursing notes.   HISTORY  Chief Complaint Altered mental status  History significantly limited  HPI Letizia Hook Sherrill is a 68 y.o. female who presents with reported altered mental status.  History is somewhat difficult to obtain as the patient is quite a poor historian.  Per EMS patient had a fall yesterday was held back into her bed but seemed altered this morning.  No further information available at this time  Past Medical History:  Diagnosis Date  . Bipolar disorder ([place])   . Depression   . Hypertension   . Obesity   . Schizophrenia ([place])   . Shortness of breath dyspnea   . Tubular adenoma of colon     Patient Active Problem List   Diagnosis Date Noted  . Back pain 02/09/2019    Past Surgical History:  Procedure Laterality Date  . COLONOSCOPY WITH PROPOFOL N/A 01/17/2016   Procedure: COLONOSCOPY WITH PROPOFOL;  Surgeon: Lollie Sails, MD;  Location: [facility] ENDOSCOPY;  Service: Endoscopy;  Laterality: N/A;  . COLONOSCOPY WITH PROPOFOL N/A 02/28/2016   Procedure: COLONOSCOPY WITH PROPOFOL;  Surgeon: Lollie Sails, MD;  Location: [facility] ENDOSCOPY;  Service: Endoscopy;  Laterality: N/A;  . 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 Authorizing Provider  amLODipine (NORVASC) 5 MG tablet Take 5 mg by mouth daily.   Yes [provider]  aspirin EC 81 MG tablet Take 81 mg by mouth daily.   Yes [provider]  docusate sodium (COLACE) 100 MG capsule Take 100 mg by mouth daily as needed for mild constipation.    Yes [provider]  isosorbide mononitrate (IMDUR) 60 MG 24 hr tablet Take 60 mg by mouth daily.   Yes [provider]  lisinopril (PRINIVIL,ZESTRIL) 20 MG tablet  Take 20 mg by mouth daily.   Yes [provider]  metoprolol tartrate (LOPRESSOR) 50 MG tablet Take 50 mg by mouth 2 (two) times daily.    Yes [provider]  paliperidone (INVEGA SUSTENNA) 234 MG/1.5ML SUSY injection Inject 234 mg into the muscle every 30 (thirty) days.   Yes [provider]  vitamin E 400 UNIT capsule Take 800 Units by mouth 2 (two) times daily.    Yes [provider]     Allergies Patient has no known allergies.  Family History  Problem Relation Age of Onset  . Diabetes Mother   . Hypertension Mother   . Diabetes Father   . Prostate cancer Father   . Breast cancer Neg Hx     Social History Social History   Tobacco Use  . Smoking status: Former Smoker    Types: Cigarettes  . Smokeless tobacco: Never Used  Substance Use Topics  . Alcohol use: No  . Drug use: No    Review of Systems limited  Constitutional: Denies chills Eyes: Denies visual changes ENT: Denies neck pain Cardiovascular: Denies chest pain. Respiratory: Denies shortness of breath. Gastrointestinal: Denies vomiting Genitourinary: Denies groin injury. Musculoskeletal: Negative for back pain. Skin: Negative for laceration Neurological: Negative for headaches denies weakness   ____________________________________________   PHYSICAL EXAM:  VITAL SIGNS: ED Triage Vitals  Enc Vitals Group     BP 02/09/19 1024 128/79     Pulse Rate 02/09/19 1024  93     Resp 02/09/19 1024 20     Temp --      Temp src --      SpO2 02/09/19 1024 97 %     Weight 02/09/19 1027 (!) 143.8 kg (317 lb 0.3 oz)     Height 02/09/19 1027 1.753 m (5\' 9" )     Head Circumference --      Peak Flow --      Pain Score --      Pain Loc --      Pain Edu? --      Excl. in [place]? --     Constitutional: Alert and oriented. No acute distress. Pleasant and interactive Eyes: Conjunctivae are normal.  Head: Atraumatic. Nose: No congestion/rhinnorhea. Mouth/Throat: Mucous membranes  are moist.   Neck:  Painless ROM Cardiovascular: Normal rate, regular rhythm. Grossly normal heart sounds.  Good peripheral circulation. Respiratory: Normal respiratory effort.  No retractions. Lungs CTAB. Gastrointestinal: Soft and nontender. No distention.  No CVA tenderness. Genitourinary: deferred Musculoskeletal: No lower extremity tenderness nor edema.  Warm and well perfused Neurologic:  Normal speech and language. No gross focal neurologic deficits are appreciated.  Skin:  Skin is warm, dry and intact. No rash noted. Psychiatric: Mood and affect are normal. Speech and behavior are normal.  ____________________________________________   LABS (all labs ordered are listed, but only abnormal results are displayed)  Labs Reviewed  COMPREHENSIVE METABOLIC PANEL - Abnormal; Notable for the following components:      Result Value   Sodium 133 (*)    Potassium 3.4 (*)    Glucose, Bld 170 (*)    Creatinine, Ser 1.09 (*)    Total Protein 6.4 (*)    AST 44 (*)    GFR calc non Af Amer 52 (*)    All other components within normal limits  URINALYSIS, COMPLETE (UACMP) WITH MICROSCOPIC - Abnormal; Notable for the following components:   Color, Urine YELLOW (*)    APPearance CLEAR (*)    Hgb urine dipstick MODERATE (*)    Ketones, ur 5 (*)    Protein, ur 30 (*)    All other components within normal limits  CBC WITH DIFFERENTIAL/PLATELET  URINE DRUG SCREEN, QUALITATIVE ([facility] ONLY)  ETHANOL  APTT  PROTIME-INR  TROPONIN I  BLOOD GAS, ARTERIAL   ____________________________________________  EKG   ____________________________________________  RADIOLOGY  Chest x-ray negative for pneumonia CT head unremarkable ____________________________________________   PROCEDURES  Procedure(s) performed: No  Procedures   Critical Care performed: No ____________________________________________   INITIAL IMPRESSION / ASSESSMENT AND PLAN / ED COURSE  Pertinent labs & imaging  results that were available during my care of the patient were reviewed by me and considered in my medical decision making (see chart for details).  Patient presents with altered mental status, reports of a fall.  She does answer questions although quite slowly.  Neuro exam is overall reassuring, no gross focal deficits.  She is afebrile here.  Differential includes metabolic encephalopathy possibly related to urinary tract infection, injury from fall or medication side effect.  Pending labs, urine, chest x-ray, CT head  Work-up is overall reassuring.  Patient sister is here now notes that the patient is still not at baseline.  I discussed with Dr. Tressia Miners of the internal medicine team for admission    ____________________________________________   FINAL CLINICAL IMPRESSION(S) / ED DIAGNOSES  Final diagnoses:  Altered mental status, unspecified altered mental status type  Note:  This document was prepared using Dragon voice recognition software and may include unintentional dictation errors.   Lavonia Drafts, MD 02/09/19 1451

## 2019-02-09 NOTE — ED Triage Notes (Signed)
Pt arrived via [facility] from home with unwitnessed fall last night. Pt is c/o of pain in the right arm, shoulder, and left knee. Pt lives with family. Family states that pt is altered but pt is able to answer questions correctly.

## 2019-02-09 NOTE — Care Management Obs Status (Signed)
[facility] NOTIFICATION   Patient Details  Name: Jill Smith MRN: [id number] Date of Birth: 08-Dec-1951   Medicare Observation Status Notification Given:  Yes    Su Hilt, RN 02/09/2019, 4:10 PM

## 2019-02-10 DIAGNOSIS — R4182 Altered mental status, unspecified: Secondary | ICD-10-CM | POA: Diagnosis not present

## 2019-02-10 LAB — BASIC METABOLIC PANEL
Anion gap: 6 (ref 5–15)
BUN: 21 mg/dL (ref 8–23)
CO2: 27 mmol/L (ref 22–32)
Calcium: 10 mg/dL (ref 8.9–10.3)
Chloride: 102 mmol/L (ref 98–111)
Creatinine, Ser: 1.12 mg/dL — ABNORMAL HIGH (ref 0.44–1.00)
GFR calc Af Amer: 59 mL/min — ABNORMAL LOW (ref >60)
GFR calc non Af Amer: 51 mL/min — ABNORMAL LOW (ref >60)
Glucose, Bld: 114 mg/dL — ABNORMAL HIGH (ref 70–99)
Potassium: 4 mmol/L (ref 3.5–5.1)
SODIUM: 135 mmol/L (ref 135–145)

## 2019-02-10 LAB — CBC
HCT: 42.5 % (ref 36.0–46.0)
Hemoglobin: 13.5 g/dL (ref 12.0–15.0)
MCH: 27.4 pg (ref 26.0–34.0)
MCHC: 31.8 g/dL (ref 30.0–36.0)
MCV: 86.4 fL (ref 80.0–100.0)
Platelets: 219 10*3/uL (ref 150–400)
RBC: 4.92 MIL/uL (ref 3.87–5.11)
RDW: 12.8 % (ref 11.5–15.5)
WBC: 6.2 10*3/uL (ref 4.0–10.5)
nRBC: 0 % (ref 0.0–0.2)

## 2019-02-10 LAB — GLUCOSE, CAPILLARY
Glucose-Capillary: 110 mg/dL — ABNORMAL HIGH (ref 70–99)
Glucose-Capillary: 112 mg/dL — ABNORMAL HIGH (ref 70–99)
Glucose-Capillary: 131 mg/dL — ABNORMAL HIGH (ref 70–99)
Glucose-Capillary: 98 mg/dL (ref 70–99)

## 2019-02-10 LAB — HIV ANTIBODY (ROUTINE TESTING W REFLEX): HIV Screen 4th Generation wRfx: NONREACTIVE

## 2019-02-10 MED ORDER — METHOCARBAMOL 500 MG PO TABS: 500.0000 mg | ORAL_TABLET | Freq: Three times a day (TID) | ORAL | 0 refills | Status: DC | PRN

## 2019-02-10 NOTE — Care Management (Signed)
Faxed Home health orders to [phone] to [facility],

## 2019-02-10 NOTE — Care Management (Signed)
Sister let me know that the patient has [place] at home thru [facility], and she would like to continue with them, phone number [phone], Patient needs a Wide RW and a WIDE 3 in 1

## 2019-02-10 NOTE — Progress Notes (Signed)
[facility] [place] NAME: Jill Smith    MR#:  [id number]  DATE OF BIRTH:  1951-08-16  SUBJECTIVE:   Patient presented to the emergency room after mechanical fall.  Reports that her back pain has improved.  Physical therapy has recommended skilled nursing facility upon discharge.  REVIEW OF SYSTEMS:    Review of Systems  Constitutional: Negative for fever, chills weight loss HENT: Negative for ear pain, nosebleeds, congestion, facial swelling, rhinorrhea, neck pain, neck stiffness and ear discharge.   Respiratory: Negative for cough, shortness of breath, wheezing  Cardiovascular: Negative for chest pain, palpitations and leg swelling.  Gastrointestinal: Negative for heartburn, abdominal pain, vomiting, diarrhea or consitpation Genitourinary: Negative for dysuria, urgency, frequency, hematuria Musculoskeletal: Negative for back pain or joint pain Neurological: Negative for dizziness, seizures, syncope, focal weakness,  numbness and headaches.  Hematological: Does not bruise/bleed easily.  Psychiatric/Behavioral: Negative for hallucinations, confusion, dysphoric mood Positive for schizophrenia   Tolerating Diet: yes      DRUG ALLERGIES:  No Known Allergies  VITALS:  Blood pressure (!) 150/68, pulse 73, temperature 97.9 F (36.6 C), temperature source Oral, resp. rate 14, height 5\' 9"  (1.753 m), weight (!) 143.8 kg, SpO2 98 %.  PHYSICAL EXAMINATION:  Constitutional: Appears obese. No distress. HENT: Normocephalic. Marland Kitchen Oropharynx is clear and moist.  Eyes: Conjunctivae and EOM are normal. PERRLA, no scleral icterus.  Neck: Normal ROM. Neck supple. No JVD. No tracheal deviation. CVS: RRR, S1/S2 +, no murmurs, no gallops, no carotid bruit.  Pulmonary: Effort and breath sounds normal, no stridor, rhonchi, wheezes, rales.  Abdominal: Soft. BS +,  no distension, tenderness, rebound or guarding.  Musculoskeletal: Normal range of motion.  No edema and no tenderness.  Neuro: Alert. CN 2-12 grossly intact. No focal deficits. Skin: Skin is warm and dry. No rash noted. Psychiatric: Normal mood and affect.      LABORATORY PANEL:   CBC Recent Labs  Lab 02/10/19 0536  WBC 6.2  HGB 13.5  HCT 42.5  PLT 219   ------------------------------------------------------------------------------------------------------------------  Chemistries  Recent Labs  Lab 02/09/19 1035 02/10/19 0536  NA 133* 135  K 3.4* 4.0  CL 101 102  CO2 25 27  GLUCOSE 170* 114*  BUN 17 21  CREATININE 1.09* 1.12*  CALCIUM 10.0 10.0  AST 44*  --   ALT 22  --   ALKPHOS 72  --   BILITOT 0.7  --    ------------------------------------------------------------------------------------------------------------------  Cardiac Enzymes Recent Labs  Lab 02/09/19 1035  TROPONINI <0.03   ------------------------------------------------------------------------------------------------------------------  RADIOLOGY:  Dg Lumbar Spine 2-3 Views  Result Date: 02/09/2019 CLINICAL DATA:  Altered mental status.  Fall yesterday. EXAM: LUMBAR SPINE - 2-3 VIEW COMPARISON:  None. FINDINGS: Vertebral body alignment and heights are normal. There is mild spondylosis throughout the lumbar spine to include moderate facet arthropathy. No evidence of compression fracture or spondylolisthesis. Minimal disc space narrowing at the L4-5 level. IMPRESSION: No acute findings. Mild to moderate spondylosis of the lumbar spine. Minimal disc disease at the L4-5 level. Electronically Signed   By: Marin Olp M.D.   On: 02/09/2019 15:07   Dg Wrist Complete Right  Result Date: 02/09/2019 CLINICAL DATA:  Fall yesterday. Altered mental status and poor historian. EXAM: RIGHT WRIST - COMPLETE 3+ VIEW COMPARISON:  None. FINDINGS: Mild degenerate change of the radiocarpal joint and carpal bones. Several small chronic fragments adjacent the carpal bones. No evidence of acute fracture or  dislocation. Mild ulnar  minus variance. IMPRESSION: No acute fracture or dislocation. Degenerative changes of the wrist. Electronically Signed   By: Marin Olp M.D.   On: 02/09/2019 15:05   Ct Head Wo Contrast  Result Date: 02/09/2019 CLINICAL DATA:  Unwitnessed fall last night. EXAM: CT HEAD WITHOUT CONTRAST TECHNIQUE: Contiguous axial images were obtained from the base of the skull through the vertex without intravenous contrast. COMPARISON:  05/09/2012 FINDINGS: Brain: No sign of acute infarction. There chronic small-vessel ischemic changes of the cerebral hemispheric white matter. No mass lesion, hemorrhage, hydrocephalus or extra-axial collection. Vascular: There is atherosclerotic calcification of the major vessels at the base of the brain. Skull: Negative Sinuses/Orbits: Clear/normal Other: Metal fragments lateral to the right mastoid region. IMPRESSION: No acute or traumatic finding. Chronic small-vessel ischemic changes of the cerebral hemispheric white matter. Electronically Signed   By: Nelson Chimes M.D.   On: 02/09/2019 11:13   Dg Chest Port 1 View  Result Date: 02/09/2019 CLINICAL DATA:  Altered mental status. EXAM: PORTABLE CHEST 1 VIEW COMPARISON:  05/09/2012 FINDINGS: Artifact overlies the chest. Heart size is normal. There is atherosclerosis of the aorta. The pulmonary vascularity is normal. The lungs are clear. No effusions. No significant bone finding. IMPRESSION: No active disease. Electronically Signed   By: Nelson Chimes M.D.   On: 02/09/2019 11:14     ASSESSMENT AND PLAN:   68 year old female with a history of bipolar/schizophrenia morbid obesity presented to the hospital after mechanical fall and back pain.  1.  Lower back pain: X-rays do not show evidence of acute fracture. Continue supportive management Patient will need skilled nursing facility upon discharge.  2.  Mild hypokalemia which has resolved  3.  Bipolar/schizophrenia: Patient will continue her outpatient  regimen.  She is at baseline  4.  Essential hypertension: Continue lisinopril, isosorbide and metoprolol      Management plans discussed with the patient and she is in agreement.  CODE STATUS: full  TOTAL TIME TAKING CARE OF THIS PATIENT: 30 minutes.     POSSIBLE D/C tomorrow, DEPENDING ON insurance approval  Jaquavian Firkus M.D on 02/10/2019 at 11:45 AM  Between 7am to 6pm - Pager - [phone] After 6pm go to [email or web address] - password [email or web address] [facility]  Office  [phone]  CC: Primary care physician; [facility] [place]  Note: This dictation was prepared with Diplomatic Services operational officer dictation along with smaller phrase technology. Any transcriptional errors that result from this process are unintentional.

## 2019-02-10 NOTE — Discharge Summary (Signed)
[facility] [place] NAME: Jill Smith    MR#:  [id number]  DATE OF BIRTH:  Oct 08, 1951  DATE OF ADMISSION:  02/09/2019 ADMITTING PHYSICIAN: Gladstone Lighter, MD  DATE OF DISCHARGE: 02/10/2019  PRIMARY CARE PHYSICIAN: [facility] [place]    ADMISSION DIAGNOSIS:  Fall [W19.XXXA] Altered mental status, unspecified altered mental status type [R41.82]  DISCHARGE DIAGNOSIS:  Active Problems:   Back pain   SECONDARY DIAGNOSIS:   Past Medical History:  Diagnosis Date  . Bipolar disorder ([place])   . Depression   . Hypertension   . Obesity   . Schizophrenia ([place])   . Shortness of breath dyspnea   . Tubular adenoma of colon     HOSPITAL COURSE:  68 year old female with a history of bipolar/schizophrenia morbid obesity presented to the hospital after mechanical fall and back pain.  1.  Lower back pain: X-rays do not show evidence of acute fracture. Continue supportive management Physical therapy has recommended skilled nursing facility upon discharge however patient refuses.  She will be discharged with home health.  2.  Mild hypokalemia which has resolved  3.  Bipolar/schizophrenia: Patient will continue her outpatient regimen.  She is at baseline  4.  Essential hypertension: Continue lisinopril, isosorbide and metoprolol    DISCHARGE CONDITIONS AND DIET:   Stable for discharge on heart healthy diet  CONSULTS OBTAINED:    DRUG ALLERGIES:  No Known Allergies  DISCHARGE MEDICATIONS:   Allergies as of 02/10/2019   No Known Allergies     Medication List    TAKE these medications   amLODipine 5 MG tablet Commonly known as:  NORVASC Take 5 mg by mouth daily.   aspirin EC 81 MG tablet Take 81 mg by mouth daily.   docusate sodium 100 MG capsule Commonly known as:  COLACE Take 100 mg by mouth daily as needed for mild constipation.   isosorbide mononitrate 60 MG 24 hr tablet Commonly known as:   IMDUR Take 60 mg by mouth daily.   lisinopril 20 MG tablet Commonly known as:  PRINIVIL,ZESTRIL Take 20 mg by mouth daily.   methocarbamol 500 MG tablet Commonly known as:  ROBAXIN Take 1 tablet (500 mg total) by mouth every 8 (eight) hours as needed for muscle spasms.   metoprolol tartrate 50 MG tablet Commonly known as:  LOPRESSOR Take 50 mg by mouth 2 (two) times daily.   paliperidone 234 MG/1.5ML Susy injection Commonly known as:  INVEGA SUSTENNA Inject 234 mg into the muscle every 30 (thirty) days.   vitamin E 400 UNIT capsule Take 800 Units by mouth 2 (two) times daily.         Today   CHIEF COMPLAINT:   No acute issues overnight. Pain with improvement   VITAL SIGNS:  Blood pressure (!) 150/68, pulse 73, temperature 97.9 F (36.6 C), temperature source Oral, resp. rate 14, height 5\' 9"  (1.753 m), weight (!) 143.8 kg, SpO2 98 %.   REVIEW OF SYSTEMS:  Review of Systems  Constitutional: Negative.  Negative for chills, fever and malaise/fatigue.  HENT: Negative.  Negative for ear discharge, ear pain, hearing loss, nosebleeds and sore throat.   Eyes: Negative.  Negative for blurred vision and pain.  Respiratory: Negative.  Negative for cough, hemoptysis, shortness of breath and wheezing.   Cardiovascular: Negative.  Negative for chest pain, palpitations and leg swelling.  Gastrointestinal: Negative.  Negative for abdominal pain, blood in stool, diarrhea, nausea and vomiting.  Genitourinary:  Negative.  Negative for dysuria.  Musculoskeletal: Positive for back pain and falls.  Skin: Negative.   Neurological: Negative for dizziness, tremors, speech change, focal weakness, seizures and headaches.  Endo/Heme/Allergies: Negative.  Does not bruise/bleed easily.  Psychiatric/Behavioral: Negative.  Negative for depression, hallucinations and suicidal ideas.       Schizoprhenia      PHYSICAL EXAMINATION:  GENERAL:  68 y.o.-year-old patient lying in the bed with no  acute distress.  Obese NECK:  Supple, no jugular venous distention. No thyroid enlargement, no tenderness.  LUNGS: Normal breath sounds bilaterally, no wheezing, rales,rhonchi  No use of accessory muscles of respiration.  CARDIOVASCULAR: S1, S2 normal. No murmurs, rubs, or gallops.  ABDOMEN: Soft, non-tender, non-distended. Bowel sounds present. No organomegaly or mass.  EXTREMITIES: No pedal edema, cyanosis, or clubbing.  PSYCHIATRIC: The patient is alert and oriented x 3.  SKIN: No obvious rash, lesion, or ulcer.   DATA REVIEW:   CBC Recent Labs  Lab 02/10/19 0536  WBC 6.2  HGB 13.5  HCT 42.5  PLT 219    Chemistries  Recent Labs  Lab 02/09/19 1035 02/10/19 0536  NA 133* 135  K 3.4* 4.0  CL 101 102  CO2 25 27  GLUCOSE 170* 114*  BUN 17 21  CREATININE 1.09* 1.12*  CALCIUM 10.0 10.0  AST 44*  --   ALT 22  --   ALKPHOS 72  --   BILITOT 0.7  --     Cardiac Enzymes Recent Labs  Lab 02/09/19 1035  TROPONINI <0.03    Microbiology Results  @MICRORSLT48 @  RADIOLOGY:  Dg Lumbar Spine 2-3 Views  Result Date: 02/09/2019 CLINICAL DATA:  Altered mental status.  Fall yesterday. EXAM: LUMBAR SPINE - 2-3 VIEW COMPARISON:  None. FINDINGS: Vertebral body alignment and heights are normal. There is mild spondylosis throughout the lumbar spine to include moderate facet arthropathy. No evidence of compression fracture or spondylolisthesis. Minimal disc space narrowing at the L4-5 level. IMPRESSION: No acute findings. Mild to moderate spondylosis of the lumbar spine. Minimal disc disease at the L4-5 level. Electronically Signed   By: Marin Olp M.D.   On: 02/09/2019 15:07   Dg Wrist Complete Right  Result Date: 02/09/2019 CLINICAL DATA:  Fall yesterday. Altered mental status and poor historian. EXAM: RIGHT WRIST - COMPLETE 3+ VIEW COMPARISON:  None. FINDINGS: Mild degenerate change of the radiocarpal joint and carpal bones. Several small chronic fragments adjacent the carpal  bones. No evidence of acute fracture or dislocation. Mild ulnar minus variance. IMPRESSION: No acute fracture or dislocation. Degenerative changes of the wrist. Electronically Signed   By: Marin Olp M.D.   On: 02/09/2019 15:05   Ct Head Wo Contrast  Result Date: 02/09/2019 CLINICAL DATA:  Unwitnessed fall last night. EXAM: CT HEAD WITHOUT CONTRAST TECHNIQUE: Contiguous axial images were obtained from the base of the skull through the vertex without intravenous contrast. COMPARISON:  05/09/2012 FINDINGS: Brain: No sign of acute infarction. There chronic small-vessel ischemic changes of the cerebral hemispheric white matter. No mass lesion, hemorrhage, hydrocephalus or extra-axial collection. Vascular: There is atherosclerotic calcification of the major vessels at the base of the brain. Skull: Negative Sinuses/Orbits: Clear/normal Other: Metal fragments lateral to the right mastoid region. IMPRESSION: No acute or traumatic finding. Chronic small-vessel ischemic changes of the cerebral hemispheric white matter. Electronically Signed   By: Nelson Chimes M.D.   On: 02/09/2019 11:13   Dg Chest Port 1 View  Result Date: 02/09/2019 CLINICAL DATA:  Altered  mental status. EXAM: PORTABLE CHEST 1 VIEW COMPARISON:  05/09/2012 FINDINGS: Artifact overlies the chest. Heart size is normal. There is atherosclerosis of the aorta. The pulmonary vascularity is normal. The lungs are clear. No effusions. No significant bone finding. IMPRESSION: No active disease. Electronically Signed   By: Nelson Chimes M.D.   On: 02/09/2019 11:14      Allergies as of 02/10/2019   No Known Allergies     Medication List    TAKE these medications   amLODipine 5 MG tablet Commonly known as:  NORVASC Take 5 mg by mouth daily.   aspirin EC 81 MG tablet Take 81 mg by mouth daily.   docusate sodium 100 MG capsule Commonly known as:  COLACE Take 100 mg by mouth daily as needed for mild constipation.   isosorbide mononitrate 60 MG  24 hr tablet Commonly known as:  IMDUR Take 60 mg by mouth daily.   lisinopril 20 MG tablet Commonly known as:  PRINIVIL,ZESTRIL Take 20 mg by mouth daily.   methocarbamol 500 MG tablet Commonly known as:  ROBAXIN Take 1 tablet (500 mg total) by mouth every 8 (eight) hours as needed for muscle spasms.   metoprolol tartrate 50 MG tablet Commonly known as:  LOPRESSOR Take 50 mg by mouth 2 (two) times daily.   paliperidone 234 MG/1.5ML Susy injection Commonly known as:  INVEGA SUSTENNA Inject 234 mg into the muscle every 30 (thirty) days.   vitamin E 400 UNIT capsule Take 800 Units by mouth 2 (two) times daily.          Management plans discussed with the patient and she is in agreement. Stable for discharge home  Patient should follow up with pcp  CODE STATUS:     Code Status Orders  (From admission, onward)         Start     Ordered   02/09/19 1539  Full code  Continuous     02/09/19 1539        Code Status History    This patient has a current code status but no historical code status.      TOTAL TIME TAKING CARE OF THIS PATIENT: 38 minutes.    Note: This dictation was prepared with Dragon dictation along with smaller phrase technology. Any transcriptional errors that result from this process are unintentional.  Romney Compean M.D on 02/10/2019 at 1:31 PM  Between 7am to 6pm - Pager - [phone] After 6pm go to [email or web address] - password [email or web address] [facility]  Office  [phone]  CC: Primary care physician; [facility] [place]

## 2019-02-10 NOTE — NC FL2 (Signed)
[place] LEVEL OF CARE SCREENING TOOL     IDENTIFICATION  Patient Name: Jill Smith Birthdate: 1951/01/28 Sex: female Admission Date (Current Location): 02/09/2019  [place] and [place] Number:  Selena Lesser ([phone]) Facility and Address:  [facility], [address] [place], [place] [address]      Provider Number: [id number]  Attending Physician Name and Address:  Bettey Costa, MD  Relative Name and Phone Number:       Current Level of Care: Hospital Recommended Level of Care: [place] Prior Approval Number:    Date Approved/Denied:   PASRR Number: ([id number])  Discharge Plan: SNF    Current Diagnoses: Patient Active Problem List   Diagnosis Date Noted  . Back pain 02/09/2019    Orientation RESPIRATION BLADDER Height & Weight     Self, Time, Situation, Place  Normal Continent Weight: (!) 317 lb 0.3 oz (143.8 kg) Height:  5\' 9"  (175.3 cm)  BEHAVIORAL SYMPTOMS/MOOD NEUROLOGICAL BOWEL NUTRITION STATUS      Continent Diet(Diet: Heart Healthy )  AMBULATORY STATUS COMMUNICATION OF NEEDS Skin   Extensive Assist Verbally Normal                       Personal Care Assistance Level of Assistance  Bathing, Feeding, Dressing Bathing Assistance: Limited assistance Feeding assistance: Independent Dressing Assistance: Limited assistance     Functional Limitations Info  Sight, Hearing, Speech Sight Info: Adequate Hearing Info: Adequate Speech Info: Adequate    SPECIAL CARE FACTORS FREQUENCY  PT (By licensed PT), OT (By licensed OT)     PT Frequency: (5) OT Frequency: (5)            Contractures      Additional Factors Info  Code Status, Allergies Code Status Info: (Full Code. ) Allergies Info: (No Known Allergies. )           Current Medications (02/10/2019):  This is the current hospital active medication list Current Facility-Administered Medications  Medication Dose Route Frequency  Provider Last Rate Last Dose  . 0.9 %  sodium chloride infusion   Intravenous Continuous Gladstone Lighter, MD 60 mL/hr at 02/10/19 0300    . acetaminophen (TYLENOL) tablet 650 mg  650 mg Oral Q6H PRN Gladstone Lighter, MD       Or  . acetaminophen (TYLENOL) suppository 650 mg  650 mg Rectal Q6H PRN Gladstone Lighter, MD      . amLODipine (NORVASC) tablet 5 mg  5 mg Oral Daily Gladstone Lighter, MD   5 mg at 02/10/19 0937  . aspirin EC tablet 81 mg  81 mg Oral Daily Gladstone Lighter, MD   81 mg at 02/10/19 0936  . docusate sodium (COLACE) capsule 100 mg  100 mg Oral Daily PRN Gladstone Lighter, MD      . enoxaparin (LOVENOX) injection 40 mg  40 mg Subcutaneous Q12H Gladstone Lighter, MD   40 mg at 02/10/19 0936  . HYDROcodone-acetaminophen (NORCO/VICODIN) 5-325 MG per tablet 1 tablet  1 tablet Oral Q4H PRN Gladstone Lighter, MD      . insulin aspart (novoLOG) injection 0-5 Units  0-5 Units Subcutaneous QHS Gladstone Lighter, MD      . insulin aspart (novoLOG) injection 0-9 Units  0-9 Units Subcutaneous TID WC Gladstone Lighter, MD   1 Units at 02/09/19 1743  . isosorbide mononitrate (IMDUR) 24 hr tablet 60 mg  60 mg Oral Daily Gladstone Lighter, MD   60 mg at 02/10/19 0936  .  lisinopril (PRINIVIL,ZESTRIL) tablet 20 mg  20 mg Oral Daily Gladstone Lighter, MD   20 mg at 02/10/19 0937  . methocarbamol (ROBAXIN) tablet 500 mg  500 mg Oral TID Gladstone Lighter, MD   500 mg at 02/10/19 0936  . metoprolol tartrate (LOPRESSOR) tablet 50 mg  50 mg Oral BID Gladstone Lighter, MD   50 mg at 02/10/19 0937  . ondansetron (ZOFRAN) tablet 4 mg  4 mg Oral Q6H PRN Gladstone Lighter, MD       Or  . ondansetron (ZOFRAN) injection 4 mg  4 mg Intravenous Q6H PRN Gladstone Lighter, MD      . potassium chloride SA (K-DUR,KLOR-CON) CR tablet 40 mEq  40 mEq Oral Once Gladstone Lighter, MD      . traMADol Veatrice Bourbon) tablet 50 mg  50 mg Oral Q6H PRN Gladstone Lighter, MD      . vitamin E capsule 800  Units  800 Units Oral BID Gladstone Lighter, MD   800 Units at 02/09/19 2109     Discharge Medications: Please see discharge summary for a list of discharge medications.  Relevant Imaging Results:  Relevant Lab Results:   Additional Information (SSN: [id number])  Tylik Treese, Veronia Beets, LCSW

## 2019-02-10 NOTE — Care Management Note (Signed)
Case Management Note  Patient Details  Name: Jill Smith MRN: [id number] Date of Birth: [birth year]  Subjective/Objective:                  Talked with sister about needs and plan Sister stated that the patient will need a wide RW and a Wide 3 in 1 Notified Brad with [facility] Patient uses [facility] for [facility] pt, Phone number is [phone] Called [facility] And they verified the patient and the fax number to fax orders [phone] Will fax [facility] orders upon DC   Action/Plan: Provided the patient with choice of [place] per CMS.gov Patient already set up with [facility] Called [facility] for [facility] and obtained fax number   Expected Discharge Date:  02/10/19               Expected Discharge Plan:     In-House Referral:     Discharge planning Services  CM Consult  Post Acute Care Choice:  Durable Medical Equipment Choice offered to:     DME Arranged:  3-N-1, Walker rolling DME Agency:  [place]:  PT [facility] Agency:     Status of Service:     If discussed at [place] of Stay Meetings, dates discussed:    Additional Comments:  Su Hilt, RN 02/10/2019, 1:27 PM

## 2019-02-10 NOTE — Progress Notes (Signed)
Discharge summary reviewed with verbal understanding. 

## 2019-02-10 NOTE — Evaluation (Signed)
Physical Therapy Evaluation Patient Details Name: Jill Smith MRN: [id number] DOB: 1951-06-06 Today's Date: 02/10/2019   History of Present Illness  68 y.o. female with a known history of bipolar disorder, schizophrenia, morbid obesity, hypertension presents to hospital after a fall and low back pain.    Clinical Impression  Pt struggled with all aspects of bed mobility and needed heavy assist to get toward EOB, up to sitting and to standing (with walker).  Once up she did relatively well taking a few steps (heavy reliance on walker) to go bed to recliner.  She needed a lot of cuing and assist with all mobility and struggled to even maintain sitting balance initially until assisted considerably at EOB.  Pt would not be able to go home and manage/be safe and PT is recommending STR at this time.      Follow Up Recommendations SNF    Equipment Recommendations  None recommended by PT    Recommendations for Other Services       Precautions / Restrictions Precautions Precautions: Fall Restrictions Weight Bearing Restrictions: No      Mobility  Bed Mobility Overal bed mobility: Needs Assistance Bed Mobility: Supine to Sit     Supine to sit: Mod assist;Max assist     General bed mobility comments: Pt was able to get LEs moving toward EOB, but ultimately needed considerable assist to get toward EOB and then to transition to sitting  Transfers Overall transfer level: Needs assistance Equipment used: Rolling walker (2 wheeled) Transfers: Sit to/from Stand Sit to Stand: Max assist         General transfer comment: Pt struggled to get herself into position to be able to rise, but once assisted showed good effort.  She ultimately needed bed raised and heavy assist but was able to maintain standing balance well once upright  Ambulation/Gait Ambulation/Gait assistance: Min guard Gait Distance (Feet): 5 Feet Assistive device: Rolling walker (2 wheeled)       General Gait  Details: Pt was able to ambulate a few steps to recliner with good relative confidence, she showed heavy reliance on the walker but did not need a lot of direct phyiscal assist  Stairs            Wheelchair Mobility    Modified Rankin (Stroke Patients Only)       Balance Overall balance assessment: Needs assistance Sitting-balance support: Single extremity supported;Feet supported Sitting balance-Leahy Scale: Fair Sitting balance - Comments: Pt unable to scoot independently to get toward EOB and until assisted was falling back and to the L   Standing balance support: Bilateral upper extremity supported Standing balance-Leahy Scale: Fair Standing balance comment: Pt unable to assume standing position w/o heavy assist, but once up showed good relative confidence and safety  (heavy use of walker)                             Pertinent Vitals/Pain Pain Assessment: 0-10 Pain Score: 5 (reports increase pain to 8/10 with movement) Pain Location: low back     Home Living Family/patient expects to be discharged to:: Skilled nursing facility Living Arrangements: (sister)                    Prior Function Level of Independence: Needs assistance   Gait / Transfers Assistance Needed: Pt reports that she is normally able to get around in the home w/o AD, w/o assist, unsure how true this  is in reality           Hand Dominance        Extremity/Trunk Assessment   Upper Extremity Assessment Upper Extremity Assessment: Generalized weakness    Lower Extremity Assessment Lower Extremity Assessment: Generalized weakness(Pt showed some effort with LE testing but was pain limited)       Communication   Communication: No difficulties  Cognition Arousal/Alertness: Lethargic Behavior During Therapy: WFL for tasks assessed/performed Overall Cognitive Status: Difficult to assess                                 General Comments: Pt situationally  awake, knows month/year, but inconsistent, delayed or otherwise unable to answer some basic questions too      General Comments      Exercises     Assessment/Plan    PT Assessment Patient needs continued PT services  PT Problem List Decreased strength;Decreased range of motion;Decreased activity tolerance;Decreased balance;Decreased mobility;Decreased knowledge of use of DME;Decreased cognition;Decreased safety awareness;Pain       PT Treatment Interventions DME instruction;Gait training;Functional mobility training;Therapeutic activities;Therapeutic exercise;Balance training;Neuromuscular re-education;Patient/family education;Cognitive remediation    PT Goals (Current goals can be found in the Care Plan section)  Acute Rehab PT Goals Patient Stated Goal: get strong enough to go home PT Goal Formulation: With patient Time For Goal Achievement: 02/24/19 Potential to Achieve Goals: Fair    Frequency Min 2X/week   Barriers to discharge        Co-evaluation               AM-PAC PT "6 Clicks" Mobility  Outcome Measure Help needed turning from your back to your side while in a flat bed without using bedrails?: A Lot Help needed moving from lying on your back to sitting on the side of a flat bed without using bedrails?: Total Help needed moving to and from a bed to a chair (including a wheelchair)?: A Little Help needed standing up from a chair using your arms (e.g., wheelchair or bedside chair)?: A Lot Help needed to walk in hospital room?: A Little Help needed climbing 3-5 steps with a railing? : A Lot 6 Click Score: 13    End of Session Equipment Utilized During Treatment: Gait belt Activity Tolerance: Patient limited by fatigue;Patient limited by pain Patient left: with chair alarm set;with call bell/phone within reach Nurse Communication: Mobility status PT Visit Diagnosis: Muscle weakness (generalized) (M62.81);Difficulty in walking, not elsewhere classified  (R26.2);Pain Pain - part of body: (lumbago)    Time: 3419-3790 PT Time Calculation (min) (ACUTE ONLY): 27 min   Charges:   PT Evaluation $PT Eval Low Complexity: 1 Low PT Treatments $Therapeutic Activity: 8-22 mins       Kreg Shropshire, DPT 02/10/2019, 11:29 AM

## 2019-02-10 NOTE — Clinical Social Work Note (Signed)
Clinical Social Work Assessment  Patient Details  Name: Jill Smith MRN: [id number] Date of Birth: 1951-04-11  Date of referral:  02/10/19               Reason for consult:  Facility Placement                Permission sought to share information with:    Permission granted to share information::     Name::        Agency::     Relationship::     Contact Information:     Housing/Transportation Living arrangements for the past 2 months:  Single Family Home Source of Information:  Patient, Siblings Patient Interpreter Needed:  None Criminal Activity/Legal Involvement Pertinent to Current Situation/Hospitalization:  No - Comment as needed Significant Relationships:  Siblings Lives with:  Siblings Do you feel safe going back to the place where you live?  Yes Need for family participation in patient care:  Yes (Comment)  Care giving concerns:  Patient lives in [place] with her sister Jill Smith.    Social Worker assessment / plan:  Holiday representative (CSW) received verbal SNF consult from PT. CSW met with patient alone at bedside to discuss D/C plan. Patient was alert and oriented X3 and was sitting up in the chair at bedside. CSW introduced self and explained role of CSW department. Per patient she lives in [place] with her sister Jill Smith. CSW explained that PT is recommending SNF and that [facility] will have to approve SNF. Patient reported that she did not want to go to SNF and wants to go home. Patient gave CSW permission to call her sister Jill Smith. CSW contacted patient's sister Jill Smith while in patient's room. Per sister patient has been living with her for 34 years and she is patient's HPOA. Per sister she does not have guardianship over patient through the court she just has HPOA. CSW made sister aware that PT is recommending SNF for patient however patient prefers to come home. Per sister she will not force patient to go to SNF and patient can D/C home. Per sister patient has an aide  that comes in weekly to help give patient a bath. Sister is agreeable to home health PT. RN case manager, RN and MD aware of above. CSW will continue to follow and assist as needed.   Employment status:  Disabled (Comment on whether or not currently receiving Disability), Retired Nurse, adult PT Recommendations:  [place] / Referral to community resources:  Other (Comment Required)(Patient refused SNF and wants to D/C home with home health. )  Patient/Family's Response to care:  Patient refused SNF.   Patient/Family's Understanding of and Emotional Response to Diagnosis, Current Treatment, and Prognosis:  Patient and her were very pleasant and thanked CSW for assistance.   Emotional Assessment Appearance:  Appears stated age Attitude/Demeanor/Rapport:    Affect (typically observed):  Accepting, Pleasant Orientation:  Oriented to Self, Oriented to Place, Oriented to  Time, Fluctuating Orientation (Suspected and/or reported Sundowners) Alcohol / Substance use:  Not Applicable Psych involvement (Current and /or in the community):  No (Comment)  Discharge Needs  Concerns to be addressed:  Discharge Planning Concerns Readmission within the last 30 days:  No Current discharge risk:  Dependent with Mobility, Chronically ill Barriers to Discharge:  Continued Medical Work up   [employer], Veronia Beets, LCSW 02/10/2019, 2:16 PM

## 2019-02-18 ENCOUNTER — Ambulatory Visit
Admission: RE | Admit: 2019-02-18 | Discharge: 2019-02-18 | Disposition: A | Discharge status: Home / Self Care | Payer: Medicare HMO | Source: Ambulatory Visit | Attending: Family Medicine | Admitting: Family Medicine

## 2019-02-18 DIAGNOSIS — Z780 Asymptomatic menopausal state: Secondary | ICD-10-CM | POA: Diagnosis not present

## 2019-02-18 DIAGNOSIS — Z1231 Encounter for screening mammogram for malignant neoplasm of breast: Secondary | ICD-10-CM

## 2020-03-02 ENCOUNTER — Ambulatory Visit: Payer: Medicare HMO | Attending: Internal Medicine

## 2020-08-18 ENCOUNTER — Other Ambulatory Visit: Payer: Self-pay | Admitting: Family Medicine

## 2020-08-18 DIAGNOSIS — Z1231 Encounter for screening mammogram for malignant neoplasm of breast: Secondary | ICD-10-CM

## 2021-07-08 ENCOUNTER — Emergency Department: Payer: Medicare HMO

## 2021-07-08 ENCOUNTER — Other Ambulatory Visit: Payer: Self-pay

## 2021-07-08 ENCOUNTER — Encounter: Payer: Self-pay | Admitting: Intensive Care

## 2021-07-08 ENCOUNTER — Emergency Department
Admission: EM | Admit: 2021-07-08 | Discharge: 2021-07-12 | Disposition: A | Discharge status: Home / Self Care | Payer: Medicare HMO | Attending: Emergency Medicine | Admitting: Emergency Medicine

## 2021-07-08 DIAGNOSIS — R269 Unspecified abnormalities of gait and mobility: Secondary | ICD-10-CM | POA: Insufficient documentation

## 2021-07-08 DIAGNOSIS — Z20822 Contact with and (suspected) exposure to covid-19: Secondary | ICD-10-CM | POA: Diagnosis not present

## 2021-07-08 DIAGNOSIS — R262 Difficulty in walking, not elsewhere classified: Secondary | ICD-10-CM

## 2021-07-08 DIAGNOSIS — Z7982 Long term (current) use of aspirin: Secondary | ICD-10-CM | POA: Diagnosis not present

## 2021-07-08 DIAGNOSIS — Z87891 Personal history of nicotine dependence: Secondary | ICD-10-CM | POA: Insufficient documentation

## 2021-07-08 DIAGNOSIS — I10 Essential (primary) hypertension: Secondary | ICD-10-CM | POA: Insufficient documentation

## 2021-07-08 DIAGNOSIS — R531 Weakness: Secondary | ICD-10-CM | POA: Diagnosis not present

## 2021-07-08 DIAGNOSIS — Z79899 Other long term (current) drug therapy: Secondary | ICD-10-CM | POA: Diagnosis not present

## 2021-07-08 DIAGNOSIS — R296 Repeated falls: Secondary | ICD-10-CM | POA: Diagnosis not present

## 2021-07-08 DIAGNOSIS — R32 Unspecified urinary incontinence: Secondary | ICD-10-CM | POA: Diagnosis not present

## 2021-07-08 LAB — URINALYSIS, COMPLETE (UACMP) WITH MICROSCOPIC
Bacteria, UA: NONE SEEN
Bilirubin Urine: NEGATIVE
Glucose, UA: NEGATIVE mg/dL
Ketones, ur: 5 mg/dL — AB
Leukocytes,Ua: NEGATIVE
Nitrite: NEGATIVE
Protein, ur: NEGATIVE mg/dL
Specific Gravity, Urine: 1.021 (ref 1.005–1.030)
pH: 5 (ref 5.0–8.0)

## 2021-07-08 LAB — CBC WITH DIFFERENTIAL/PLATELET
Abs Immature Granulocytes: 0.02 10*3/uL (ref 0.00–0.07)
Basophils Absolute: 0 10*3/uL (ref 0.0–0.1)
Basophils Relative: 0 %
Eosinophils Absolute: 0 10*3/uL (ref 0.0–0.5)
Eosinophils Relative: 0 %
HCT: 44.2 % (ref 36.0–46.0)
Hemoglobin: 14.3 g/dL (ref 12.0–15.0)
Immature Granulocytes: 0 %
Lymphocytes Relative: 26 %
Lymphs Abs: 1.4 10*3/uL (ref 0.7–4.0)
MCH: 28.3 pg (ref 26.0–34.0)
MCHC: 32.4 g/dL (ref 30.0–36.0)
MCV: 87.5 fL (ref 80.0–100.0)
Monocytes Absolute: 0.6 10*3/uL (ref 0.1–1.0)
Monocytes Relative: 12 %
Neutro Abs: 3.3 10*3/uL (ref 1.7–7.7)
Neutrophils Relative %: 62 %
Platelets: 192 10*3/uL (ref 150–400)
RBC: 5.05 MIL/uL (ref 3.87–5.11)
RDW: 12.3 % (ref 11.5–15.5)
WBC: 5.4 10*3/uL (ref 4.0–10.5)
nRBC: 0 % (ref 0.0–0.2)

## 2021-07-08 LAB — COMPREHENSIVE METABOLIC PANEL
ALT: 15 U/L (ref 0–44)
AST: 20 U/L (ref 15–41)
Albumin: 3.8 g/dL (ref 3.5–5.0)
Alkaline Phosphatase: 55 U/L (ref 38–126)
Anion gap: 8 (ref 5–15)
BUN: 14 mg/dL (ref 8–23)
CO2: 25 mmol/L (ref 22–32)
Calcium: 10.2 mg/dL (ref 8.9–10.3)
Chloride: 105 mmol/L (ref 98–111)
Creatinine, Ser: 1.03 mg/dL — ABNORMAL HIGH (ref 0.44–1.00)
GFR, Estimated: 59 mL/min — ABNORMAL LOW (ref >60)
Glucose, Bld: 107 mg/dL — ABNORMAL HIGH (ref 70–99)
Potassium: 4.4 mmol/L (ref 3.5–5.1)
Sodium: 138 mmol/L (ref 135–145)
Total Bilirubin: 1.1 mg/dL (ref 0.3–1.2)
Total Protein: 6.8 g/dL (ref 6.5–8.1)

## 2021-07-08 LAB — URINE DRUG SCREEN, QUALITATIVE (ARMC ONLY)
Amphetamines, Ur Screen: NOT DETECTED
Barbiturates, Ur Screen: NOT DETECTED
Benzodiazepine, Ur Scrn: NOT DETECTED
Cannabinoid 50 Ng, Ur ~~LOC~~: NOT DETECTED
Cocaine Metabolite,Ur ~~LOC~~: NOT DETECTED
MDMA (Ecstasy)Ur Screen: NOT DETECTED
Methadone Scn, Ur: NOT DETECTED
Opiate, Ur Screen: NOT DETECTED
Phencyclidine (PCP) Ur S: NOT DETECTED
Tricyclic, Ur Screen: NOT DETECTED

## 2021-07-08 LAB — TROPONIN I (HIGH SENSITIVITY)
Troponin I (High Sensitivity): 9 ng/L (ref <18)
Troponin I (High Sensitivity): 8 ng/L (ref <18)

## 2021-07-08 MED ORDER — GADOBUTROL 1 MMOL/ML IV SOLN
10.0000 mL | Freq: Once | INTRAVENOUS | Status: AC | PRN
  Administered 2021-07-08: 10 mL via INTRAVENOUS

## 2021-07-08 MED ORDER — LISINOPRIL 10 MG PO TABS
20.0000 mg | ORAL_TABLET | Freq: Every day | ORAL | Status: DC
  Administered 2021-07-09 – 2021-07-12 (×4): 20 mg via ORAL
  Filled 2021-07-08 (×5): qty 2

## 2021-07-08 MED ORDER — VITAMIN E 45 MG (100 UNIT) PO CAPS
800.0000 [IU] | ORAL_CAPSULE | Freq: Two times a day (BID) | ORAL | Status: DC
  Administered 2021-07-10 – 2021-07-12 (×4): 800 [IU] via ORAL
  Filled 2021-07-08 (×8): qty 8

## 2021-07-08 MED ORDER — BRIMONIDINE TARTRATE 0.2 % OP SOLN
1.0000 [drp] | Freq: Three times a day (TID) | OPHTHALMIC | Status: DC
  Administered 2021-07-09 – 2021-07-12 (×8): 1 [drp] via OPHTHALMIC
  Filled 2021-07-08: qty 5

## 2021-07-08 MED ORDER — BRINZOLAMIDE 1 % OP SUSP
1.0000 [drp] | Freq: Three times a day (TID) | OPHTHALMIC | Status: DC
  Administered 2021-07-09 – 2021-07-12 (×8): 1 [drp] via OPHTHALMIC
  Filled 2021-07-08: qty 10

## 2021-07-08 MED ORDER — QUETIAPINE FUMARATE 25 MG PO TABS
100.0000 mg | ORAL_TABLET | Freq: Every day | ORAL | Status: DC
  Administered 2021-07-09 – 2021-07-11 (×3): 100 mg via ORAL
  Filled 2021-07-08 (×4): qty 4

## 2021-07-08 MED ORDER — METOPROLOL TARTRATE 50 MG PO TABS
50.0000 mg | ORAL_TABLET | Freq: Two times a day (BID) | ORAL | Status: DC
  Administered 2021-07-09 – 2021-07-11 (×7): 50 mg via ORAL
  Filled 2021-07-08 (×8): qty 1

## 2021-07-08 MED ORDER — DOCUSATE SODIUM 100 MG PO CAPS: 100.0000 mg | ORAL_CAPSULE | Freq: Every day | ORAL | Status: DC | PRN

## 2021-07-08 MED ORDER — ASPIRIN EC 81 MG PO TBEC
81.0000 mg | DELAYED_RELEASE_TABLET | Freq: Every day | ORAL | Status: DC
  Administered 2021-07-09 – 2021-07-12 (×4): 81 mg via ORAL
  Filled 2021-07-08 (×5): qty 1

## 2021-07-08 MED ORDER — AMLODIPINE BESYLATE 5 MG PO TABS
5.0000 mg | ORAL_TABLET | Freq: Every day | ORAL | Status: DC
  Administered 2021-07-09 – 2021-07-11 (×3): 5 mg via ORAL
  Filled 2021-07-08 (×6): qty 1

## 2021-07-08 MED ORDER — SODIUM CHLORIDE 0.9 % IV BOLUS
1000.0000 mL | Freq: Once | INTRAVENOUS | Status: AC
  Administered 2021-07-08: 1000 mL via INTRAVENOUS

## 2021-07-08 MED ORDER — ISOSORBIDE MONONITRATE ER 60 MG PO TB24
60.0000 mg | ORAL_TABLET | Freq: Every day | ORAL | Status: DC
  Administered 2021-07-09 – 2021-07-12 (×4): 60 mg via ORAL
  Filled 2021-07-08 (×5): qty 1

## 2021-07-08 NOTE — ED Notes (Signed)
Patient is resting comfortably. 

## 2021-07-08 NOTE — ED Notes (Signed)
Patient transported to MRI 

## 2021-07-08 NOTE — ED Provider Notes (Signed)
[facility] Emergency Department Provider Note   ____________________________________________   Event Date/Time   First MD Initiated Contact with Patient 07/08/21 1047     (approximate)  I have reviewed the triage vital signs and the nursing notes.   HISTORY  Chief Complaint Weakness    HPI Jill Smith is a 70 y.o. female who presents via EMS from home with family complaining patient having generalized weakness and urinary incontinence  LOCATION: Generalized DURATION: 2 days TIMING: Worsening since onset SEVERITY: Moderate QUALITY: Generalized weakness CONTEXT: Patient states she has had similar symptoms before when she has had a urinary tract infection MODIFYING FACTORS: Denies any exacerbating or relieving factors ASSOCIATED SYMPTOMS: Associated urinary incontinence   Per medical record review patient has a history of bipolar, depression, hypertension, morbid obesity, and schizophrenia          Past Medical History:  Diagnosis Date   Bipolar disorder ([place])    Depression    Hypertension    Obesity    Schizophrenia ([place])    Shortness of breath dyspnea    Tubular adenoma of colon     Patient Active Problem List   Diagnosis Date Noted   Back pain 02/09/2019    Past Surgical History:  Procedure Laterality Date   COLONOSCOPY WITH PROPOFOL N/A 01/17/2016   Procedure: COLONOSCOPY WITH PROPOFOL;  Surgeon: Lollie Sails, MD;  Location: [facility] ENDOSCOPY;  Service: Endoscopy;  Laterality: N/A;   COLONOSCOPY WITH PROPOFOL N/A 02/28/2016   Procedure: COLONOSCOPY WITH PROPOFOL;  Surgeon: Lollie Sails, MD;  Location: [facility] ENDOSCOPY;  Service: Endoscopy;  Laterality: N/A;   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 Authorizing Provider  amLODipine (NORVASC) 5 MG tablet Take 5 mg by mouth daily.   Yes [provider]  aspirin EC 81  MG tablet Take 81 mg by mouth daily.   Yes [provider]  docusate sodium (COLACE) 100 MG capsule Take 100 mg by mouth daily as needed for mild constipation.    Yes [provider]  isosorbide mononitrate (IMDUR) 60 MG 24 hr tablet Take 60 mg by mouth daily.   Yes [provider]  lisinopril (PRINIVIL,ZESTRIL) 20 MG tablet Take 20 mg by mouth daily.   Yes [provider]  metoprolol tartrate (LOPRESSOR) 50 MG tablet Take 50 mg by mouth 2 (two) times daily.    Yes [provider]  paliperidone (INVEGA SUSTENNA) 234 MG/1.5ML SUSY injection Inject 234 mg into the muscle every 30 (thirty) days.   Yes [provider]  QUEtiapine (SEROQUEL) 100 MG tablet Take 100 mg by mouth at bedtime. 06/15/21  Yes [provider]  SIMBRINZA 1-0.2 % SUSP Place 1 drop into the left eye 3 (three) times daily. 02/20/21  Yes [provider]  vitamin E 400 UNIT capsule Take 800 Units by mouth 2 (two) times daily.    Yes [provider]  methocarbamol (ROBAXIN) 500 MG tablet Take 1 tablet (500 mg total) by mouth every 8 (eight) hours as needed for muscle spasms. Patient not taking: Reported on 07/08/2021 02/10/19   Bettey Costa, MD    Allergies Patient has no known allergies.  Family History  Problem Relation Age of Onset   Diabetes Mother    Hypertension Mother    Diabetes Father    Prostate cancer Father    Breast cancer Neg Hx  Social History Social History   Tobacco Use   Smoking status: Former    Pack years: 0.00    Types: Cigarettes   Smokeless tobacco: Never  Substance Use Topics   Alcohol use: No   Drug use: No    Review of Systems Constitutional: No fever/chills Eyes: No visual changes. ENT: No sore throat. Cardiovascular: Denies chest pain. Respiratory: Denies shortness of breath. Gastrointestinal: No abdominal pain.  No nausea, no vomiting.  No diarrhea. Genitourinary: Negative for dysuria. Musculoskeletal:  Negative for acute arthralgias Skin: Negative for rash. Neurological: Negative for headaches, numbness/paresthesias in any extremity Psychiatric: Negative for suicidal ideation/homicidal ideation   ____________________________________________   PHYSICAL EXAM:  VITAL SIGNS: ED Triage Vitals  Enc Vitals Group     BP 07/08/21 1057 (!) 154/85     Pulse Rate 07/08/21 1057 (!) 57     Resp 07/08/21 1057 13     Temp 07/08/21 1057 98.1 F (36.7 C)     Temp Source 07/08/21 1057 Oral     SpO2 07/08/21 1057 100 %     Weight 07/08/21 1056 300 lb (136.1 kg)     Height 07/08/21 1056 5\' 9"  (1.753 m)     Head Circumference --      Peak Flow --      Pain Score 07/08/21 1054 5     Pain Loc --      Pain Edu? --      Excl. in [place]? --    Constitutional: Alert and oriented. Well appearing and in no acute distress. Eyes: Conjunctivae are normal. PERRL. Head: Atraumatic. Nose: No congestion/rhinnorhea. Mouth/Throat: Mucous membranes are moist. Neck: No stridor Cardiovascular: Grossly normal heart sounds.  Good peripheral circulation. Respiratory: Normal respiratory effort.  No retractions. Gastrointestinal: Soft and nontender. No distention. Musculoskeletal: No obvious deformities Neurologic:  Normal speech and language.  Bilateral lower extremity weakness on exam with minimal patient effort Skin:  Skin is warm and dry. No rash noted. Psychiatric: Mood and affect are normal. Speech and behavior are normal.  ____________________________________________   LABS (all labs ordered are listed, but only abnormal results are displayed)  Labs Reviewed  COMPREHENSIVE METABOLIC PANEL - Abnormal; Notable for the following components:      Result Value   Glucose, Bld 107 (*)    Creatinine, Ser 1.03 (*)    GFR, Estimated 59 (*)    All other components within normal limits  URINALYSIS, COMPLETE (UACMP) WITH MICROSCOPIC - Abnormal; Notable for the following components:   Color, Urine YELLOW (*)     APPearance HAZY (*)    Hgb urine dipstick MODERATE (*)    Ketones, ur 5 (*)    All other components within normal limits  CBC WITH DIFFERENTIAL/PLATELET  URINE DRUG SCREEN, QUALITATIVE ([facility] ONLY)  TROPONIN I (HIGH SENSITIVITY)  TROPONIN I (HIGH SENSITIVITY)   ____________________________________________  EKG  ED ECG REPORT I, Naaman Plummer, the attending physician, personally viewed and interpreted this ECG.  Date: 07/08/2021 EKG Time: 1055 Rate: 57 Rhythm: normal sinus rhythm QRS Axis: normal Intervals: normal ST/T Wave abnormalities: normal Narrative Interpretation: no evidence of acute ischemia  ____________________________________________  RADIOLOGY  ED MD interpretation: CT of the head without contrast shows no evidence of acute abnormalities including no intracerebral hemorrhage, obvious masses, or significant edema  Official radiology report(s): CT Head Wo Contrast  Result Date: 07/08/2021 CLINICAL DATA:  Weakness EXAM: CT HEAD WITHOUT CONTRAST TECHNIQUE: Contiguous axial images were obtained from the base of the skull through the  vertex without intravenous contrast. COMPARISON:  02/09/2019 FINDINGS: Brain: There is atrophy and chronic small vessel disease changes. No acute intracranial abnormality. Specifically, no hemorrhage, hydrocephalus, mass lesion, acute infarction, or significant intracranial injury. Vascular: No hyperdense vessel or unexpected calcification. Skull: No acute calvarial abnormality. Sinuses/Orbits: No acute findings Other: The IMPRESSION: Atrophy, chronic microvascular disease. No acute intracranial abnormality. Electronically Signed   By: Rolm Baptise M.D.   On: 07/08/2021 14:00    ____________________________________________   PROCEDURES  Procedure(s) performed (including Critical Care):  Procedures   ____________________________________________   INITIAL IMPRESSION / ASSESSMENT AND PLAN / ED COURSE  As part of my medical decision  making, I reviewed the following data within the electronic medical record, if available:  Nursing notes reviewed and incorporated, Labs reviewed, EKG interpreted, Old chart reviewed, Radiograph reviewed and Notes from prior ED visits reviewed and incorporated        Patient is a 70 year old female who presents for generalized weakness that is intermittent and worsened in her lower extremities.  On exam patient is unable to keep her legs off the bed for any significant period of time.  Laboratory and radiologic evaluation not show any red flag symptomatology or explanation for patient's inability to use her lower extremities.  Dispo: Patient will require assessment by physical and Occupational Therapy in order to make appropriate disposition  Care of this patient will be signed out to the oncoming physician at the end of my shift.  All pertinent patient information conveyed and all questions answered.  All further care and disposition decisions will be made by the oncoming physician.     ____________________________________________   FINAL CLINICAL IMPRESSION(S) / ED DIAGNOSES  Final diagnoses:  Generalized weakness  Ambulatory dysfunction     ED Discharge Orders     None        Note:  This document was prepared using Dragon voice recognition software and may include unintentional dictation errors.    Naaman Plummer, MD 07/08/21 [phone]

## 2021-07-08 NOTE — ED Triage Notes (Signed)
Arrived by EMS from home. Family reports patient became weak and incontinent last night. Baseline able to ambulate with walker. EMS reports patient could not ambulate with them but could stand with X2 assistance. Alert to self and location. Disoriented to situation.

## 2021-07-08 NOTE — TOC Initial Note (Signed)
Transition of Care [facility]) - Initial/Assessment Note    Patient Details  Name: Jill Smith MRN: [id number] Date of Birth: [birth year]  Transition [facility]) CM/SW Contact:    Berenice Bouton, LCSW Phone Number: 07/08/2021, 5:08 PM  Clinical Narrative:     The patient is a 71 year old female who presented to ED from home via EMS due to weakness and incontinence. TOC CM/SW met with patient at bedside, explain HIPPA, and explained reason for the consult.   Patient noted that she lives with her 18 year old sister.  She noted that "I had a fall on Thursday in the house."  She reported that she is able to attend to all ADLs however after her fall she has been feeling weak. CSW discussed with the patient that she might need rehabilitating/SNF.  CSW explained the process and also informed the patient that PT will be evaluating her sometime today or tomorrow. Patient demonstrated good understanding. Patient gave CSW permission to start SNF process should she need discharge to facility.    Expected Discharge Plan: Skilled Nursing Facility Barriers to Discharge: Continued Medical Work up   Patient Goals and CMS Choice Patient states their goals for this hospitalization and ongoing recovery are:: Wants to go to SNF to get strong. Would like to continue living independently CMS Medicare.gov Compare Post Acute Care list provided to:: Patient Choice offered to / list presented to : Patient  Expected Discharge Plan and Services Expected Discharge Plan: [place] In-house Referral: Clinical Social Work   Post Acute Care Choice: [place] Living arrangements for the past 2 months: Apartment                   Prior Living Arrangements/Services Living arrangements for the past 2 months: Apartment Lives with:: Siblings (28 year old sister) Patient language and need for interpreter reviewed:: No        Need for Family Participation in Patient Care: No (Comment) Care  giver support system in place?: Yes (comment) (Patient lives with her sister)   Criminal Activity/Legal Involvement Pertinent to Current Situation/Hospitalization: No - Comment as needed  Activities of Daily Living   Permission Sought/Granted Permission sought to share information with : Facility Sport and exercise psychologist, Case Manager, Family Supports, PCP Permission granted to share information with : Yes, Verbal Permission Granted  Share Information with NAME: Gardiner Sleeper (Sister)   [phone] (Mobile)  Permission granted to share info w AGENCY: [facility]       Emotional Assessment Appearance:: Appears stated age, Disheveled Attitude/Demeanor/Rapport: Lethargic, Gracious Affect (typically observed): Calm, Accepting Orientation: : Oriented to Self, Oriented to Place, Oriented to  Time, Oriented to Situation Alcohol / Substance Use: Not Applicable Psych Involvement: No (comment)  Admission diagnosis:  weakness Patient Active Problem List   Diagnosis Date Noted   Back pain 02/09/2019   PCP:  [facility] [place], [place] [address] [place] [id number] Phone: [phone] Fax: [phone]   Social Determinants of Health (SDOH) Interventions    Readmission Risk Interventions No flowsheet data found.

## 2021-07-08 NOTE — ED Notes (Signed)
Jill Smith @ [phone] wants to be called when OT PT sees the patient.  She is adamant that she does not want the patient to be placed in a SNF.  Only wants PT to come into their home to help the patient.  Advised that I would make the appropriate documentation.

## 2021-07-08 NOTE — ED Notes (Signed)
Responded to call bell. Pt requesting to be turned on right side to take pressure off left buttock. Pt repositioned and pillow placed under left side. Pt resting comfortably.

## 2021-07-09 DIAGNOSIS — R531 Weakness: Secondary | ICD-10-CM | POA: Diagnosis not present

## 2021-07-09 LAB — RESP PANEL BY RT-PCR (FLU A&B, COVID) ARPGX2
Influenza A by PCR: NEGATIVE
Influenza B by PCR: NEGATIVE
SARS Coronavirus 2 by RT PCR: NEGATIVE

## 2021-07-09 NOTE — ED Notes (Signed)
Md notified about pt's persistent high BP after medication.

## 2021-07-09 NOTE — Progress Notes (Addendum)
OT Cancellation Note  Patient Details Name: Jill Smith MRN: [id number] DOB: [birth year]   Cancelled Treatment:    Reason Eval/Treat Not Completed: Medical issues which prohibited therapy. Orders received and chart reviewed. Upon arrival to room this AM, pt with elevated BP (200/88), outside of safe parameters for therapy evaluation. RN informed. Since initial attempt, pt continues to have elevated BP, with most recent BPs 197/91, 193/92, 197/78. OT will follow remotely, and evaluate when pt is medically appropriate.    Fredirick Maudlin, OTR/L [place]

## 2021-07-09 NOTE — ED Notes (Signed)
Notified pharmacy for missing vitamin E dose.  Only 100 units available to administer, pt requires 800 units per order

## 2021-07-09 NOTE — ED Provider Notes (Signed)
Emergency Medicine Observation Re-evaluation Note  Jill Smith is a 70 y.o. female, seen on rounds today.  Pt initially presented to the ED for complaints of Weakness Currently, the patient is resting comfortably without complaints.  Physical Exam  BP (!) 192/81   Pulse 72   Temp 98.1 F (36.7 C) (Oral)   Resp 15   Ht 5\' 9"  (1.753 m)   Wt 136.1 kg   SpO2 100%   BMI 44.30 kg/m  Physical Exam Gen: No acute distress  Resp: Normal rise and fall of chest Neuro: Moving all four extremities Psych: Resting currently, calm and cooperative when awake    ED Course / MDM  EKG:   I have reviewed the labs performed to date as well as medications administered while in observation.  Recent changes in the last 24 hours include no acute events overnight.  Plan  Current plan is for social work evaluation, PT/OT evaluation with possible discharge home with home health resources.    Dazia Lippold, Delice Bison, DO 07/09/21 [phone]

## 2021-07-09 NOTE — ED Notes (Signed)
Pt provided with breakfast

## 2021-07-09 NOTE — TOC Progression Note (Signed)
Transition of Care [facility]) - Progression Note    Patient Details  Name: Jill Smith MRN: [id number] Date of Birth: 03/22/1951  Transition of [facility]) CM/SW [place], LCSW Phone Number: 07/09/2021, 9:58 AM  Clinical Narrative:   CSW follow-up with the patient in the AM at ED bedside.  The patient reported that she would like to discharge home with Home Health.  Family (sister) would like for patient to return home.    CSW will chack in with patient after PT/OT evaluation/recommendations.    Expected Discharge Plan: [place] Barriers to Discharge: Continued Medical Work up  Expected Discharge Plan and Services Expected Discharge Plan: [place] In-house Referral: Clinical Social Work   Post Acute Care Choice: [place] arrangements for the past 2 months: Apartment                   Social Determinants of Health (SDOH) Interventions    Readmission Risk Interventions No flowsheet data found.

## 2021-07-09 NOTE — Evaluation (Signed)
Occupational Therapy Evaluation Patient Details Name: Jill Smith MRN: [id number] DOB: October 30, 1951 Today's Date: 07/09/2021    History of Present Illness Pt admitted for complaints of weakness/incontience. Reports history of fall (most recent on Thursday). PMH of bipolar, depression, hypertension, morbid obesity, and schizophrenia   Clinical Impression   Pt seen for OT evaluation this afternoon (most recent BP 164/70). Upon arrival to room, pt awake, with trunk leaning laterally to left and pt appearing to have food/drink spillage on hospital gown. Pt reporting no pain and agreeable to OT evaluation/treatment. Pt is a questionable historian, giving inconsistent information regarding PLOF between OT and PT evaluations this date. Pt reported to this author that she requires +2 person assistance to transfer from bed to [facility] and that personal care assistant helps with bathing and other ADLs 6x/week; pt was unable to quantify level of assistance required from PCA at baseline. Plan to confirm PLOF with family as able.  Pt currently presents with decreased balance, strength, and activity tolerance, and requires MAX A/physical supports (i.e., pillow) to maintain trunk in midline position while using b/l UE in fowler's position in bed, SET-UP assistance to feed self (pt able to bring cup to mouth, with spillage occuring during 3/4 of attempts), and MIN A to don/doff hospital gown at bed-level. Pt would benefit from additional skilled OT services to maximize return to PLOF and minimize risk of future falls, injury, caregiver burden, and readmission. Upon discharge, recommend SNF.    Follow Up Recommendations  SNF    Equipment Recommendations  Other (comment) (defer to next venue of care)       Precautions / Restrictions Precautions Precautions: Fall Restrictions Weight Bearing Restrictions: No      Mobility Bed Mobility Overal bed mobility: Needs Assistance Bed Mobility: Rolling Rolling: Max  assist   Supine to sit: Max assist;Total assist     General bed mobility comments: MAX A to roll laterally to place pillow under L trunk/shoulder    Transfers                 General transfer comment: not safe to perform    Balance Overall balance assessment: Needs assistance Sitting-balance support: Bilateral upper extremity supported;Feet supported Sitting balance-Leahy Scale: Poor Sitting balance - Comments: Pt with poor trunk control; heavy lateral lean to left while in fowler's position in bed, requiring MAX A and/or physical supports (i.e., pillow) to maintain midline                                   ADL either performed or assessed with clinical judgement   ADL Overall ADL's : Needs assistance/impaired Eating/Feeding: Supervision/ safety;Set up;Sitting Eating/Feeding Details (indicate cue type and reason): Pt required set-up assistance to open lid of drink container. While in fowler's position in bed, pt able to bring cup to mouth, with spillage occuring during 3/4 of attempts             Upper Body Dressing : Minimal assistance;Bed level Upper Body Dressing Details (indicate cue type and reason): to don/doff hospital gown. Pt required increased assistance to doff/don sleeve on L UE and required increase time/effort                          Pertinent Vitals/Pain Pain Assessment: No/denies pain Faces Pain Scale: Hurts even more Pain Location: abdomen due to nausea Pain Descriptors / Indicators: Restless Pain  Intervention(s): Limited activity within patient's tolerance;Repositioned        Extremity/Trunk Assessment Upper Extremity Assessment Upper Extremity Assessment: RUE deficits/detail;LUE deficits/detail RUE Deficits / Details: 3/5 shoulder flexion, 3/5 elbow flexion, and 2/5 grip strength LUE Deficits / Details: Difficultly with shoulder flexion >30 degrees. Pt with 3/5 elbow flexion and 2/5 grip strength   Lower Extremity  Assessment Lower Extremity Assessment: Defer to PT evaluation       Communication Communication Communication: Other (comment) (very soft spoken)   Cognition Arousal/Alertness: Awake/alert Behavior During Therapy: Flat affect Overall Cognitive Status: No family/caregiver present to determine baseline cognitive functioning                                 General Comments: Pt is a questionable historian, giving inconsistent information regarding PLOF between PT and OT evaluation this date. Pt agreeable to session, although appears to be self-limiting at times, requesting assistance from OT before attempting independently.   General Comments       Exercises Exercises: Other exercises General Exercises - Upper Extremity Elbow Flexion: AROM;Strengthening;Both;10 reps Digit Composite Flexion: AROM;10 reps;Supine General Exercises - Lower Extremity Ankle Circles/Pumps: AAROM;Both;10 reps;Supine        Home Living Family/patient expects to be discharged to:: Private residence Living Arrangements: Other relatives (sister and 2 nephews) Available Help at Discharge: Family;Available 24 hours/day;Other (Comment);Personal care attendant (aide comes to pt's house 6x/week) Type of Home: House Home Access: [place]: One [place]: [place] - 4 wheels;Wheelchair - manual;Hospital bed   Additional Comments: Pt is a questionable historian, giving inconsistent information regarding PLOF between PT and OT evaluation this date. Plan to confirm with family as able.      Prior Functioning/Environment Level of Independence: Needs assistance  Gait / Transfers Assistance Needed: Pt reports that she requires +2 person assistance to transfer from bed to [facility]. ADL's / Homemaking Assistance Needed: Pt reports that personal care assistant helps with bathing and other ADLs 6x/week; pt unable to quantify level of assistance required at  baseline. Pt reports that family assists with IADLs            OT Problem List: Decreased strength;Decreased activity tolerance;Decreased range of motion;Impaired balance (sitting and/or standing);Decreased coordination;Decreased safety awareness      OT Treatment/Interventions: Self-care/ADL training;Therapeutic exercise;DME and/or AE instruction;Therapeutic activities;Visual/perceptual remediation/compensation;Patient/family education;Balance training    OT Goals(Current goals can be found in the care plan section) Acute Rehab OT Goals Patient Stated Goal: to go home OT Goal Formulation: With patient Time For Goal Achievement: 07/23/21 Potential to Achieve Goals: Fair ADL Goals Pt Will Perform Grooming: with set-up;bed level Pt Will Perform Upper Body Bathing: with min assist;bed level Pt Will Perform Upper Body Dressing: with min assist;sitting  OT Frequency: Min 1X/week    AM-PAC OT "6 Clicks" Daily Activity     Outcome Measure Help from another person eating meals?: A Little Help from another person taking care of personal grooming?: A Little Help from another person toileting, which includes using toliet, bedpan, or urinal?: Total Help from another person bathing (including washing, rinsing, drying)?: A Lot Help from another person to put on and taking off regular upper body clothing?: A Little Help from another person to put on and taking off regular lower body clothing?: Total 6 Click Score: 13   End  of Session Nurse Communication: Mobility status  Activity Tolerance: Patient tolerated treatment well Patient left: in bed;with call bell/phone within reach  OT Visit Diagnosis: Muscle weakness (generalized) (M62.81);Repeated falls (R29.6)                Time: 1455-1520 OT Time Calculation (min): 25 min Charges:  OT General Charges $OT Visit: 1 Visit OT Evaluation $OT Eval Moderate Complexity: 1 Mod OT Treatments $Self Care/Home Management : 8-22 mins  Fredirick Maudlin, OTR/L [place]

## 2021-07-09 NOTE — Evaluation (Signed)
Physical Therapy Evaluation Patient Details Name: Jill Smith MRN: [id number] DOB: 07-20-1951 Today's Date: 07/09/2021   History of Present Illness  Pt admitted for complaints of weakness/incontience. Reports history of fall (most recent on Thursday). No PMH in chart.  Clinical Impression  Pt is a pleasant 70 year old female who was admitted for complaints of weakness reports falls history. Appears off her baseline and demonstrates decreased safety awareness/insight into situation. She reports "everything will be fine once I get home." "You can't help me, you are too small. My sister will help me" Pt performs bed mobility with max/total assist. Unable to fully complete transfer due to weakness. Pt demonstrates deficits with strength/mobility. Not safe to dc home at this time. Would benefit from skilled PT to address above deficits and promote optimal return to PLOF; recommend transition to STR upon discharge from acute hospitalization.     Follow Up Recommendations SNF (pt currently refusing)    Equipment Recommendations  Hospital bed (hoyer lift if returns home)    Recommendations for Other Services       Precautions / Restrictions Precautions Precautions: Fall Restrictions Weight Bearing Restrictions: No      Mobility  Bed Mobility Overal bed mobility: Needs Assistance Bed Mobility: Supine to Sit;Rolling Rolling: Max assist   Supine to sit: Max assist;Total assist     General bed mobility comments: no initiation made by patient. Needs total assist to begin movement. Poor trunk control with heavy assist required. Unable to attain complete transition to EOB with 1 person. Anticipate need for +2 assist for all mobility at this time.    Transfers                 General transfer comment: not safe to perform  Ambulation/Gait                Stairs            Wheelchair Mobility    Modified Rankin (Stroke Patients Only)       Balance Overall  balance assessment: History of Falls                                           Pertinent Vitals/Pain Pain Assessment: Faces Faces Pain Scale: Hurts even more Pain Location: abdomen due to nausea Pain Descriptors / Indicators: Restless Pain Intervention(s): Limited activity within patient's tolerance;Repositioned    Home Living Family/patient expects to be discharged to:: Private residence Living Arrangements: Other relatives (sister and 2 nephews) Available Help at Discharge: Family;Available 24 hours/day Type of Home: House Home Access: Ramped entrance     Home Layout: One level Home Equipment: Walker - 4 wheels;Wheelchair - manual Additional Comments: pt is poor historian, difficult to understand due to lethargy. Pt also reports hospital bed at home. Unsure of accuracy    Prior Function Level of Independence: Needs assistance   Gait / Transfers Assistance Needed: reports she was able to ambulate short distances with rollator and uses WC (family assist) for community.  ADL's / Homemaking Assistance Needed: reports she is able to perform transfers without assist        Hand Dominance        Extremity/Trunk Assessment   Upper Extremity Assessment Upper Extremity Assessment: Generalized weakness (B UE grossly 2/5 with weak grips)    Lower Extremity Assessment Lower Extremity Assessment: Generalized weakness (B LE grossly 2-/5; question poor  effort)       Communication   Communication: No difficulties  Cognition Arousal/Alertness: Lethargic Behavior During Therapy: WFL for tasks assessed/performed Overall Cognitive Status: Difficult to assess                                 General Comments: falls asleep mid sentence. Appears to lack insight to deficits      General Comments      Exercises Other Exercises Other Exercises: attempted ther-ex, however poor participation. Attempted 5 reps of B quad sets, SLRs, and AP. Unable to  complete full ROM on AP. HR ranged in 40s throughout session with BP elevated; RN notified   Assessment/Plan    PT Assessment Patient needs continued PT services  PT Problem List Decreased strength;Decreased activity tolerance;Decreased balance;Decreased mobility;Decreased cognition;Decreased safety awareness;Obesity       PT Treatment Interventions Gait training;DME instruction;Therapeutic activities;Therapeutic exercise;Balance training    PT Goals (Current goals can be found in the Care Plan section)  Acute Rehab PT Goals Patient Stated Goal: to go home PT Goal Formulation: With patient Time For Goal Achievement: 07/23/21 Potential to Achieve Goals: Fair    Frequency Min 2X/week   Barriers to discharge        Co-evaluation               AM-PAC PT "6 Clicks" Mobility  Outcome Measure Help needed turning from your back to your side while in a flat bed without using bedrails?: Total Help needed moving from lying on your back to sitting on the side of a flat bed without using bedrails?: Total Help needed moving to and from a bed to a chair (including a wheelchair)?: Total Help needed standing up from a chair using your arms (e.g., wheelchair or bedside chair)?: Total Help needed to walk in hospital room?: Total Help needed climbing 3-5 steps with a railing? : Total 6 Click Score: 6    End of Session   Activity Tolerance: Patient limited by fatigue Patient left: in bed Nurse Communication: Mobility status PT Visit Diagnosis: Muscle weakness (generalized) (M62.81);History of falling (Z91.81);Difficulty in walking, not elsewhere classified (R26.2)    Time: 3976-7341 PT Time Calculation (min) (ACUTE ONLY): 23 min   Charges:   PT Evaluation $PT Eval High Complexity: 1 High PT Treatments $Therapeutic Exercise: 8-22 mins        Greggory Stallion, PT, DPT [phone]   Jill Smith 07/09/2021, 2:34 PM

## 2021-07-09 NOTE — ED Notes (Addendum)
This Rn at bedside to talk with pt's sister. Per sister- she believes that her pcp increased her Seroquel dose approx 10 days ago, for the last 3 days pt has had decrease in function. She believes this to be the cause of pt's weakness. Prior to seroquel increase, pt was having episodes of pacing in the home and increased insomnia.  Sister also reports at baseline pt is unable to perform own ADL's without assistance. Reports she needs help bathing/ is unable to shower. Pt uses walker to ambulate at home.

## 2021-07-10 DIAGNOSIS — R531 Weakness: Secondary | ICD-10-CM | POA: Diagnosis not present

## 2021-07-10 LAB — CBG MONITORING, ED: Glucose-Capillary: 95 mg/dL (ref 70–99)

## 2021-07-10 NOTE — ED Notes (Signed)
Pt moved up in the bed with this RN and ED tech, pt unable to scoot herself up, pt able to bend knees slightly, but not able to help move herself. Pt's sister at bedside and states that this is a result of a psych medication that was given to her, "it messed her up." this RN discussed with the sister that she wouldn't be safe to walk if she is unable to bend her knees and move herself in the bed. Pt's sister states that "she just doesn't want to, she can do it if she wants to"  Pt given her breakfast tray and sat upright in the bed.

## 2021-07-10 NOTE — ED Notes (Signed)
PT at bedside with pt.  

## 2021-07-10 NOTE — ED Notes (Signed)
Pt resting with her eyes closed, no distress noted, cont to monitor

## 2021-07-10 NOTE — ED Notes (Signed)
Pt assisted to sit up in bed and lunch tray opened and mayo applied to sandwich per pt's request and apple juice opened for pt as well as her bag of pretzels

## 2021-07-10 NOTE — Progress Notes (Signed)
Physical Therapy Treatment Patient Details Name: Jill Smith MRN: [id number] DOB: 02/03/1951 Today's Date: 07/10/2021    History of Present Illness Pt admitted for complaints of weakness/incontience. Reports history of fall (most recent on Thursday). PMH of bipolar, depression, hypertension, morbid obesity, and schizophrenia    PT Comments    Pt is making limited progress towards goals with poor initiation of movement. Continues to be lethargic throughout session with heavy cues for keeping eyes open. Sister at bedside with attempts to maintain alertness. +2 assist required for bed mobility and assist to maintain upright posture. Heavy L lateral lean. Sister reports pt has been weaker since beginning new meds and is hopeful for return to function soon. Now agreeable to SNF stay to work towards functional independence. RN notified.  Follow Up Recommendations  SNF (sister agreeable)     Equipment Recommendations  Hospital bed (hoyer lift if returns home)    Recommendations for Other Services       Precautions / Restrictions Precautions Precautions: Fall Restrictions Weight Bearing Restrictions: No    Mobility  Bed Mobility Overal bed mobility: Needs Assistance Bed Mobility: Supine to Sit;Sit to Supine     Supine to sit: Max assist;+2 for physical assistance Sit to supine: Total assist;+2 for physical assistance   General bed mobility comments: needs heavy assist for participation. Able to minimally initiate B LE movement towards EOB. Max assist +2 for movement completion. Once seated at EOB, L lateral lean noted with mod assist to maintain upright posture. Able to tolerate sitting for 5-8 mins.    Transfers Overall transfer level: Needs assistance Equipment used: Rolling walker (2 wheeled) Transfers: Sit to/from Stand Sit to Stand: Total assist;+2 physical assistance;From elevated surface         General transfer comment: attempted x 1 attempt, however unable to  unweight buttocks off bed. Poor participation noted  Ambulation/Gait             General Gait Details: unable   Stairs             Wheelchair Mobility    Modified Rankin (Stroke Patients Only)       Balance Overall balance assessment: Needs assistance Sitting-balance support: Bilateral upper extremity supported;Feet supported Sitting balance-Leahy Scale: Poor Sitting balance - Comments: heavy L lateral leaning                                    Cognition Arousal/Alertness: Lethargic Behavior During Therapy: Flat affect Overall Cognitive Status: Impaired/Different from baseline                                 General Comments: very lethargic, needs cues to maintain alertness throughout session. Appeared confused      Exercises Other Exercises Other Exercises: supine ther-ex performed on B LE including SAQ, hip abd/add, and AP. Poor participation. ~5 reps with max assist    General Comments        Pertinent Vitals/Pain Pain Assessment: No/denies pain    Home Living                      Prior Function            PT Goals (current goals can now be found in the care plan section) Acute Rehab PT Goals Patient Stated Goal: to go home PT Goal Formulation:  With patient Time For Goal Achievement: 07/23/21 Potential to Achieve Goals: Fair Progress towards PT goals: Progressing toward goals    Frequency    Min 2X/week      PT Plan Current plan remains appropriate    Co-evaluation              AM-PAC PT "6 Clicks" Mobility   Outcome Measure  Help needed turning from your back to your side while in a flat bed without using bedrails?: Total Help needed moving from lying on your back to sitting on the side of a flat bed without using bedrails?: Total Help needed moving to and from a bed to a chair (including a wheelchair)?: Total Help needed standing up from a chair using your arms (e.g., wheelchair or  bedside chair)?: Total Help needed to walk in hospital room?: Total Help needed climbing 3-5 steps with a railing? : Total 6 Click Score: 6    End of Session   Activity Tolerance: Patient limited by fatigue Patient left: in bed;with bed alarm set Nurse Communication: Mobility status PT Visit Diagnosis: Muscle weakness (generalized) (M62.81);History of falling (Z91.81);Difficulty in walking, not elsewhere classified (R26.2)     Time: 1030-1055 PT Time Calculation (min) (ACUTE ONLY): 25 min  Charges:  $Therapeutic Exercise: 8-22 mins $Therapeutic Activity: 8-22 mins                     Greggory Stallion, PT, DPT [phone]    Rosamary Boudreau 07/10/2021, 1:28 PM

## 2021-07-11 DIAGNOSIS — R531 Weakness: Secondary | ICD-10-CM | POA: Diagnosis not present

## 2021-07-11 LAB — RESP PANEL BY RT-PCR (FLU A&B, COVID) ARPGX2
Influenza A by PCR: NEGATIVE
Influenza B by PCR: NEGATIVE
SARS Coronavirus 2 by RT PCR: NEGATIVE

## 2021-07-11 NOTE — TOC Progression Note (Signed)
Transition of Care [facility]) - Progression Note    Patient Details  Name: Jill Smith MRN: [id number] Date of Birth: Mar 08, 1951  Transition of Care [facility]) CM/SW [place], [place] Phone Number: [phone] 07/11/2021, 9:08 AM  Clinical Narrative:     CSW spoke with patient and she has agreed to SNF rehab placement.  CSW spoke with patient's sister Gardiner Sleeper  [phone] [facility]), who stated she felt SNF placement would be the best option for the patietn to regain her strength.  SNF placement started.   PASRR# [id number]  Expected Discharge Plan: [place] Barriers to Discharge: Continued Medical Work up  Expected Discharge Plan and Services Expected Discharge Plan: [place] In-house Referral: Clinical Social Work   Post Acute Care Choice: [place] arrangements for the past 2 months: Apartment                                       Social Determinants of Health (SDOH) Interventions    Readmission Risk Interventions No flowsheet data found.

## 2021-07-11 NOTE — TOC Progression Note (Signed)
Transition of Care [facility]) - Progression Note    Patient Details  Name: Jill Smith MRN: [id number] Date of Birth: 02-15-1951  Transition of Care [facility]) CM/SW [place], [place] Phone Number: [phone] 07/11/2021, 2:11 PM  Clinical Narrative:     CSW spoke with Tanya [facility] to confirm patient bed offer.  CSW called patient's sister Gardiner Sleeper [phone] and was unable to leave a voicemail, voicemail is full.  CSW will try calling [name] again.  Expected Discharge Plan: [place] Barriers to Discharge: Continued Medical Work up  Expected Discharge Plan and Services Expected Discharge Plan: [place] In-house Referral: Clinical Social Work   Post Acute Care Choice: [place] arrangements for the past 2 months: Apartment                                       Social Determinants of Health (SDOH) Interventions    Readmission Risk Interventions No flowsheet data found.

## 2021-07-11 NOTE — NC FL2 (Signed)
[place] LEVEL OF CARE SCREENING TOOL     IDENTIFICATION  Patient Name: Jill Smith Birthdate: 07-Dec-1951 Sex: female Admission Date (Current Location): 07/08/2021  [place] and [place] Number:  Selena Lesser [id number] [place] and Address:  [facility], [address] [place], [place] [address]      Provider Number: [id number]  Attending Physician Name and Address:  No att. providers found  Relative Name and Phone Number:  Gardiner Sleeper (Sister)   [phone] (Mobile)    Current Level of Care: Hospital Recommended Level of Care: [place] Prior Approval Number:    Date Approved/Denied:   PASRR Number: [id number]  Discharge Plan: SNF    Current Diagnoses: Patient Active Problem List   Diagnosis Date Noted   Back pain 02/09/2019    Orientation RESPIRATION BLADDER Height & Weight     Self, Time, Situation, Place  Normal Incontinent Weight: 300 lb (136.1 kg) Height:  5\' 9"  (175.3 cm)  BEHAVIORAL SYMPTOMS/MOOD NEUROLOGICAL BOWEL NUTRITION STATUS      Incontinent Diet  AMBULATORY STATUS COMMUNICATION OF NEEDS Skin   Limited Assist Verbally Normal                       Personal Care Assistance Level of Assistance  Bathing, Dressing, Feeding, Total care Bathing Assistance: Limited assistance Feeding assistance: Independent Dressing Assistance: Limited assistance Total Care Assistance: Limited assistance   Functional Limitations Info  Sight, Hearing, Speech Sight Info: Adequate Hearing Info: Adequate Speech Info: Adequate    SPECIAL CARE FACTORS FREQUENCY  PT (By licensed PT), OT (By licensed OT)     PT Frequency: 5X per week OT Frequency: 5X per week            Contractures Contractures Info: Not present    Additional Factors Info                  Current Medications (07/11/2021):  This is the current hospital active medication list Current Facility-Administered Medications   Medication Dose Route Frequency Provider Last Rate Last Admin   amLODipine (NORVASC) tablet 5 mg  5 mg Oral Daily Ward, Kristen N, DO   5 mg at 07/10/21 1020   aspirin EC tablet 81 mg  81 mg Oral Daily Ward, Kristen N, DO   81 mg at 07/10/21 1021   brinzolamide (AZOPT) 1 % ophthalmic suspension 1 drop  1 drop Left Eye TID Ward, Kristen N, DO   1 drop at 07/10/21 2226   And   brimonidine (ALPHAGAN) 0.2 % ophthalmic solution 1 drop  1 drop Left Eye TID Ward, Kristen N, DO   1 drop at 07/10/21 2226   docusate sodium (COLACE) capsule 100 mg  100 mg Oral Daily PRN Ward, Kristen N, DO       isosorbide mononitrate (IMDUR) 24 hr tablet 60 mg  60 mg Oral Daily Ward, Kristen N, DO   60 mg at 07/10/21 1022   lisinopril (ZESTRIL) tablet 20 mg  20 mg Oral Daily Ward, Kristen N, DO   20 mg at 07/10/21 1021   metoprolol tartrate (LOPRESSOR) tablet 50 mg  50 mg Oral BID Ward, Kristen N, DO   50 mg at 07/10/21 2219   QUEtiapine (SEROQUEL) tablet 100 mg  100 mg Oral QHS Ward, Kristen N, DO   100 mg at 07/10/21 2220   vitamin E capsule 800 Units  800 Units Oral BID Ward, Kristen N, DO   800 Units at 07/10/21 2219  Current Outpatient Medications  Medication Sig Dispense Refill   amLODipine (NORVASC) 5 MG tablet Take 5 mg by mouth daily.     aspirin EC 81 MG tablet Take 81 mg by mouth daily.     docusate sodium (COLACE) 100 MG capsule Take 100 mg by mouth daily as needed for mild constipation.      isosorbide mononitrate (IMDUR) 60 MG 24 hr tablet Take 60 mg by mouth daily.     lisinopril (PRINIVIL,ZESTRIL) 20 MG tablet Take 20 mg by mouth daily.     metoprolol tartrate (LOPRESSOR) 50 MG tablet Take 50 mg by mouth 2 (two) times daily.      paliperidone (INVEGA SUSTENNA) 234 MG/1.5ML SUSY injection Inject 234 mg into the muscle every 30 (thirty) days.     QUEtiapine (SEROQUEL) 100 MG tablet Take 100 mg by mouth at bedtime.     SIMBRINZA 1-0.2 % SUSP Place 1 drop into the left eye 3 (three) times daily.      vitamin E 400 UNIT capsule Take 800 Units by mouth 2 (two) times daily.        Discharge Medications: Please see discharge summary for a list of discharge medications.  Relevant Imaging Results:  Relevant Lab Results:   Additional Information SS# [id number]  Adelene Amas, LCSWA

## 2021-07-11 NOTE — ED Notes (Addendum)
This RN at bedside to collect repeat COVID swab for placement. Pt requesting to contact sister first. Phone provided to pt at this time.

## 2021-07-11 NOTE — ED Notes (Signed)
Pt given phone to call sister again

## 2021-07-11 NOTE — ED Notes (Signed)
Pt repositioned in bed to eat breakfast tray. Pt denies any further needs at this time.

## 2021-07-11 NOTE — ED Notes (Addendum)
Pt not sent incorrect food tray. Dietary called to send correct tray

## 2021-07-11 NOTE — ED Notes (Signed)
HOB adjusted for pt and given cup of water as requested.

## 2021-07-12 DIAGNOSIS — R531 Weakness: Secondary | ICD-10-CM | POA: Diagnosis not present

## 2021-07-12 NOTE — ED Notes (Signed)
Patient is resting comfortably. 

## 2021-07-12 NOTE — ED Notes (Signed)
Pt resting, waiting on transport. NAD.

## 2021-07-12 NOTE — TOC Progression Note (Signed)
Transition of Care [facility]) - Progression Note    Patient Details  Name: Jill Smith MRN: [id number] Date of Birth: April 22, 1951  Transition of Care [facility]) CM/SW [place], [place] Phone Number: [phone] 07/12/2021, 8:43 AM  Clinical Narrative:     Pending insurance authorization for SNF placement at [facility].  CSW spoke with patient's sister Gardiner Sleeper [phone].  CSW stated I would update [name] once I had confirmation from [employer].  Expected Discharge Plan: [place] Barriers to Discharge: Continued Medical Work up  Expected Discharge Plan and Services Expected Discharge Plan: [place] In-house Referral: Clinical Social Work   Post Acute Care Choice: [place] arrangements for the past 2 months: Apartment                                       Social Determinants of Health (SDOH) Interventions    Readmission Risk Interventions No flowsheet data found.

## 2021-07-12 NOTE — TOC Transition Note (Addendum)
Transition of Care [facility]) - CM/SW Discharge Note   Patient Details  Name: TAIMANE STIMMEL MRN: [id number] Date of Birth: 03/19/1951  Transition of Care [facility]) CM/SW Contact:  Adelene Amas, [place] Phone Number: 07/12/2021, 9:31 AM   Clinical Narrative:     Patient will discharge to [facility], room# 808, report [phone].  EDP/ED staff updated, insurance Josem Kaufmann [phone].  Patient's sister Gardiner Sleeper [phone] updated.  Ms. Charlynn Grimes requested call when EMS arrives to pick up patient so she can meet her at the facility.   Final next level of care: [place] Barriers to Discharge: Continued Medical Work up   Patient Goals and CMS Choice Patient states their goals for this hospitalization and ongoing recovery are:: After speaking to family the patinet would like to go home with home health. Would like to continue living independently CMS Medicare.gov Compare Post Acute Care list provided to:: Patient Choice offered to / list presented to : Patient  Discharge Placement                       Discharge Plan and Services In-house Referral: Clinical Social Work   Post Acute Care Choice: Home Health                               Social Determinants of Health (SDOH) Interventions     Readmission Risk Interventions No flowsheet data found.

## 2021-07-12 NOTE — ED Notes (Signed)
Pt resting in NAD. Even and unlabored rise and fall of chest.

## 2021-07-12 NOTE — ED Notes (Signed)
Pt sat up, given breakfast, hands cleaned with wipe. Pt took meds whole with water. Pt given phone to call sister. Aware of transfer to [facility].

## 2022-01-04 ENCOUNTER — Encounter: Payer: Self-pay | Admitting: Emergency Medicine

## 2022-01-04 ENCOUNTER — Emergency Department: Payer: Medicare HMO

## 2022-01-04 ENCOUNTER — Emergency Department
Admission: EM | Admit: 2022-01-04 | Discharge: 2022-01-04 | Disposition: A | Discharge status: Home / Self Care | Payer: Medicare HMO | Attending: Emergency Medicine | Admitting: Emergency Medicine

## 2022-01-04 ENCOUNTER — Other Ambulatory Visit: Payer: Self-pay

## 2022-01-04 DIAGNOSIS — W01198A Fall on same level from slipping, tripping and stumbling with subsequent striking against other object, initial encounter: Secondary | ICD-10-CM | POA: Diagnosis not present

## 2022-01-04 DIAGNOSIS — S01511A Laceration without foreign body of lip, initial encounter: Secondary | ICD-10-CM | POA: Diagnosis present

## 2022-01-04 DIAGNOSIS — Y92009 Unspecified place in unspecified non-institutional (private) residence as the place of occurrence of the external cause: Secondary | ICD-10-CM | POA: Insufficient documentation

## 2022-01-04 DIAGNOSIS — Z23 Encounter for immunization: Secondary | ICD-10-CM | POA: Insufficient documentation

## 2022-01-04 DIAGNOSIS — I10 Essential (primary) hypertension: Secondary | ICD-10-CM | POA: Diagnosis not present

## 2022-01-04 DIAGNOSIS — W19XXXA Unspecified fall, initial encounter: Secondary | ICD-10-CM

## 2022-01-04 DIAGNOSIS — S0990XA Unspecified injury of head, initial encounter: Secondary | ICD-10-CM | POA: Insufficient documentation

## 2022-01-04 MED ORDER — TRAMADOL HCL 50 MG PO TABS
50.0000 mg | ORAL_TABLET | Freq: Once | ORAL | Status: AC
  Administered 2022-01-04: 50 mg via ORAL
  Filled 2022-01-04: qty 1

## 2022-01-04 MED ORDER — AMOXICILLIN-POT CLAVULANATE 875-125 MG PO TABS: 1.0000 | ORAL_TABLET | Freq: Two times a day (BID) | ORAL | 0 refills | Status: AC

## 2022-01-04 MED ORDER — LIDOCAINE HCL (PF) 1 % IJ SOLN
5.0000 mL | Freq: Once | INTRAMUSCULAR | Status: AC
  Administered 2022-01-04: 5 mL
  Filled 2022-01-04: qty 5

## 2022-01-04 MED ORDER — TETANUS-DIPHTH-ACELL PERTUSSIS 5-2.5-18.5 LF-MCG/0.5 IM SUSY
0.5000 mL | PREFILLED_SYRINGE | Freq: Once | INTRAMUSCULAR | Status: AC
  Administered 2022-01-04: 0.5 mL via INTRAMUSCULAR
  Filled 2022-01-04: qty 0.5

## 2022-01-04 MED ORDER — BENZOCAINE 20 % MT AERO
INHALATION_SPRAY | Freq: Once | OROMUCOSAL | Status: AC
  Filled 2022-01-04: qty 57

## 2022-01-04 NOTE — Discharge Instructions (Signed)
See your provider in 1 week for suture removal. Take the antibiotic as directed. Rinse the mouth with warm salty water daily.

## 2022-01-04 NOTE — ED Provider Notes (Signed)
[facility] Provider Note  Patient Contact: 4:23 PM (approximate)   History   Fall and Laceration   HPI  Jill Smith is a 71 y.o. female  presents to the ED, accompanied by family, after a witnessed mechanical fall at home. She apparently tripped walking around the bed, and fell hitting her lip on the bed frame. She presents with a complex, full-thickness laceration to the upper lip. It extends into the submucosal space and crosses the vermilion border. The patient is edentulous and does not endorse any gum injury or nosebleeds. She has history of schizophrenia, bipolar disorder, and HTN. She  denies any other injury or LOC.  Physical Exam   Triage Vital Signs: ED Triage Vitals  Enc Vitals Group     BP 01/04/22 1129 126/71     Pulse Rate 01/04/22 1129 69     Resp 01/04/22 1129 14     Temp 01/04/22 1129 98.3 F (36.8 C)     Temp Source 01/04/22 1129 Oral     SpO2 01/04/22 1129 98 %     Weight 01/04/22 1130 300 lb (136.1 kg)     Height 01/04/22 1130 5\' 9"  (1.753 m)     Head Circumference --      Peak Flow --      Pain Score 01/04/22 1129 6     Pain Loc --      Pain Edu? --      Excl. in [place]? --     Most recent vital signs: Vitals:   01/04/22 1129 01/04/22 1757  BP: 126/71 128/76  Pulse: 69 68  Resp: 14 18  Temp: 98.3 F (36.8 C)   SpO2: 98% 97%     General: Alert and in no acute distress. Eyes:  PERRL. EOMI Head: No acute traumatic findings Nose: No congestion/rhinorrhea/epistaxis Mouth/Throat: Mucous membranes are moist. Edentulous. Large complex, irregular full-thickness laceration to the right upper lip with extension through the vermilion border.  Neck: No stridor. No cervical spine tenderness to palpation. Cardiovascular:  Good peripheral perfusion Respiratory: Normal respiratory effort without tachypnea or retractions. Lungs CTAB.  Gastrointestinal: Bowel sounds 4 quadrants. Soft and nontender to palpation. No guarding or  rigidity. No palpable masses. No distention. No CVA tenderness. Musculoskeletal: Full range of motion to all extremities.  Neurologic:  CN XX-XII grossly intact. No gross focal neurologic deficits are appreciated.  Skin:   No rash noted   ED Results / Procedures / Treatments   Labs (all labs ordered are listed, but only abnormal results are displayed) Labs Reviewed - No data to display   EKG    RADIOLOGY  I personally viewed and evaluated these images as part of my medical decision making, as well as reviewing the written report by the radiologist.  ED Provider Interpretation: **}  CT HEAD WO CONTRAST (5MM)  Result Date: 01/04/2022 CLINICAL DATA:  Facial trauma EXAM: CT HEAD WITHOUT CONTRAST CT MAXILLOFACIAL WITHOUT CONTRAST TECHNIQUE: Multidetector CT imaging of the head and maxillofacial structures were performed using the standard protocol without intravenous contrast. Multiplanar CT image reconstructions of the maxillofacial structures were also generated. COMPARISON:  CT dated July ninth 2022 FINDINGS: CT HEAD FINDINGS Brain: Chronic white matter ischemic change. Old bilateral basal ganglia infarcts no evidence of acute infarction, hemorrhage, hydrocephalus, extra-axial collection or mass lesion/mass effect. Vascular: No hyperdense vessel or unexpected calcification. Skull: Normal. Negative for fracture or focal lesion. Other: None. CT MAXILLOFACIAL FINDINGS Osseous: No fracture or mandibular dislocation. No destructive  process. Orbits: Negative. No traumatic or inflammatory finding. Sinuses: Clear. Soft tissues: Negative. IMPRESSION: 1. No acute intracranial abnormality. 2. No evidence of facial bone fracture. Electronically Signed   By: Yetta Glassman M.D.   On: 01/04/2022 17:21   CT Maxillofacial Wo Contrast  Result Date: 01/04/2022 CLINICAL DATA:  Facial trauma EXAM: CT HEAD WITHOUT CONTRAST CT MAXILLOFACIAL WITHOUT CONTRAST TECHNIQUE: Multidetector CT imaging of the head and  maxillofacial structures were performed using the standard protocol without intravenous contrast. Multiplanar CT image reconstructions of the maxillofacial structures were also generated. COMPARISON:  CT dated July ninth 2022 FINDINGS: CT HEAD FINDINGS Brain: Chronic white matter ischemic change. Old bilateral basal ganglia infarcts no evidence of acute infarction, hemorrhage, hydrocephalus, extra-axial collection or mass lesion/mass effect. Vascular: No hyperdense vessel or unexpected calcification. Skull: Normal. Negative for fracture or focal lesion. Other: None. CT MAXILLOFACIAL FINDINGS Osseous: No fracture or mandibular dislocation. No destructive process. Orbits: Negative. No traumatic or inflammatory finding. Sinuses: Clear. Soft tissues: Negative. IMPRESSION: 1. No acute intracranial abnormality. 2. No evidence of facial bone fracture. Electronically Signed   By: Yetta Glassman M.D.   On: 01/04/2022 17:21    PROCEDURES:  Critical Care performed: No  ..Laceration Repair  Date/Time: 01/04/2022 5:19 PM Performed by: Melvenia Needles, PA-C Authorized by: Melvenia Needles, PA-C   Consent:    Consent obtained:  Verbal   Consent given by:  Patient   Risks, benefits, and alternatives were discussed: yes     Risks discussed:  Poor cosmetic result and poor wound healing Universal protocol:    Imaging studies available: yes     Site/side marked: yes     Patient identity confirmed:  Verbally with patient Anesthesia:    Anesthesia method:  Nerve block and topical application   Topical anesthetic:  Benzocaine gel   Block location:  Infraorbital nerve   Block needle gauge:  27 G   Block anesthetic:  Lidocaine 1% w/o epi   Block technique:  Buccal approach   Block injection procedure:  Anatomic landmarks palpated, introduced needle and incremental injection   Block outcome:  Anesthesia achieved Laceration details:    Location:  Lip   Lip location:  Upper lip, full thickness    Vermilion border involved: yes     Height of lip laceration:  More than half vertical height   Length (cm):  2.5   Depth (mm):  5 Pre-procedure details:    Preparation:  Patient was prepped and draped in usual sterile fashion Exploration:    Limited defect created (wound extended): no     Wound exploration: entire depth of wound visualized     Contaminated: no   Treatment:    Area cleansed with:  Saline   Amount of cleaning:  Standard   Irrigation solution:  Sterile saline   Irrigation volume:  5   Irrigation method:  Tap   Debridement:  None   Undermining:  None   Scar revision: no     Layers/structures repaired:  Deep subcutaneous Deep subcutaneous:    Suture size:  6-0   Suture material:  Vicryl   Suture technique:  Simple interrupted   Number of sutures:  6 Skin repair:    Repair method:  Sutures   Suture size:  5-0   Suture material:  Nylon (6-0 Vicryl)   Suture technique:  Simple interrupted   Number of sutures: 5 nylon + 3 vicryl. Approximation:    Approximation:  Close   Vermilion  border well-aligned: yes   Repair type:    Repair type:  Complex Post-procedure details:    Dressing:  Open (no dressing)   Procedure completion:  Tolerated well, no immediate complications   MEDICATIONS ORDERED IN ED: Medications  traMADol (ULTRAM) tablet 50 mg (50 mg Oral Given 01/04/22 1603)  lidocaine (PF) (XYLOCAINE) 1 % injection 5 mL (5 mLs Infiltration Given by Other 01/04/22 1756)  Benzocaine (HURRCAINE) 20 % mouth spray ( Mouth/Throat Given 01/04/22 1756)  Tdap (BOOSTRIX) injection 0.5 mL (0.5 mLs Intramuscular Given 01/04/22 1604)     IMPRESSION / MDM / [place] / ED COURSE  I reviewed the triage vital signs and the nursing notes.                              Differential diagnosis includes, but is not limited to, [place] fracture, nasal fracture, SDH, lip/mouth laceration  Patient's diagnosis is consistent with chemical fall resulting in a large complex lip  laceration.  Patient and her adult sister agreed to suture repair.  Good wound edge approximation was achieved, and appropriate alignment at the vermilion border resulting in adequate cosmesis. Patient's course in the ED was stable.  No signs of an acute closed head injury. Reassuring head and facial CTs reviewed by me were negative for any acute intracranial process or facial fractures.  Patient's tetanus was updated.  Patient is to follow up with her primary provider for suture removal in 7 to 10 days. Patient will be discharged home with prescriptions for Augmentin as she did sustain a full-thickness wound with substantial buccal mucosa involvement. Patient is given ED precautions to return to the ED for any worsening or new symptoms.  Patient is discharged to the care of her sister who is her primary caregiver at this time, in no acute distress.  All questions were encouraged and answered.   FINAL CLINICAL IMPRESSION(S) / ED DIAGNOSES   Final diagnoses:  Fall in home, initial encounter  Complicated laceration of lip, initial encounter     Rx / DC Orders   ED Discharge Orders          Ordered    amoxicillin-clavulanate (AUGMENTIN) 875-125 MG tablet  2 times daily        01/04/22 1758             Note:  This document was prepared using Dragon voice recognition software and may include unintentional dictation errors.    Melvenia Needles, PA-C 01/04/22 1814    Vladimir Crofts, MD 01/04/22 [phone]

## 2022-01-04 NOTE — ED Provider Triage Note (Signed)
Emergency Medicine Provider Triage Evaluation Note  Jill Smith , a 71 y.o. female  was evaluated in triage.  Pt complains of with injury due to fall.States she tripped over a rug and hit her face.    Review of Systems  Positive: Laceration to lip, Negative: Negative dental injury, no LOC  Physical Exam  BP 126/71    Pulse 69    Temp 98.3 F (36.8 C) (Oral)    Resp 14    Ht 5\' 9"  (1.753 m)    Wt 136.1 kg    SpO2 98%    BMI 44.30 kg/m  Gen:   Awake, no distress  able to answer questions with short answers Resp:  Normal effort No distress. Clear bilaterally MSK:   Moves extremities without difficulty, No injury Other:             Gaping laceration to right lip without active bleeding  Medical Decision Making  Medically screening exam initiated at 11:38 AM.  Appropriate orders placed.  AILAH BARNA was informed that the remainder of the evaluation will be completed by another provider, this initial triage assessment does not replace that evaluation, and the importance of remaining in the ED until their evaluation is complete.    Johnn Hai, PA-C 01/04/22 1152

## 2022-01-04 NOTE — ED Triage Notes (Signed)
Fell-tripped and fell on face today.  Has laceration to right upper lip. No loc.

## 2022-03-28 ENCOUNTER — Other Ambulatory Visit: Payer: Self-pay | Admitting: Family Medicine

## 2022-03-28 DIAGNOSIS — Z1231 Encounter for screening mammogram for malignant neoplasm of breast: Secondary | ICD-10-CM

## 2022-05-04 ENCOUNTER — Ambulatory Visit
Admission: RE | Admit: 2022-05-04 | Discharge: 2022-05-04 | Disposition: A | Discharge status: Home / Self Care | Payer: Medicare HMO | Source: Ambulatory Visit | Attending: Family Medicine | Admitting: Family Medicine

## 2022-05-04 DIAGNOSIS — Z1231 Encounter for screening mammogram for malignant neoplasm of breast: Secondary | ICD-10-CM | POA: Insufficient documentation

## 2022-12-17 ENCOUNTER — Emergency Department: Payer: Medicare HMO

## 2022-12-17 ENCOUNTER — Inpatient Hospital Stay: Payer: Medicare HMO

## 2022-12-17 ENCOUNTER — Inpatient Hospital Stay
Admission: EM | Admit: 2022-12-17 | Discharge: 2022-12-29 | DRG: 871 | Disposition: A | Discharge status: Home Health | Payer: Medicare HMO | Attending: Internal Medicine | Admitting: Internal Medicine

## 2022-12-17 ENCOUNTER — Other Ambulatory Visit: Payer: Self-pay

## 2022-12-17 DIAGNOSIS — I468 Cardiac arrest due to other underlying condition: Secondary | ICD-10-CM | POA: Diagnosis not present

## 2022-12-17 DIAGNOSIS — Z8249 Family history of ischemic heart disease and other diseases of the circulatory system: Secondary | ICD-10-CM

## 2022-12-17 DIAGNOSIS — M79A3 Nontraumatic compartment syndrome of abdomen: Secondary | ICD-10-CM | POA: Diagnosis present

## 2022-12-17 DIAGNOSIS — I5032 Chronic diastolic (congestive) heart failure: Secondary | ICD-10-CM | POA: Diagnosis present

## 2022-12-17 DIAGNOSIS — I441 Atrioventricular block, second degree: Secondary | ICD-10-CM | POA: Insufficient documentation

## 2022-12-17 DIAGNOSIS — J9601 Acute respiratory failure with hypoxia: Secondary | ICD-10-CM | POA: Diagnosis present

## 2022-12-17 DIAGNOSIS — K8000 Calculus of gallbladder with acute cholecystitis without obstruction: Secondary | ICD-10-CM

## 2022-12-17 DIAGNOSIS — T68XXXA Hypothermia, initial encounter: Secondary | ICD-10-CM | POA: Diagnosis not present

## 2022-12-17 DIAGNOSIS — K859 Acute pancreatitis without necrosis or infection, unspecified: Secondary | ICD-10-CM | POA: Diagnosis not present

## 2022-12-17 DIAGNOSIS — G9389 Other specified disorders of brain: Secondary | ICD-10-CM | POA: Diagnosis present

## 2022-12-17 DIAGNOSIS — Z8042 Family history of malignant neoplasm of prostate: Secondary | ICD-10-CM

## 2022-12-17 DIAGNOSIS — Z833 Family history of diabetes mellitus: Secondary | ICD-10-CM

## 2022-12-17 DIAGNOSIS — A419 Sepsis, unspecified organism: Secondary | ICD-10-CM | POA: Diagnosis not present

## 2022-12-17 DIAGNOSIS — G928 Other toxic encephalopathy: Secondary | ICD-10-CM | POA: Diagnosis present

## 2022-12-17 DIAGNOSIS — R578 Other shock: Secondary | ICD-10-CM | POA: Diagnosis present

## 2022-12-17 DIAGNOSIS — K8511 Biliary acute pancreatitis with uninfected necrosis: Secondary | ICD-10-CM | POA: Diagnosis not present

## 2022-12-17 DIAGNOSIS — E872 Acidosis, unspecified: Secondary | ICD-10-CM | POA: Diagnosis present

## 2022-12-17 DIAGNOSIS — E870 Hyperosmolality and hypernatremia: Secondary | ICD-10-CM | POA: Diagnosis not present

## 2022-12-17 DIAGNOSIS — I110 Hypertensive heart disease with heart failure: Secondary | ICD-10-CM | POA: Diagnosis present

## 2022-12-17 DIAGNOSIS — K8689 Other specified diseases of pancreas: Secondary | ICD-10-CM | POA: Diagnosis present

## 2022-12-17 DIAGNOSIS — N179 Acute kidney failure, unspecified: Secondary | ICD-10-CM

## 2022-12-17 DIAGNOSIS — Z7189 Other specified counseling: Secondary | ICD-10-CM | POA: Diagnosis not present

## 2022-12-17 DIAGNOSIS — Z87891 Personal history of nicotine dependence: Secondary | ICD-10-CM

## 2022-12-17 DIAGNOSIS — N170 Acute kidney failure with tubular necrosis: Secondary | ICD-10-CM | POA: Diagnosis present

## 2022-12-17 DIAGNOSIS — I4729 Other ventricular tachycardia: Secondary | ICD-10-CM

## 2022-12-17 DIAGNOSIS — Z1152 Encounter for screening for COVID-19: Secondary | ICD-10-CM | POA: Diagnosis not present

## 2022-12-17 DIAGNOSIS — Z515 Encounter for palliative care: Secondary | ICD-10-CM | POA: Diagnosis not present

## 2022-12-17 DIAGNOSIS — R188 Other ascites: Secondary | ICD-10-CM | POA: Diagnosis present

## 2022-12-17 DIAGNOSIS — J690 Pneumonitis due to inhalation of food and vomit: Secondary | ICD-10-CM | POA: Diagnosis present

## 2022-12-17 DIAGNOSIS — E669 Obesity, unspecified: Secondary | ICD-10-CM

## 2022-12-17 DIAGNOSIS — L8990 Pressure ulcer of unspecified site, unspecified stage: Secondary | ICD-10-CM | POA: Insufficient documentation

## 2022-12-17 DIAGNOSIS — L97521 Non-pressure chronic ulcer of other part of left foot limited to breakdown of skin: Secondary | ICD-10-CM

## 2022-12-17 DIAGNOSIS — R6521 Severe sepsis with septic shock: Secondary | ICD-10-CM | POA: Diagnosis present

## 2022-12-17 DIAGNOSIS — R531 Weakness: Secondary | ICD-10-CM

## 2022-12-17 DIAGNOSIS — L97529 Non-pressure chronic ulcer of other part of left foot with unspecified severity: Secondary | ICD-10-CM | POA: Diagnosis present

## 2022-12-17 DIAGNOSIS — F209 Schizophrenia, unspecified: Secondary | ICD-10-CM | POA: Diagnosis present

## 2022-12-17 DIAGNOSIS — Z7982 Long term (current) use of aspirin: Secondary | ICD-10-CM

## 2022-12-17 DIAGNOSIS — I469 Cardiac arrest, cause unspecified: Secondary | ICD-10-CM | POA: Diagnosis not present

## 2022-12-17 DIAGNOSIS — D6959 Other secondary thrombocytopenia: Secondary | ICD-10-CM | POA: Diagnosis present

## 2022-12-17 DIAGNOSIS — K851 Biliary acute pancreatitis without necrosis or infection: Secondary | ICD-10-CM

## 2022-12-17 DIAGNOSIS — Z635 Disruption of family by separation and divorce: Secondary | ICD-10-CM

## 2022-12-17 DIAGNOSIS — Z6841 Body Mass Index (BMI) 40.0 and over, adult: Secondary | ICD-10-CM | POA: Diagnosis not present

## 2022-12-17 DIAGNOSIS — J189 Pneumonia, unspecified organism: Secondary | ICD-10-CM | POA: Diagnosis present

## 2022-12-17 DIAGNOSIS — R040 Epistaxis: Secondary | ICD-10-CM | POA: Diagnosis not present

## 2022-12-17 DIAGNOSIS — R6510 Systemic inflammatory response syndrome (SIRS) of non-infectious origin without acute organ dysfunction: Secondary | ICD-10-CM

## 2022-12-17 DIAGNOSIS — R4182 Altered mental status, unspecified: Secondary | ICD-10-CM | POA: Diagnosis present

## 2022-12-17 DIAGNOSIS — E278 Other specified disorders of adrenal gland: Secondary | ICD-10-CM | POA: Diagnosis present

## 2022-12-17 DIAGNOSIS — K7200 Acute and subacute hepatic failure without coma: Secondary | ICD-10-CM | POA: Diagnosis present

## 2022-12-17 DIAGNOSIS — I472 Ventricular tachycardia, unspecified: Secondary | ICD-10-CM | POA: Diagnosis not present

## 2022-12-17 DIAGNOSIS — T68XXXS Hypothermia, sequela: Secondary | ICD-10-CM | POA: Diagnosis not present

## 2022-12-17 DIAGNOSIS — Z79899 Other long term (current) drug therapy: Secondary | ICD-10-CM

## 2022-12-17 DIAGNOSIS — E875 Hyperkalemia: Secondary | ICD-10-CM | POA: Diagnosis present

## 2022-12-17 DIAGNOSIS — E162 Hypoglycemia, unspecified: Secondary | ICD-10-CM | POA: Diagnosis present

## 2022-12-17 LAB — COMPREHENSIVE METABOLIC PANEL
ALT: 129 U/L — ABNORMAL HIGH (ref 0–44)
AST: 73 U/L — ABNORMAL HIGH (ref 15–41)
Albumin: 3.5 g/dL (ref 3.5–5.0)
Alkaline Phosphatase: 78 U/L (ref 38–126)
Anion gap: 6 (ref 5–15)
BUN: 27 mg/dL — ABNORMAL HIGH (ref 8–23)
CO2: 28 mmol/L (ref 22–32)
Calcium: 10.4 mg/dL — ABNORMAL HIGH (ref 8.9–10.3)
Chloride: 107 mmol/L (ref 98–111)
Creatinine, Ser: 0.9 mg/dL (ref 0.44–1.00)
GFR, Estimated: >60 mL/min (ref >60)
Glucose, Bld: 102 mg/dL — ABNORMAL HIGH (ref 70–99)
Potassium: 5.2 mmol/L — ABNORMAL HIGH (ref 3.5–5.1)
Sodium: 141 mmol/L (ref 135–145)
Total Bilirubin: 0.8 mg/dL (ref 0.3–1.2)
Total Protein: 6.3 g/dL — ABNORMAL LOW (ref 6.5–8.1)

## 2022-12-17 LAB — CBC WITH DIFFERENTIAL/PLATELET
Abs Immature Granulocytes: 0.01 10*3/uL (ref 0.00–0.07)
Basophils Absolute: 0 10*3/uL (ref 0.0–0.1)
Basophils Relative: 0 %
Eosinophils Absolute: 0 10*3/uL (ref 0.0–0.5)
Eosinophils Relative: 0 %
HCT: 46.4 % — ABNORMAL HIGH (ref 36.0–46.0)
Hemoglobin: 14.8 g/dL (ref 12.0–15.0)
Immature Granulocytes: 0 %
Lymphocytes Relative: 30 %
Lymphs Abs: 0.9 10*3/uL (ref 0.7–4.0)
MCH: 28.1 pg (ref 26.0–34.0)
MCHC: 31.9 g/dL (ref 30.0–36.0)
MCV: 88.2 fL (ref 80.0–100.0)
Monocytes Absolute: 0.2 10*3/uL (ref 0.1–1.0)
Monocytes Relative: 5 %
Neutro Abs: 2.1 10*3/uL (ref 1.7–7.7)
Neutrophils Relative %: 65 %
Platelets: 92 10*3/uL — ABNORMAL LOW (ref 150–400)
RBC: 5.26 MIL/uL — ABNORMAL HIGH (ref 3.87–5.11)
RDW: 13.9 % (ref 11.5–15.5)
WBC: 3.2 10*3/uL — ABNORMAL LOW (ref 4.0–10.5)
nRBC: 0.6 % — ABNORMAL HIGH (ref 0.0–0.2)

## 2022-12-17 LAB — URINE DRUG SCREEN, QUALITATIVE (ARMC ONLY)
Amphetamines, Ur Screen: NOT DETECTED
Barbiturates, Ur Screen: NOT DETECTED
Benzodiazepine, Ur Scrn: NOT DETECTED
Cannabinoid 50 Ng, Ur ~~LOC~~: NOT DETECTED
Cocaine Metabolite,Ur ~~LOC~~: NOT DETECTED
MDMA (Ecstasy)Ur Screen: NOT DETECTED
Methadone Scn, Ur: NOT DETECTED
Opiate, Ur Screen: NOT DETECTED
Phencyclidine (PCP) Ur S: NOT DETECTED
Tricyclic, Ur Screen: NOT DETECTED

## 2022-12-17 LAB — URINALYSIS, COMPLETE (UACMP) WITH MICROSCOPIC
Bilirubin Urine: NEGATIVE
Glucose, UA: NEGATIVE mg/dL
Hgb urine dipstick: NEGATIVE
Ketones, ur: NEGATIVE mg/dL
Leukocytes,Ua: NEGATIVE
Nitrite: NEGATIVE
Protein, ur: NEGATIVE mg/dL
Specific Gravity, Urine: 1.011 (ref 1.005–1.030)
pH: 6 (ref 5.0–8.0)

## 2022-12-17 LAB — LACTIC ACID, PLASMA
Lactic Acid, Venous: 1.6 mmol/L (ref 0.5–1.9)
Lactic Acid, Venous: 1.3 mmol/L (ref 0.5–1.9)

## 2022-12-17 LAB — BLOOD GAS, ARTERIAL
Acid-Base Excess: 2.4 mmol/L — ABNORMAL HIGH (ref 0.0–2.0)
Bicarbonate: 30.2 mmol/L — ABNORMAL HIGH (ref 20.0–28.0)
FIO2: 50 %
MECHVT: 450 mL
Mechanical Rate: 15
O2 Saturation: 90.2 %
PEEP: 5 cmH2O
Patient temperature: 37
pCO2 arterial: 60 mmHg — ABNORMAL HIGH (ref 32–48)
pH, Arterial: 7.31 — ABNORMAL LOW (ref 7.35–7.45)
pO2, Arterial: 64 mmHg — ABNORMAL LOW (ref 83–108)

## 2022-12-17 LAB — CK: Total CK: 69 U/L (ref 38–234)

## 2022-12-17 LAB — TROPONIN I (HIGH SENSITIVITY): Troponin I (High Sensitivity): 5 ng/L (ref <18)

## 2022-12-17 LAB — GLUCOSE, CAPILLARY
Glucose-Capillary: 107 mg/dL — ABNORMAL HIGH (ref 70–99)
Glucose-Capillary: 55 mg/dL — ABNORMAL LOW (ref 70–99)
Glucose-Capillary: 177 mg/dL — ABNORMAL HIGH (ref 70–99)

## 2022-12-17 LAB — APTT: aPTT: 34 seconds (ref 24–36)

## 2022-12-17 LAB — MRSA NEXT GEN BY PCR, NASAL: MRSA by PCR Next Gen: NOT DETECTED

## 2022-12-17 LAB — RESP PANEL BY RT-PCR (FLU A&B, COVID) ARPGX2
Influenza A by PCR: NEGATIVE
Influenza B by PCR: NEGATIVE
SARS Coronavirus 2 by RT PCR: NEGATIVE

## 2022-12-17 LAB — PROCALCITONIN: Procalcitonin: <0.1 ng/mL

## 2022-12-17 LAB — PROTIME-INR
INR: 1.1 (ref 0.8–1.2)
Prothrombin Time: 14.1 seconds (ref 11.4–15.2)

## 2022-12-17 LAB — TSH: TSH: 2.884 u[IU]/mL (ref 0.350–4.500)

## 2022-12-17 MED ORDER — HYDROCORTISONE SOD SUC (PF) 100 MG IJ SOLR: 100.0000 mg | INTRAMUSCULAR | Status: AC

## 2022-12-17 MED ORDER — SODIUM CHLORIDE 0.9 % IV SOLN
250.0000 mL | INTRAVENOUS | Status: DC
  Administered 2022-12-19 – 2022-12-21 (×2): 250 mL via INTRAVENOUS

## 2022-12-17 MED ORDER — POLYETHYLENE GLYCOL 3350 17 G PO PACK: 17.0000 g | PACK | Freq: Every day | ORAL | Status: DC | PRN

## 2022-12-17 MED ORDER — SODIUM BICARBONATE 8.4 % IV SOLN
INTRAVENOUS | Status: AC
  Filled 2022-12-17: qty 100

## 2022-12-17 MED ORDER — SODIUM CHLORIDE 0.9 % IV SOLN: INTRAVENOUS | Status: DC

## 2022-12-17 MED ORDER — LACTATED RINGERS IV BOLUS
1000.0000 mL | Freq: Once | INTRAVENOUS | Status: AC
  Administered 2022-12-17: 1000 mL via INTRAVENOUS

## 2022-12-17 MED ORDER — SODIUM CHLORIDE 0.9 % IV BOLUS
1000.0000 mL | Freq: Once | INTRAVENOUS | Status: AC
  Administered 2022-12-17: 1000 mL via INTRAVENOUS

## 2022-12-17 MED ORDER — SODIUM BICARBONATE 8.4 % IV SOLN
INTRAVENOUS | Status: DC
  Filled 2022-12-17: qty 150
  Filled 2022-12-17: qty 1000

## 2022-12-17 MED ORDER — DEXTROSE 50 % IV SOLN: 12.5000 g | INTRAVENOUS | Status: AC

## 2022-12-17 MED ORDER — NOREPINEPHRINE 4 MG/250ML-% IV SOLN
2.0000 ug/min | INTRAVENOUS | Status: DC
  Administered 2022-12-17: 2 ug/min via INTRAVENOUS
  Administered 2022-12-18: 4 ug/min via INTRAVENOUS
  Filled 2022-12-17: qty 250

## 2022-12-17 MED ORDER — PIPERACILLIN-TAZOBACTAM 3.375 G IVPB
3.3750 g | Freq: Three times a day (TID) | INTRAVENOUS | Status: DC
  Administered 2022-12-17 – 2022-12-20 (×9): 3.375 g via INTRAVENOUS
  Filled 2022-12-17 (×9): qty 50

## 2022-12-17 MED ORDER — CHLORHEXIDINE GLUCONATE CLOTH 2 % EX PADS
6.0000 | MEDICATED_PAD | Freq: Every day | CUTANEOUS | Status: DC
  Administered 2022-12-17 – 2022-12-19 (×2): 6 via TOPICAL

## 2022-12-17 MED ORDER — METHYLPREDNISOLONE SODIUM SUCC 125 MG IJ SOLR
INTRAMUSCULAR | Status: AC
  Filled 2022-12-17: qty 2

## 2022-12-17 MED ORDER — PROPOFOL 1000 MG/100ML IV EMUL
INTRAVENOUS | Status: AC
  Filled 2022-12-17: qty 100

## 2022-12-17 MED ORDER — DOCUSATE SODIUM 100 MG PO CAPS: 100.0000 mg | ORAL_CAPSULE | Freq: Two times a day (BID) | ORAL | Status: DC | PRN

## 2022-12-17 MED ORDER — PROPOFOL 1000 MG/100ML IV EMUL
5.0000 ug/kg/min | INTRAVENOUS | Status: DC
  Administered 2022-12-18: 10 ug/kg/min via INTRAVENOUS

## 2022-12-17 MED ORDER — SODIUM CHLORIDE 0.9 % IV BOLUS: 1000.0000 mL | Freq: Once | INTRAVENOUS | Status: DC

## 2022-12-17 MED ORDER — HYDROCORTISONE SOD SUC (PF) 100 MG IJ SOLR
INTRAMUSCULAR | Status: AC
  Administered 2022-12-17: 100 mg
  Filled 2022-12-17: qty 2

## 2022-12-17 MED ORDER — NYSTATIN 100000 UNIT/GM EX POWD
Freq: Three times a day (TID) | CUTANEOUS | Status: DC
  Administered 2022-12-22 – 2022-12-23 (×2): 1 via TOPICAL
  Filled 2022-12-17 (×4): qty 15

## 2022-12-17 MED ORDER — DEXTROSE 50 % IV SOLN
INTRAVENOUS | Status: AC
  Administered 2022-12-17: 12.5 g via INTRAVENOUS
  Filled 2022-12-17: qty 50

## 2022-12-17 NOTE — Progress Notes (Signed)
Hypoglycemic Event  CBG: 55  Treatment: D50 25 mL (12.5 gm)  Symptoms: Shaky  Follow-up CBG: Time:08:28 CBG Result:107   Possible Reasons for Event: Inadequate meal intake  Comments/MD notified:Elizabeth Ouma NP    Laurance Flatten, [place]

## 2022-12-17 NOTE — Consult Note (Signed)
Pharmacy Antibiotic Note  Jill Smith is a 71 y.o. female admitted on 12/17/2022 with AMS and hypothermia Pharmacy has been consulted for Zosyn dosing.  Plan: Zosyn 3.375g IV q8h (4 hour infusion).     Temp (24hrs), Avg:81.9 F (27.7 C), Min:81.7 F (27.6 C), Max:82.2 F (27.9 C)  Recent Labs  Lab 12/17/22 1401  WBC 3.2*  CREATININE 0.90  LATICACIDVEN 1.6    CrCl cannot be calculated (Unknown ideal weight.).    No Known Allergies  Antimicrobials this admission: 12/18 Zosyn >>    Dose adjustments this admission:   Microbiology results: 12/18 BCx: sent 12/18 UCx: sent    Thank you for allowing pharmacy to be a part of this patient's care.  Dorothe Pea, PharmD, BCPS Clinical Pharmacist   12/17/2022 3:54 PM

## 2022-12-17 NOTE — Consult Note (Signed)
[facility] SURGICAL CONSULTATION NOTE (initial) - cpt: 99254   HISTORY OF PRESENT ILLNESS (HPI):  71 y.o. female presented to [facility] ED today for evaluation of a new state of nonverbal responsiveness, associated with hypothermia. Patient family reports apparent heater dysfunction last night, finding this 71 year old female altered.  On presentation to the ED here temperature was 81.8 Fahrenheit.  CT abdomen and pelvis obtained which showed the findings as reported below.  Surgery is consulted by ICU team representative Donell Beers in this context for evaluation and management of possible acute calculus cholecystitis, associated with pancreatitis with necrosis/phlegmon formation.  At the time of my examination there is no family members present, the patient was nonverbal staring and blinking.  She is currently has Retail banker warmer in place.  With additional warming measures.  PAST MEDICAL HISTORY (PMH):  Past Medical History:  Diagnosis Date   Bipolar disorder ([place])    Depression    Hypertension    Obesity    Schizophrenia ([place])    Shortness of breath dyspnea    Tubular adenoma of colon      PAST SURGICAL HISTORY ([place]):  Past Surgical History:  Procedure Laterality Date   COLONOSCOPY WITH PROPOFOL N/A 01/17/2016   Procedure: COLONOSCOPY WITH PROPOFOL;  Surgeon: Lollie Sails, MD;  Location: [facility] ENDOSCOPY;  Service: Endoscopy;  Laterality: N/A;   COLONOSCOPY WITH PROPOFOL N/A 02/28/2016   Procedure: COLONOSCOPY WITH PROPOFOL;  Surgeon: Lollie Sails, MD;  Location: [facility] ENDOSCOPY;  Service: Endoscopy;  Laterality: N/A;   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 Authorizing Provider  amoxicillin-clavulanate (AUGMENTIN) 875-125 MG tablet Take 1 tablet by mouth 2 (two) times daily. 12/11/22 12/31/22 Yes [provider]  ciprofloxacin (CIPRO) 500  MG tablet Take 500 mg by mouth 2 (two) times daily. 12/11/22 12/31/22 Yes [provider]  metoprolol tartrate (LOPRESSOR) 25 MG tablet Take 25 mg by mouth 2 (two) times daily. 12/06/22  Yes [provider]  [employer] powder Apply 1 Application topically 2 (two) times daily. 12/05/22  Yes [provider]  amLODipine (NORVASC) 5 MG tablet Take 5 mg by mouth daily.    [provider]  aspirin EC 81 MG tablet Take 81 mg by mouth daily.    [provider]  docusate sodium (COLACE) 100 MG capsule Take 100 mg by mouth daily as needed for mild constipation.     [provider]  isosorbide mononitrate (IMDUR) 60 MG 24 hr tablet Take 60 mg by mouth daily.    [provider]  lisinopril (PRINIVIL,ZESTRIL) 20 MG tablet Take 20 mg by mouth daily.    [provider]  metoprolol tartrate (LOPRESSOR) 50 MG tablet Take 50 mg by mouth 2 (two) times daily.     [provider]  paliperidone (INVEGA SUSTENNA) 234 MG/1.5ML SUSY injection Inject 234 mg into the muscle every 30 (thirty) days.    [provider]  QUEtiapine (SEROQUEL) 100 MG tablet Take 100 mg by mouth at bedtime. 06/15/21   [provider]  SIMBRINZA 1-0.2 % SUSP Place 1 drop into the left eye 3 (three) times daily. Patient not taking: Reported on 12/17/2022 02/20/21   [provider]  vitamin E 400 UNIT capsule Take 800 Units by mouth 2 (two) times daily.     [provider]     ALLERGIES:  No Known  Allergies   SOCIAL HISTORY:  Social History   Socioeconomic History   Marital status: Legally Separated    Spouse name: Not on file   Number of children: Not on file   Years of education: Not on file   Highest education level: Not on file  Occupational History   Not on file  Tobacco Use   Smoking status: Former    Types: Cigarettes   Smokeless tobacco: Never  Substance and Sexual Activity   Alcohol use: No   Drug use: No   Sexual  activity: Not Currently  Other Topics Concern   Not on file  Social History Narrative   Lives at home with her sister.  Walks with a walker at baseline   Social Determinants of Radio broadcast assistant Strain: Not on file  Food Insecurity: Not on file  Transportation Needs: Not on file  Physical Activity: Not on file  Stress: Not on file  Social Connections: Not on file  Intimate Partner Violence: Not on file     FAMILY HISTORY:  Family History  Problem Relation Age of Onset   Diabetes Mother    Hypertension Mother    Diabetes Father    Prostate cancer Father    Breast cancer Neg Hx       REVIEW OF SYSTEMS:  Review of Systems  Unable to perform ROS: Patient nonverbal    VITAL SIGNS:  Temp:  [81.7 F (27.6 C)-82.2 F (27.9 C)] 82.2 F (27.9 C) (12/18 1529) Pulse Rate:  [35-73] 38 (12/18 1529) Resp:  [10-17] 16 (12/18 1529) BP: (99-151)/(82-120) 139/82 (12/18 1520) SpO2:  [87 %-100 %] 100 % (12/18 1529)             INTAKE/OUTPUT:  No intake/output data recorded.  PHYSICAL EXAM:  Physical Exam Blood pressure 139/82, pulse (!) 38, temperature (!) 82.2 F (27.9 C), resp. rate 16, SpO2 100 %.   CONSTITUTIONAL: Female with eyes open, minimal responsiveness, seemingly aware without distress.   EYES: Sclera non-icteric.   NECK: Trachea is midline. LYMPH NODES:  Lymph nodes in the neck are not enlarged. RESPIRATORY:  Normal respiratory effort without pathologic use of accessory muscles. CARDIOVASCULAR: Bradycardic. GI: The abdomen is obese, soft, nontender, and nondistended. There were normal bowel sounds. MUSCULOSKELETAL: Left great toe ulcer, otherwise no gross bony abnormality. SKIN: Skin turgor is normal, cool. NEUROLOGIC: Seems visually alert, nonverbal.  Does not follow commands.  Data Reviewed I have personally reviewed what is currently available of the patient's imaging, recent labs and medical records.    Labs:     Latest Ref Rng & Units  12/17/2022    2:01 PM 07/08/2021   11:05 AM 02/10/2019    5:36 AM  CBC  WBC 4.0 - 10.5 K/uL 3.2  5.4  6.2   Hemoglobin 12.0 - 15.0 g/dL 14.8  14.3  13.5   Hematocrit 36.0 - 46.0 % 46.4  44.2  42.5   Platelets 150 - 400 K/uL 92  192  219       Latest Ref Rng & Units 12/17/2022    2:01 PM 07/08/2021   11:05 AM 02/10/2019    5:36 AM  CMP  Glucose 70 - 99 mg/dL 102  107  114   BUN 8 - 23 mg/dL '27  14  21   '$ Creatinine 0.44 - 1.00 mg/dL 0.90  1.03  1.12   Sodium 135 - 145 mmol/L 141  138  135   Potassium 3.5 - 5.1 mmol/L  5.2  4.4  4.0   Chloride 98 - 111 mmol/L 107  105  102   CO2 22 - 32 mmol/L '28  25  27   '$ Calcium 8.9 - 10.3 mg/dL 10.4  10.2  10.0   Total Protein 6.5 - 8.1 g/dL 6.3  6.8    Total Bilirubin 0.3 - 1.2 mg/dL 0.8  1.1    Alkaline Phos 38 - 126 U/L 78  55    AST 15 - 41 U/L 73  20    ALT 0 - 44 U/L 129  15     Lipase 380  Imaging studies:   Last 24 hrs: CT Head Wo Contrast  Result Date: 12/17/2022 CLINICAL DATA:  Altered mental status EXAM: CT HEAD WITHOUT CONTRAST CT CERVICAL SPINE WITHOUT CONTRAST TECHNIQUE: Multidetector CT imaging of the head and cervical spine was performed following the standard protocol without intravenous contrast. Multiplanar CT image reconstructions of the cervical spine were also generated. RADIATION DOSE REDUCTION: This exam was performed according to the departmental dose-optimization program which includes automated exposure control, adjustment of the mA and/or kV according to patient size and/or use of iterative reconstruction technique. COMPARISON:  CT brain 01/04/2022, CT cervical spine 07/01/2010 FINDINGS: CT HEAD FINDINGS Brain: No acute territorial infarction, hemorrhage, or intracranial mass. Mild atrophy. Patchy white matter hypodensity consistent with chronic small vessel ischemic change. Stable ventricle size. 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 Vascular: No hyperdense vessels. Vertebral and carotid vascular  calcification Skull: Normal. Negative for fracture or focal lesion. Sinuses/Orbits: No acute finding. Other: None CT CERVICAL SPINE FINDINGS Alignment: Mild reversal of cervical lordosis. No subluxation. Facet alignment within normal limits Skull base and vertebrae: No acute fracture. No primary bone lesion or focal pathologic process. Soft tissues and spinal canal: Thickened appearance of the prevertebral soft tissues but somewhat similar appearance compared with 2011. Disc levels: Multilevel degenerative change. Moderate disc space narrowing C3 through C7 with prominent anterior osteophytes C4 through C7. Facet degenerative changes at multiple levels. Upper chest: Heterogeneous airspace opacities at the apices with left effusion. Subcentimeter nodule in the right lobe of thyroid, no further imaging evaluation is recommended. Other: None IMPRESSION: 1. No CT evidence for acute intracranial abnormality. Atrophy and chronic small vessel ischemic changes of the white matter. 2. Mild reversal of cervical lordosis with degenerative changes. No acute osseous abnormality. 3. Enlarged appearing prevertebral soft tissues without obvious focal fluid collection allowing for absence of contrast. Somewhat similar appearance on CT from 2011 suggesting that findings could be due to habitus and positioning. If there is further concern for soft tissue injury or infection, further assessment with MRI could be considered. 4. Heterogeneous airspace opacities at the apices/possible pneumonia with left effusion. Electronically Signed   By: Donavan Foil M.D.   On: 12/17/2022 15:37   CT Cervical Spine Wo Contrast  Result Date: 12/17/2022 CLINICAL DATA:  Altered mental status EXAM: CT HEAD WITHOUT CONTRAST CT CERVICAL SPINE WITHOUT CONTRAST TECHNIQUE: Multidetector CT imaging of the head and cervical spine was performed following the standard protocol without intravenous contrast. Multiplanar CT image reconstructions of the cervical  spine were also generated. RADIATION DOSE REDUCTION: This exam was performed according to the departmental dose-optimization program which includes automated exposure control, adjustment of the mA and/or kV according to patient size and/or use of iterative reconstruction technique. COMPARISON:  CT brain 01/04/2022, CT cervical spine 07/01/2010 FINDINGS: CT HEAD FINDINGS Brain: No acute territorial infarction, hemorrhage, or intracranial mass. Mild atrophy.  Patchy white matter hypodensity consistent with chronic small vessel ischemic change. Stable ventricle size. 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 Vascular: No hyperdense vessels. Vertebral and carotid vascular calcification Skull: Normal. Negative for fracture or focal lesion. Sinuses/Orbits: No acute finding. Other: None CT CERVICAL SPINE FINDINGS Alignment: Mild reversal of cervical lordosis. No subluxation. Facet alignment within normal limits Skull base and vertebrae: No acute fracture. No primary bone lesion or focal pathologic process. Soft tissues and spinal canal: Thickened appearance of the prevertebral soft tissues but somewhat similar appearance compared with 2011. Disc levels: Multilevel degenerative change. Moderate disc space narrowing C3 through C7 with prominent anterior osteophytes C4 through C7. Facet degenerative changes at multiple levels. Upper chest: Heterogeneous airspace opacities at the apices with left effusion. Subcentimeter nodule in the right lobe of thyroid, no further imaging evaluation is recommended. Other: None IMPRESSION: 1. No CT evidence for acute intracranial abnormality. Atrophy and chronic small vessel ischemic changes of the white matter. 2. Mild reversal of cervical lordosis with degenerative changes. No acute osseous abnormality. 3. Enlarged appearing prevertebral soft tissues without obvious focal fluid collection allowing for absence of contrast. Somewhat similar appearance on CT from 2011  suggesting that findings could be due to habitus and positioning. If there is further concern for soft tissue injury or infection, further assessment with MRI could be considered. 4. Heterogeneous airspace opacities at the apices/possible pneumonia with left effusion. Electronically Signed   By: Donavan Foil M.D.   On: 12/17/2022 15:37   CT ABDOMEN PELVIS WO CONTRAST  Result Date: 12/17/2022 CLINICAL DATA:  A 71 year old presents for evaluation of altered mental status. Reported hypothermia. EXAM: CT ABDOMEN AND PELVIS WITHOUT CONTRAST TECHNIQUE: Multidetector CT imaging of the abdomen and pelvis was performed following the standard protocol without IV contrast. RADIATION DOSE REDUCTION: This exam was performed according to the departmental dose-optimization program which includes automated exposure control, adjustment of the mA and/or kV according to patient size and/or use of iterative reconstruction technique. COMPARISON:  None available. FINDINGS: Lower chest: Basilar atelectasis. Patchy ground-glass and small bilateral pleural effusions greatest on the RIGHT. Heart size is enlarged. No gross chest wall abnormality. Irregular RIGHT lower lobe pulmonary nodule measuring 7 x 6 mm (image 19/4) Hepatobiliary: Ascites adjacent to the liver. Fissural widening of hepatic fissures. Cholelithiasis. No gross biliary duct dilation or focal hepatic lesion on noncontrast imaging. Pancreas: Peripancreatic stranding worse in the pancreatic-duodenal groove. No visible focal fluid collection though amorphous stranding tracks into the transverse mesocolon. Stranding is moderate to marked. Amorphous fluid density in the transverse mesocolon and or lesser sac (image 39/2) 6 x 2.4 cm measuring 20 for to 28 Hounsfield units Spleen: Unremarkable aside from trace perisplenic fluid. Adrenals/Urinary Tract: Stranding adjacent to the LEFT adrenal gland. Mild stranding adjacent to RIGHT adrenal gland. Adreniform thickening of  bilateral adrenal glands. Mild perinephric stranding. No hydronephrosis. No perivesical stranding. Stomach/Bowel: Small bowel without dilation or inflammation. Stranding in the central abdominal mesenteries and transverse mesocolon in the setting of presumed pancreatitis. Normal appendix. Rectal temperature probe in place. Mild thickening of the splenic flexure and transverse colon likely secondary to pancreatic inflammation Vascular/Lymphatic: Aortic atherosclerosis. No sign of aneurysm. Smooth contour of the IVC. There is no gastrohepatic or hepatoduodenal ligament lymphadenopathy. No retroperitoneal or mesenteric lymphadenopathy. No pelvic sidewall lymphadenopathy. Reproductive: Unremarkable by CT. Other: Large abdominal wall pannus. This is incompletely assessed but no gross stranding or skin thickening in imaged portions. Small volume ascites. No pelvic ascites currently.  Musculoskeletal: No acute bone finding. No destructive bone process. Spinal degenerative changes. IMPRESSION: 1. Findings of acute pancreatitis with early peripancreatic necrosis in the transverse mesocolon versus developing phlegmon, no organized collection best characterized as interstitial edematous pancreatitis at this time. Fluid also tracks into the anterior pararenal space. Changes are moderate to marked in terms of inflammation. 2. Cholelithiasis with signs of 8 mm common bile duct. Correlate with any laboratory evidence of biliary obstruction. Large gallstone seen in the neck of the gallbladder as well. MRCP may be helpful for further evaluation as warranted. 3. Small volume ascites and bilateral pleural effusions. 4. Adrenal hyperplasia. Could consider correlation with adrenal function if not yet performed. 5. Patchy ground-glass and small bilateral pleural effusions greatest on the RIGHT. Correlate with any potential for aspiration. Given asymmetry this is considered. 6. Irregular RIGHT lower lobe pulmonary nodule measuring 7 x 6  mm. Non-contrast chest CT at 6-12 months is recommended. If the nodule is stable at time of repeat CT, then future CT at 18-24 months (from today's scan) is considered optional for low-risk patients, but is recommended for high-risk patients. This recommendation follows the consensus statement: Guidelines for Management of Incidental Pulmonary Nodules Detected on CT Images: From the Fleischner Society 2017; Radiology 2017; 284:228-243. Aortic Atherosclerosis (ICD10-I70.0). Above findings related to potential pancreatitis were related to the provider is outlined below. These results were called by telephone at the time of interpretation on 12/17/2022 at 3:35 pm to provider MARK QUALE , who verbally acknowledged these results. Electronically Signed   By: Zetta Bills M.D.   On: 12/17/2022 15:36   DG Chest Port 1 View  Result Date: 12/17/2022 CLINICAL DATA:  Questionable sepsis, nonverbal, not baseline EXAM: PORTABLE CHEST 1 VIEW COMPARISON:  Portable exam 1422 hours compared to 02/09/2019 FINDINGS: External pacing leads project over chest. Rotated to the RIGHT. Normal heart size and mediastinal contours. Lungs grossly clear. No definite infiltrate, pleural effusion, or pneumothorax. IMPRESSION: No definite acute abnormalities. Electronically Signed   By: Lavonia Dana M.D.   On: 12/17/2022 14:43     Assessment/Plan:  71 y.o. female with possible gallstone pancreatitis, LFTs not consistent with common duct obstruction, large stone in neck of gallbladder, potentially with acute calculus cholecystitis complicated with:   Patient Active Problem List   Diagnosis Date Noted   Hypothermia 12/17/2022   Back pain 02/09/2019    -I concur with GI consultation, may defer MRCP evaluation until patient is euthermic.    - Appreciate CC initiation of Abx/DVT and PPI prophylaxis.    - Anticipating serial labs, and exam   - Will follow with you.    Thank you for the opportunity to participate in this patient's  care.   -- Ronny Bacon, M.D., FACS 12/17/2022, 4:27 PM

## 2022-12-17 NOTE — ED Triage Notes (Signed)
Pt brought in via ems for AMS. Pt is non-verbal on arrival (not baseline). Pt moves extremities on command. Ems states pts home heater has not been working. Pt found cold on ems arrival (unable to obtain temp PTA).

## 2022-12-17 NOTE — ED Provider Notes (Signed)
[facility] Provider Note    Event Date/Time   First MD Initiated Contact with Patient 12/17/22 1352     (approximate)   History   Altered Mental Status EM caveat  HPI  Jill Smith is a 71 y.o. female with a history of schizophrenia bipolar disorder and hypertension  Patient presents with altered mental status.  Patient is found to be nonverbal, and was found in her home where the heater was not working.  EMS was called as the patient was found and was found to be with altered mental status.  Concern for possible hypothermia.  She was found in her own home but the heater apparently was not working.  Patient was not found outside   Patient is able to verbalize short words and reports that she is not in any pain.  She does not recall what happened  Physical Exam   Triage Vital Signs: ED Triage Vitals [12/17/22 1350]  Enc Vitals Group     BP 99/83     Pulse Rate (!) 36     Resp 14     Temp (!) 81.8 F (27.7 C)     Temp Source Rectal     SpO2 (!) 87 %     Weight      Height      Head Circumference      Peak Flow      Pain Score      Pain Loc      Pain Edu?      Excl. in [place]?     Most recent vital signs: Vitals:   12/17/22 1520 12/17/22 1529  BP: 139/82   Pulse: (!) 36 (!) 38  Resp: 12 16  Temp: (!) 82 F (27.8 C) (!) 82.2 F (27.9 C)  SpO2: 100% 100%     General: Awake, no distress.  She does appear to be slow in her cognition, but follows basic commands such as wiggle fingers.  She is able to say hi, and answers yes no questions reporting no pain or discomfort. CV:  Good peripheral perfusion.  Her capillary refill is slightly slowed at about 1 second.  She is cool to touch in her peripherally and also in the cord.  She has bradycardia Resp:  Normal effort.  Clear bilaterally with exception to slight crackles noted in the right base Abd:  No distention.  Obese but no obvious tenderness Other:  No obvious gross deformities or rashes  or skin breakdown   ED Results / Procedures / Treatments   Labs (all labs ordered are listed, but only abnormal results are displayed) Labs Reviewed  COMPREHENSIVE METABOLIC PANEL - Abnormal; Notable for the following components:      Result Value   Potassium 5.2 (*)    Glucose, Bld 102 (*)    BUN 27 (*)    Calcium 10.4 (*)    Total Protein 6.3 (*)    AST 73 (*)    ALT 129 (*)    All other components within normal limits  CBC WITH DIFFERENTIAL/PLATELET - Abnormal; Notable for the following components:   WBC 3.2 (*)    RBC 5.26 (*)    HCT 46.4 (*)    Platelets 92 (*)    nRBC 0.6 (*)    All other components within normal limits  RESP PANEL BY RT-PCR (FLU A&B, COVID) ARPGX2  CULTURE, BLOOD (ROUTINE X 2)  CULTURE, BLOOD (ROUTINE X 2)  URINE CULTURE  LACTIC ACID, PLASMA  PROTIME-INR  APTT  CK  TSH  LACTIC ACID, PLASMA  URINALYSIS, COMPLETE (UACMP) WITH MICROSCOPIC  VITAMIN B1  THYROID PANEL WITH TSH  LIPASE, BLOOD  TROPONIN I (HIGH SENSITIVITY)     EKG  Interpreted by me at 1400 heart rate 35 QRS 120 QTc 470 Sinus bradycardia, abnormal ST segment noted in V3 and V4 with abnormal T wave appearance felt to be unlikely to represent STEMI or ACS (given clinical situation)   RADIOLOGY  CT Head Wo Contrast  Result Date: 12/17/2022 CLINICAL DATA:  Altered mental status EXAM: CT HEAD WITHOUT CONTRAST CT CERVICAL SPINE WITHOUT CONTRAST TECHNIQUE: Multidetector CT imaging of the head and cervical spine was performed following the standard protocol without intravenous contrast. Multiplanar CT image reconstructions of the cervical spine were also generated. RADIATION DOSE REDUCTION: This exam was performed according to the departmental dose-optimization program which includes automated exposure control, adjustment of the mA and/or kV according to patient size and/or use of iterative reconstruction technique. COMPARISON:  CT brain 01/04/2022, CT cervical spine 07/01/2010 FINDINGS:  CT HEAD FINDINGS Brain: No acute territorial infarction, hemorrhage, or intracranial mass. Mild atrophy. Patchy white matter hypodensity consistent with chronic small vessel ischemic change. Stable ventricle size. 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 Vascular: No hyperdense vessels. Vertebral and carotid vascular calcification Skull: Normal. Negative for fracture or focal lesion. Sinuses/Orbits: No acute finding. Other: None CT CERVICAL SPINE FINDINGS Alignment: Mild reversal of cervical lordosis. No subluxation. Facet alignment within normal limits Skull base and vertebrae: No acute fracture. No primary bone lesion or focal pathologic process. Soft tissues and spinal canal: Thickened appearance of the prevertebral soft tissues but somewhat similar appearance compared with 2011. Disc levels: Multilevel degenerative change. Moderate disc space narrowing C3 through C7 with prominent anterior osteophytes C4 through C7. Facet degenerative changes at multiple levels. Upper chest: Heterogeneous airspace opacities at the apices with left effusion. Subcentimeter nodule in the right lobe of thyroid, no further imaging evaluation is recommended. Other: None IMPRESSION: 1. No CT evidence for acute intracranial abnormality. Atrophy and chronic small vessel ischemic changes of the white matter. 2. Mild reversal of cervical lordosis with degenerative changes. No acute osseous abnormality. 3. Enlarged appearing prevertebral soft tissues without obvious focal fluid collection allowing for absence of contrast. Somewhat similar appearance on CT from 2011 suggesting that findings could be due to habitus and positioning. If there is further concern for soft tissue injury or infection, further assessment with MRI could be considered. 4. Heterogeneous airspace opacities at the apices/possible pneumonia with left effusion. Electronically Signed   By: Donavan Foil M.D.   On: 12/17/2022 15:37   CT Cervical  Spine Wo Contrast  Result Date: 12/17/2022 CLINICAL DATA:  Altered mental status EXAM: CT HEAD WITHOUT CONTRAST CT CERVICAL SPINE WITHOUT CONTRAST TECHNIQUE: Multidetector CT imaging of the head and cervical spine was performed following the standard protocol without intravenous contrast. Multiplanar CT image reconstructions of the cervical spine were also generated. RADIATION DOSE REDUCTION: This exam was performed according to the departmental dose-optimization program which includes automated exposure control, adjustment of the mA and/or kV according to patient size and/or use of iterative reconstruction technique. COMPARISON:  CT brain 01/04/2022, CT cervical spine 07/01/2010 FINDINGS: CT HEAD FINDINGS Brain: No acute territorial infarction, hemorrhage, or intracranial mass. Mild atrophy. Patchy white matter hypodensity consistent with chronic small vessel ischemic change. Stable ventricle size. 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 Vascular: No hyperdense vessels. Vertebral and  carotid vascular calcification Skull: Normal. Negative for fracture or focal lesion. Sinuses/Orbits: No acute finding. Other: None CT CERVICAL SPINE FINDINGS Alignment: Mild reversal of cervical lordosis. No subluxation. Facet alignment within normal limits Skull base and vertebrae: No acute fracture. No primary bone lesion or focal pathologic process. Soft tissues and spinal canal: Thickened appearance of the prevertebral soft tissues but somewhat similar appearance compared with 2011. Disc levels: Multilevel degenerative change. Moderate disc space narrowing C3 through C7 with prominent anterior osteophytes C4 through C7. Facet degenerative changes at multiple levels. Upper chest: Heterogeneous airspace opacities at the apices with left effusion. Subcentimeter nodule in the right lobe of thyroid, no further imaging evaluation is recommended. Other: None IMPRESSION: 1. No CT evidence for acute  intracranial abnormality. Atrophy and chronic small vessel ischemic changes of the white matter. 2. Mild reversal of cervical lordosis with degenerative changes. No acute osseous abnormality. 3. Enlarged appearing prevertebral soft tissues without obvious focal fluid collection allowing for absence of contrast. Somewhat similar appearance on CT from 2011 suggesting that findings could be due to habitus and positioning. If there is further concern for soft tissue injury or infection, further assessment with MRI could be considered. 4. Heterogeneous airspace opacities at the apices/possible pneumonia with left effusion. Electronically Signed   By: Donavan Foil M.D.   On: 12/17/2022 15:37   CT ABDOMEN PELVIS WO CONTRAST  Result Date: 12/17/2022 CLINICAL DATA:  A 71 year old presents for evaluation of altered mental status. Reported hypothermia. EXAM: CT ABDOMEN AND PELVIS WITHOUT CONTRAST TECHNIQUE: Multidetector CT imaging of the abdomen and pelvis was performed following the standard protocol without IV contrast. RADIATION DOSE REDUCTION: This exam was performed according to the departmental dose-optimization program which includes automated exposure control, adjustment of the mA and/or kV according to patient size and/or use of iterative reconstruction technique. COMPARISON:  None available. FINDINGS: Lower chest: Basilar atelectasis. Patchy ground-glass and small bilateral pleural effusions greatest on the RIGHT. Heart size is enlarged. No gross chest wall abnormality. Irregular RIGHT lower lobe pulmonary nodule measuring 7 x 6 mm (image 19/4) Hepatobiliary: Ascites adjacent to the liver. Fissural widening of hepatic fissures. Cholelithiasis. No gross biliary duct dilation or focal hepatic lesion on noncontrast imaging. Pancreas: Peripancreatic stranding worse in the pancreatic-duodenal groove. No visible focal fluid collection though amorphous stranding tracks into the transverse mesocolon. Stranding is  moderate to marked. Amorphous fluid density in the transverse mesocolon and or lesser sac (image 39/2) 6 x 2.4 cm measuring 20 for to 28 Hounsfield units Spleen: Unremarkable aside from trace perisplenic fluid. Adrenals/Urinary Tract: Stranding adjacent to the LEFT adrenal gland. Mild stranding adjacent to RIGHT adrenal gland. Adreniform thickening of bilateral adrenal glands. Mild perinephric stranding. No hydronephrosis. No perivesical stranding. Stomach/Bowel: Small bowel without dilation or inflammation. Stranding in the central abdominal mesenteries and transverse mesocolon in the setting of presumed pancreatitis. Normal appendix. Rectal temperature probe in place. Mild thickening of the splenic flexure and transverse colon likely secondary to pancreatic inflammation Vascular/Lymphatic: Aortic atherosclerosis. No sign of aneurysm. Smooth contour of the IVC. There is no gastrohepatic or hepatoduodenal ligament lymphadenopathy. No retroperitoneal or mesenteric lymphadenopathy. No pelvic sidewall lymphadenopathy. Reproductive: Unremarkable by CT. Other: Large abdominal wall pannus. This is incompletely assessed but no gross stranding or skin thickening in imaged portions. Small volume ascites. No pelvic ascites currently. Musculoskeletal: No acute bone finding. No destructive bone process. Spinal degenerative changes. IMPRESSION: 1. Findings of acute pancreatitis with early peripancreatic necrosis in the transverse mesocolon versus developing phlegmon, no  organized collection best characterized as interstitial edematous pancreatitis at this time. Fluid also tracks into the anterior pararenal space. Changes are moderate to marked in terms of inflammation. 2. Cholelithiasis with signs of 8 mm common bile duct. Correlate with any laboratory evidence of biliary obstruction. Large gallstone seen in the neck of the gallbladder as well. MRCP may be helpful for further evaluation as warranted. 3. Small volume ascites and  bilateral pleural effusions. 4. Adrenal hyperplasia. Could consider correlation with adrenal function if not yet performed. 5. Patchy ground-glass and small bilateral pleural effusions greatest on the RIGHT. Correlate with any potential for aspiration. Given asymmetry this is considered. 6. Irregular RIGHT lower lobe pulmonary nodule measuring 7 x 6 mm. Non-contrast chest CT at 6-12 months is recommended. If the nodule is stable at time of repeat CT, then future CT at 18-24 months (from today's scan) is considered optional for low-risk patients, but is recommended for high-risk patients. This recommendation follows the consensus statement: Guidelines for Management of Incidental Pulmonary Nodules Detected on CT Images: From the Fleischner Society 2017; Radiology 2017; 284:228-243. Aortic Atherosclerosis (ICD10-I70.0). Above findings related to potential pancreatitis were related to the provider is outlined below. These results were called by telephone at the time of interpretation on 12/17/2022 at 3:35 pm to provider Amiree No , who verbally acknowledged these results. Electronically Signed   By: Zetta Bills M.D.   On: 12/17/2022 15:36   DG Chest Port 1 View  Result Date: 12/17/2022 CLINICAL DATA:  Questionable sepsis, nonverbal, not baseline EXAM: PORTABLE CHEST 1 VIEW COMPARISON:  Portable exam 1422 hours compared to 02/09/2019 FINDINGS: External pacing leads project over chest. Rotated to the RIGHT. Normal heart size and mediastinal contours. Lungs grossly clear. No definite infiltrate, pleural effusion, or pneumothorax. IMPRESSION: No definite acute abnormalities. Electronically Signed   By: Lavonia Dana M.D.   On: 12/17/2022 14:43       PROCEDURES:  Critical Care performed: Yes, see critical care procedure note(s)  CRITICAL CARE Performed by: Delman Kitten   Total critical care time: 40 minutes  Critical care time was exclusive of separately billable procedures and treating other  patients.  Critical care was necessary to treat or prevent imminent or life-threatening deterioration.  Critical care was time spent personally by me on the following activities: development of treatment plan with patient and/or surrogate as well as nursing, discussions with consultants, evaluation of patient's response to treatment, examination of patient, obtaining history from patient or surrogate, ordering and performing treatments and interventions, ordering and review of laboratory studies, ordering and review of radiographic studies, pulse oximetry and re-evaluation of patient's condition.   Procedures   MEDICATIONS ORDERED IN ED: Medications  docusate sodium (COLACE) capsule 100 mg (has no administration in time range)  polyethylene glycol (MIRALAX / GLYCOLAX) packet 17 g (has no administration in time range)  sodium chloride 0.9 % bolus 1,000 mL (has no administration in time range)  piperacillin-tazobactam (ZOSYN) IVPB 3.375 g (has no administration in time range)  lactated ringers bolus 1,000 mL (0 mLs Intravenous Stopped 12/17/22 1520)  sodium chloride 0.9 % bolus 1,000 mL (1,000 mLs Intravenous New Bag/Given 12/17/22 1521)     IMPRESSION / MDM / ASSESSMENT AND PLAN / ED COURSE  I reviewed the triage vital signs and the nursing notes.                              Differential diagnosis includes, but  is not limited to, viral mental exposure, metabolic abnormality, toxic or metabolic cause, central neurologic cause, etc.  The patient has severe hypothermia and had environmental exposure in particular the heat stopped working in her house last night per her sister whom she lives with, but the sister does not appear to be suffering the effects of hypothermia.  Sister advises patient was found unresponsive in her bed and no noted history of fall or injury.  Based on this the differential diagnosis quite broad plan to obtain CT head cervical spine and given the patient's  unresponsiveness a CT of the abdomen pelvis as I do not find her abdominal exam to be reliable  Patient's presentation is most consistent with acute presentation with potential threat to life or bodily function.     The patient is on the cardiac monitor to evaluate for evidence of arrhythmia and/or significant heart rate changes.  ----------------------------------------- 3:50 PM on 12/17/2022 ----------------------------------------- Patient has been seen and evaluated admitted to the services of the ICU Dr. Dillard Cannon.  CT results returned after the patient had been admitted, but I discussed with our ICU team and I will order Zosyn for antibiotic coverage for concern of possible aspiration and potential intra-abdominal/acute pancreatitis of an unknown nature.  Her transaminases do not highly suggest acute biliary obstruction.  Certainly further workup and evaluation for what appears to be a severe case of associated pancreatitis, hypothermia, altered mental status and multiple critical features are noted     FINAL CLINICAL IMPRESSION(S) / ED DIAGNOSES   Final diagnoses:  Hypothermia, initial encounter  Acute pancreatitis, unspecified complication status, unspecified pancreatitis type  Aspiration pneumonia, unspecified aspiration pneumonia type, unspecified laterality, unspecified part of lung (HCC)  Sepsis, due to unspecified organism, unspecified whether acute organ dysfunction present [facility])     Rx / DC Orders   ED Discharge Orders     None        Note:  This document was prepared using Dragon voice recognition software and may include unintentional dictation errors.   Delman Kitten, MD 12/17/22 1555

## 2022-12-17 NOTE — Consult Note (Signed)
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 Patient is followed in the community by Podiatric Medicine MD, Dr. Kalman Jewels. Last seen by that provider on 12/11/22. Wound type:Neuropathic, infectious Pressure Injury POA: N/A Measurement:Per Dr. Sammuel Bailiff visit last week Dressing procedure/placement/frequency:I will provide conservative guidance for topical care to the LGT using a daily soap and water cleanse, pat dry and applicant of a betadine (povidone-iodine) dressing. This is to be topped with dry gauze and secured with Kerlix roll gauze/paper tape.  Recommend consult with Dr. Cleda Mccreedy, DPM while in house.If you agree, please order/arrange consult.  [facility] nursing team will not follow, but will remain available to this patient, the nursing and medical teams.  Please re-consult if needed.  Thank you for inviting [place] to participate in this patient's Plan of Care.  Maudie Flakes, MSN, RN, CNS, [place], Serita Grammes, [employer], [employer] phone:  [phone]

## 2022-12-17 NOTE — ED Notes (Signed)
This RN attempted report, was told to call back after 7:30.

## 2022-12-17 NOTE — Consult Note (Signed)
Consultation  Referring Provider:     Dr Dillard Cannon Admit date 12/17/22 Consult date        12/17/22 Reason for Consultation:    pancreatitis          HPI:   Jill Smith is a 71 y.o. female with a history of schizophrenia bipolar disorder and hypertension who presented to ED today with AMS found to be hypothermic and septic. Also found to have pancreatitis with gallstone in gallbladder neck but liver enzymes do not suggest obstructive problem at this point. She has been admitted to the ICU service and general surgery to is see patient as well.  Note she was recently treated with augmentin, cipro this month for unknown reason. Patient is currently nonverbal so history is gleaned from chart and nurse. It is reported her family who she lives with  left shortly after patient arrived- and they said the above antibiotics were her only medications. Report is that she was well until earlier today- evidently heat went out last night so everyone added extra blankets, however when family went to wake patient up today they were unable to and she was cold- evidently they tried to give her a warm bath and some hot chocolate which did not help so ems was called and subsequently brought to ED. She has been started on zosyn here and has been bradycardiac due to hypothermia- on warming protocol with warming blankets and warm IVF.Marland Kitchen Labs and imaging as below.  GI has been consulted for pancreatitis. Patient tracks with eyes but does not respond to questions so I cannot obtain further history.  PREVIOUS ENDOSCOPIES:            Colonoscopy 3/17- Dr Gustavo Lah-  small descending colon polyp Colonsocopy 08/2009- Dr Ayesha Mohair- adenomatous polyp Had an adnexal mass removed 2015 at [facility]  Past Medical History:  Diagnosis Date   Bipolar disorder ([place])    Depression    Hypertension    Obesity    Schizophrenia ([place])    Shortness of breath dyspnea    Tubular adenoma of colon     Past Surgical History:  Procedure  Laterality Date   COLONOSCOPY WITH PROPOFOL N/A 01/17/2016   Procedure: COLONOSCOPY WITH PROPOFOL;  Surgeon: Lollie Sails, MD;  Location: [facility] ENDOSCOPY;  Service: Endoscopy;  Laterality: N/A;   COLONOSCOPY WITH PROPOFOL N/A 02/28/2016   Procedure: COLONOSCOPY WITH PROPOFOL;  Surgeon: Lollie Sails, MD;  Location: [facility] ENDOSCOPY;  Service: Endoscopy;  Laterality: N/A;   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 Authorizing Provider  amoxicillin-clavulanate (AUGMENTIN) 875-125 MG tablet Take 1 tablet by mouth 2 (two) times daily. 12/11/22 12/31/22 Yes [provider]  ciprofloxacin (CIPRO) 500 MG tablet Take 500 mg by mouth 2 (two) times daily. 12/11/22 12/31/22 Yes [provider]  metoprolol tartrate (LOPRESSOR) 25 MG tablet Take 25 mg by mouth 2 (two) times daily. 12/06/22  Yes [provider]  [facility] powder  Apply 1 Application topically 2 (two) times daily. 12/05/22  Yes [provider]  amLODipine (NORVASC) 5 MG tablet Take 5 mg by mouth daily.    [provider]  aspirin EC 81 MG tablet Take 81 mg by mouth daily.    [provider]  docusate sodium (COLACE) 100 MG capsule Take 100 mg by mouth daily as needed for mild constipation.     [provider]  isosorbide mononitrate (IMDUR) 60 MG 24 hr tablet Take 60 mg by mouth daily.    [provider]  lisinopril (PRINIVIL,ZESTRIL) 20 MG tablet Take 20 mg by mouth daily.    [provider]  metoprolol tartrate (LOPRESSOR) 50 MG tablet Take  50 mg by mouth 2 (two) times daily.     [provider]  paliperidone (INVEGA SUSTENNA) 234 MG/1.5ML SUSY injection Inject 234 mg into the muscle every 30 (thirty) days.    [provider]  QUEtiapine (SEROQUEL) 100 MG tablet Take 100 mg by mouth at bedtime. 06/15/21   [provider]  SIMBRINZA 1-0.2 % SUSP Place 1 drop into the left eye 3 (three) times daily. Patient not taking: Reported on 12/17/2022 02/20/21   [provider]  vitamin E 400 UNIT capsule Take 800 Units by mouth 2 (two) times daily.     [provider]    Current Facility-Administered Medications  Medication Dose Route Frequency Provider Last Rate Last Admin   docusate sodium (COLACE) capsule 100 mg  100 mg Oral BID PRN Teressa Lower, NP       piperacillin-tazobactam (ZOSYN) IVPB 3.375 g  3.375 g Intravenous Q8H Dolan, Carissa E, RPH       polyethylene glycol (MIRALAX / GLYCOLAX) packet 17 g  17 g Oral Daily PRN Teressa Lower, NP       sodium chloride 0.9 % bolus 1,000 mL  1,000 mL Intravenous Once Teressa Lower, NP       Current Outpatient Medications  Medication Sig Dispense Refill   amoxicillin-clavulanate (AUGMENTIN) 875-125 MG tablet Take 1 tablet by mouth 2 (two) times daily.     ciprofloxacin (CIPRO) 500 MG tablet Take 500 mg by mouth 2 (two) times daily.     metoprolol tartrate (LOPRESSOR) 25 MG tablet Take 25 mg by mouth 2 (two) times daily.     NYAMYC powder Apply 1 Application topically 2 (two) times daily.     amLODipine (NORVASC) 5 MG tablet Take 5 mg by mouth daily.     aspirin EC 81 MG tablet Take 81 mg by mouth daily.     docusate sodium (COLACE) 100 MG capsule Take 100 mg by mouth daily as needed for mild constipation.      isosorbide mononitrate (IMDUR) 60 MG 24 hr tablet Take 60 mg by mouth daily.     lisinopril (PRINIVIL,ZESTRIL) 20 MG tablet Take 20 mg by mouth daily.     metoprolol tartrate (LOPRESSOR) 50 MG tablet Take 50 mg by mouth 2 (two) times daily.       paliperidone (INVEGA SUSTENNA) 234 MG/1.5ML SUSY injection Inject 234 mg into the muscle every 30 (thirty) days.     QUEtiapine (SEROQUEL) 100 MG tablet Take 100 mg by mouth at bedtime.     SIMBRINZA 1-0.2 % SUSP Place 1 drop into the left eye 3 (three) times daily. (Patient not taking: Reported on 12/17/2022)     vitamin E 400 UNIT capsule Take 800 Units by mouth 2 (two) times daily.  Allergies as of 12/17/2022   (No Known Allergies)     Review of Systems:    All systems reviewed and negative except where noted in HPI.     Physical Exam:  Vital signs in last 24 hours: Temp:  [81.7 F (27.6 C)-82.2 F (27.9 C)] 82.2 F (27.9 C) (12/18 1529) Pulse Rate:  [35-73] 38 (12/18 1529) Resp:  [10-17] 16 (12/18 1529) BP: (99-151)/(82-120) 139/82 (12/18 1520) SpO2:  [87 %-100 %] 100 % (12/18 1529)   General:   Pleasant woman in resting in bed under bair hugger and blankets Head:  Normocephalic and atraumatic. Eyes:   No icterus.   Conjunctiva pink. Ears:  Normal auditory acuity. Mouth: Mucosa pink moist, no lesions. Neck:  Supple; no masses felt Lungs:  Respirations even and unlabored. Lungs clear to auscultation bilaterally.   No wheezes, crackles, or rhonchi.  Heart:  S1S2, RRR, no MRG. No edema. She is bradycardic Abdomen:  Umbilicus is reddened with a few satellite lesions.Flat, soft, nondistended, nontender. Normal bowel sounds. No appreciable masses or hepatomegaly. No rebound signs or other peritoneal signs. Rectal:  Not performed.  Msk:  gernalized weaknes with little spontaneous movement.  No clubbing or cyanosis. Strength 5/5. Symmetrical without gross deformities. Neurologic:  Alert;  Cranial nerves appear to be II-XII intact.  Skin:  Cool, dry, pink without significant lesions or rashes. Psych:  Anxious but calm affect  LAB RESULTS: Recent Labs    12/17/22 1401  WBC 3.2*  HGB 14.8  HCT 46.4*  PLT 92*   BMET Recent Labs    12/17/22 1401  NA 141  K  5.2*  CL 107  CO2 28  GLUCOSE 102*  BUN 27*  CREATININE 0.90  CALCIUM 10.4*   LFT Recent Labs    12/17/22 1401  PROT 6.3*  ALBUMIN 3.5  AST 73*  ALT 129*  ALKPHOS 78  BILITOT 0.8   PT/INR Recent Labs    12/17/22 1401  LABPROT 14.1  INR 1.1    STUDIES: CT Head Wo Contrast  Result Date: 12/17/2022 CLINICAL DATA:  Altered mental status EXAM: CT HEAD WITHOUT CONTRAST CT CERVICAL SPINE WITHOUT CONTRAST TECHNIQUE: Multidetector CT imaging of the head and cervical spine was performed following the standard protocol without intravenous contrast. Multiplanar CT image reconstructions of the cervical spine were also generated. RADIATION DOSE REDUCTION: This exam was performed according to the departmental dose-optimization program which includes automated exposure control, adjustment of the mA and/or kV according to patient size and/or use of iterative reconstruction technique. COMPARISON:  CT brain 01/04/2022, CT cervical spine 07/01/2010 FINDINGS: CT HEAD FINDINGS Brain: No acute territorial infarction, hemorrhage, or intracranial mass. Mild atrophy. Patchy white matter hypodensity consistent with chronic small vessel ischemic change. Stable ventricle size. 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 Vascular: No hyperdense vessels. Vertebral and carotid vascular calcification Skull: Normal. Negative for fracture or focal lesion. Sinuses/Orbits: No acute finding. Other: None CT CERVICAL SPINE FINDINGS Alignment: Mild reversal of cervical lordosis. No subluxation. Facet alignment within normal limits Skull base and vertebrae: No acute fracture. No primary bone lesion or focal pathologic process. Soft tissues and spinal canal: Thickened appearance of the prevertebral soft tissues but somewhat similar appearance compared with 2011. Disc levels: Multilevel degenerative change. Moderate disc space narrowing C3 through C7 with prominent anterior osteophytes C4 through C7. Facet  degenerative changes at multiple levels. Upper chest: Heterogeneous airspace opacities at the apices with left effusion. Subcentimeter nodule in the right lobe of  thyroid, no further imaging evaluation is recommended. Other: None IMPRESSION: 1. No CT evidence for acute intracranial abnormality. Atrophy and chronic small vessel ischemic changes of the white matter. 2. Mild reversal of cervical lordosis with degenerative changes. No acute osseous abnormality. 3. Enlarged appearing prevertebral soft tissues without obvious focal fluid collection allowing for absence of contrast. Somewhat similar appearance on CT from 2011 suggesting that findings could be due to habitus and positioning. If there is further concern for soft tissue injury or infection, further assessment with MRI could be considered. 4. Heterogeneous airspace opacities at the apices/possible pneumonia with left effusion. Electronically Signed   By: Donavan Foil M.D.   On: 12/17/2022 15:37   CT Cervical Spine Wo Contrast  Result Date: 12/17/2022 CLINICAL DATA:  Altered mental status EXAM: CT HEAD WITHOUT CONTRAST CT CERVICAL SPINE WITHOUT CONTRAST TECHNIQUE: Multidetector CT imaging of the head and cervical spine was performed following the standard protocol without intravenous contrast. Multiplanar CT image reconstructions of the cervical spine were also generated. RADIATION DOSE REDUCTION: This exam was performed according to the departmental dose-optimization program which includes automated exposure control, adjustment of the mA and/or kV according to patient size and/or use of iterative reconstruction technique. COMPARISON:  CT brain 01/04/2022, CT cervical spine 07/01/2010 FINDINGS: CT HEAD FINDINGS Brain: No acute territorial infarction, hemorrhage, or intracranial mass. Mild atrophy. Patchy white matter hypodensity consistent with chronic small vessel ischemic change. Stable ventricle size. Small chronic infarcts within the basal ganglia and  left thalamus. Vascular: No hyperdense vessels. Vertebral and carotid vascular calcification Skull: Normal. Negative for fracture or focal lesion. Sinuses/Orbits: No acute finding. Other: None CT CERVICAL SPINE FINDINGS Alignment: Mild reversal of cervical lordosis. No subluxation. Facet alignment within normal limits Skull base and vertebrae: No acute fracture. No primary bone lesion or focal pathologic process. Soft tissues and spinal canal: Thickened appearance of the prevertebral soft tissues but somewhat similar appearance compared with 2011. Disc levels: Multilevel degenerative change. Moderate disc space narrowing C3 through C7 with prominent anterior osteophytes C4 through C7. Facet degenerative changes at multiple levels. Upper chest: Heterogeneous airspace opacities at the apices with left effusion. Subcentimeter nodule in the right lobe of thyroid, no further imaging evaluation is recommended. Other: None IMPRESSION: 1. No CT evidence for acute intracranial abnormality. Atrophy and chronic small vessel ischemic changes of the white matter. 2. Mild reversal of cervical lordosis with degenerative changes. No acute osseous abnormality. 3. Enlarged appearing prevertebral soft tissues without obvious focal fluid collection allowing for absence of contrast. Somewhat similar appearance on CT from 2011 suggesting that findings could be due to habitus and positioning. If there is further concern for soft tissue injury or infection, further assessment with MRI could be considered. 4. Heterogeneous airspace opacities at the apices/possible pneumonia with left effusion. Electronically Signed   By: Donavan Foil M.D.   On: 12/17/2022 15:37   CT ABDOMEN PELVIS WO CONTRAST  Result Date: 12/17/2022 CLINICAL DATA:  A 71 year old presents for evaluation of altered mental status. Reported hypothermia. EXAM: CT ABDOMEN AND PELVIS WITHOUT CONTRAST TECHNIQUE: Multidetector CT imaging of the abdomen and pelvis was  performed following the standard protocol without IV contrast. RADIATION DOSE REDUCTION: This exam was performed according to the departmental dose-optimization program which includes automated exposure control, adjustment of the mA and/or kV according to patient size and/or use of iterative reconstruction technique. COMPARISON:  None available. FINDINGS: Lower chest: Basilar atelectasis. Patchy ground-glass and small bilateral pleural effusions greatest on the RIGHT. Heart size  is enlarged. No gross chest wall abnormality. Irregular RIGHT lower lobe pulmonary nodule measuring 7 x 6 mm (image 19/4) Hepatobiliary: Ascites adjacent to the liver. Fissural widening of hepatic fissures. Cholelithiasis. No gross biliary duct dilation or focal hepatic lesion on noncontrast imaging. Pancreas: Peripancreatic stranding worse in the pancreatic-duodenal groove. No visible focal fluid collection though amorphous stranding tracks into the transverse mesocolon. Stranding is moderate to marked. Amorphous fluid density in the transverse mesocolon and or lesser sac (image 39/2) 6 x 2.4 cm measuring 20 for to 28 Hounsfield units Spleen: Unremarkable aside from trace perisplenic fluid. Adrenals/Urinary Tract: Stranding adjacent to the LEFT adrenal gland. Mild stranding adjacent to RIGHT adrenal gland. Adreniform thickening of bilateral adrenal glands. Mild perinephric stranding. No hydronephrosis. No perivesical stranding. Stomach/Bowel: Small bowel without dilation or inflammation. Stranding in the central abdominal mesenteries and transverse mesocolon in the setting of presumed pancreatitis. Normal appendix. Rectal temperature probe in place. Mild thickening of the splenic flexure and transverse colon likely secondary to pancreatic inflammation Vascular/Lymphatic: Aortic atherosclerosis. No sign of aneurysm. Smooth contour of the IVC. There is no gastrohepatic or hepatoduodenal ligament lymphadenopathy. No retroperitoneal or  mesenteric lymphadenopathy. No pelvic sidewall lymphadenopathy. Reproductive: Unremarkable by CT. Other: Large abdominal wall pannus. This is incompletely assessed but no gross stranding or skin thickening in imaged portions. Small volume ascites. No pelvic ascites currently. Musculoskeletal: No acute bone finding. No destructive bone process. Spinal degenerative changes. IMPRESSION: 1. Findings of acute pancreatitis with early peripancreatic necrosis in the transverse mesocolon versus developing phlegmon, no organized collection best characterized as interstitial edematous pancreatitis at this time. Fluid also tracks into the anterior pararenal space. Changes are moderate to marked in terms of inflammation. 2. Cholelithiasis with signs of 8 mm common bile duct. Correlate with any laboratory evidence of biliary obstruction. Large gallstone seen in the neck of the gallbladder as well. MRCP may be helpful for further evaluation as warranted. 3. Small volume ascites and bilateral pleural effusions. 4. Adrenal hyperplasia. Could consider correlation with adrenal function if not yet performed. 5. Patchy ground-glass and small bilateral pleural effusions greatest on the RIGHT. Correlate with any potential for aspiration. Given asymmetry this is considered. 6. Irregular RIGHT lower lobe pulmonary nodule measuring 7 x 6 mm. Non-contrast chest CT at 6-12 months is recommended. If the nodule is stable at time of repeat CT, then future CT at 18-24 months (from today's scan) is considered optional for low-risk patients, but is recommended for high-risk patients. This recommendation follows the consensus statement: Guidelines for Management of Incidental Pulmonary Nodules Detected on CT Images: From the Fleischner Society 2017; Radiology 2017; 284:228-243. Aortic Atherosclerosis (ICD10-I70.0). Above findings related to potential pancreatitis were related to the provider is outlined below. These results were called by telephone  at the time of interpretation on 12/17/2022 at 3:35 pm to provider MARK QUALE , who verbally acknowledged these results. Electronically Signed   By: Zetta Bills M.D.   On: 12/17/2022 15:36   DG Chest Port 1 View  Result Date: 12/17/2022 CLINICAL DATA:  Questionable sepsis, nonverbal, not baseline EXAM: PORTABLE CHEST 1 VIEW COMPARISON:  Portable exam 1422 hours compared to 02/09/2019 FINDINGS: External pacing leads project over chest. Rotated to the RIGHT. Normal heart size and mediastinal contours. Lungs grossly clear. No definite infiltrate, pleural effusion, or pneumothorax. IMPRESSION: No definite acute abnormalities. Electronically Signed   By: Lavonia Dana M.D.   On: 12/17/2022 14:43       Impression / Plan:   Pancreatitis- agree  with surgical consult for gallstone stuck in gb neck. Liver enzyme pattern not obstructive. Considering mrcp but for now would recommend fluid resuscitation and following clinically. Will add UDS and lipase to labs. Agree with zosyn.  Abrupt mental status changes with Hypothermia, signs of ongoing illness, and sepsis in mental health patient who is not on her psych meds presently- social service consult  Thank you very much for this consult. These services were provided by Stephens November, NP-C, in collaboration with Lesly Rubenstein MD, with whom I have discussed this patient in full.   Stephens November, NP-C

## 2022-12-17 NOTE — ED Notes (Signed)
RN attempted report again.

## 2022-12-17 NOTE — H&P (Addendum)
NAME:  Jill Smith, MRN:  [id number], DOB:  [birth year], LOS: 0 ADMISSION DATE:  12/17/2022, CONSULTATION DATE: 12/17/22 REFERRING MD: Dr. Jacqualine Code, CHIEF COMPLAINT: AMS  History of Present Illness:  This is a 71 yo female who presented to [facility] ER on 12/18 from home via EMS with altered mental status and hypothermia.  Per EMS when they arrived at pts home she was non-verbal which is not her baseline, and cold to touch with inability to obtain a temp.  Pts family reported the heater in their home stopped working the night of 12/17, and pt was found in this state the morning of 12/18 prompting EMS notification.    ED Course  Upon arrival to the ER vital signs were: temp 81.8 F rectally/bp 154/87/hr 36/ O2 sats 87% on RA.  Bear hugger applied and pt received 1L of warm saline due to severe hypothermia.  Lab results were: K+ 5.2/calcium 10.4/AST 73/ALT 129/total protein 6.3/wbc 3.2/platelet count 92.  COVID-19/Influenza A&B by PCR negative.    EKG: Sinus Bradycardia; heart rate 34, prolonged pr interval, no ST abnormality    CT Head/Cervical Spine: . No CT evidence for acute intracranial abnormality. Atrophy and chronic small vessel ischemic changes of the white matter. Mild reversal of cervical lordosis with degenerative changes. No acute osseous abnormality. Enlarged appearing prevertebral soft tissues without obvious focal fluid collection allowing for absence of contrast. Somewhat similar appearance on CT from 2011 suggesting that findings could be due to habitus and positioning. If there is further concern for soft tissue injury or infection, further assessment with MRI could be considered. Heterogeneous airspace opacities at the apices/possible pneumonia with left effusion.  CT Abd Pelvis:  Findings of acute pancreatitis with early peripancreatic necrosis in the transverse mesocolon versus developing phlegmon, no organized collection best characterized as interstitial edematous pancreatitis at  this time. Fluid also tracks into the anterior pararenal space. Changes are moderate to marked in terms of inflammation. Cholelithiasis with signs of 8 mm common bile duct. Correlate with any laboratory evidence of biliary obstruction. Large gallstone seen in the neck of the gallbladder as well. MRCP may be helpful for further evaluation as warranted. Small volume ascites and bilateral pleural effusions. Adrenal hyperplasia. Could consider correlation with adrenal function if not yet performed. Patchy ground-glass and small bilateral pleural effusions greatest on the RIGHT. Correlate with any potential for aspiration. Given asymmetry this is considered. Irregular RIGHT lower lobe pulmonary nodule measuring 7 x 6 mm. Non-contrast chest CT at 6-12 months is recommended. If the nodule is stable at time of repeat CT, then future CT at 18-24 months (from today's scan) is considered optional for low-risk patients, but is recommended for high-risk patients. This recommendation follows the consensus statement: Guidelines for Management of Incidental Pulmonary Nodules Detected on CT Images: From the Fleischner Society 2017; Radiology 2017; 284:228-243.   Pertinent  Medical History  Bipolar Disorder Depression HTN Obesity Schizophrenia  Tubular Adenoma of Colon   Significant Hospital Events: Including procedures, antibiotic start and stop dates in addition to other pertinent events   12/18: Pt admitted to ICU with acute metabolic encephalopathy, severe hypothermia (reported heater was nonfunctional at home) and sepsis secondary to pneumonia and acute pancreatitis   Interim History / Subjective:  Pt currently nonverbal and unable to follow commands.  Bradycardic on cardiac monitor hr 37.    Objective   Blood pressure 139/82, pulse (!) 36, temperature (!) 82 F (27.8 C), temperature source Rectal, resp. rate 12, SpO2 100 %.  Intake/Output Summary (Last 24 hours) at 12/17/2022 1521 Last data filed  at 12/17/2022 1520 Gross per 24 hour  Intake 1000 ml  Output --  Net 1000 ml   There were no vitals filed for this visit.  Examination: General: Acute on chronically-ill appearing female, NAD on RA  HENT: Supple, no JVD  Lungs: Diminished throughout, even, non labored  Cardiovascular: Sinus bradycardia, s1s2, no r/g, 2+ radial/1+ distal pulses, no edema  Abdomen: +BS x4, obese, soft, non tender, non distended, yeast present at abdominal folds  Extremities: Normal bulk.  Extremities cool to touch, left great toe ulceration  Neuro: Awake but nonverbal and unable to follow commands, bilateral pinpoint pupils very sluggish  GU: Pending temp indwelling foley placement   Resolved Hospital Problem list     Assessment & Plan:   Sepsis secondary to acute pancreatitis with early peripancreatic necrosis and pneumonia  Cholelithiasis and large gallstone in the neck of the gallbladder   - Trend WBC and monitor fever curve  - Trend PCT  - Follow cultures  - Continue zosyn  - GI and General Surgery consulted appreciate input - Once severe hypothermia has resolved will obtain MRCP to further assess gallstone  - Aggressive iv fluid resuscitation   Sinus bradycardia likely secondary to severe hypothermia  - Continuous telemetry monitoring  - Maintain map >65 - Hold outpatient antihypertensives and beta-blockers   Mild hyperkalemia  - Trend BMP  - Replace electrolytes as indicated  - Monitor UOP  Elevated liver enzymes likely secondary to acute pancreatitis  - Trend hepatic function panel  - Avoid hepatotoxic medications   Hypothermia secondary to sepsis and reported nonfunctioning heater at home - Bear-hugger to maintain normothermia   Thrombocytopenia  - Trend CBC  - Monitor for s/sx of bleeding  - SCD's for VTE px; avoid chemical px for now   Left great toe ulceration present on admission s/p outpatient debridement per podiatry on 12/11/22 - Wound care consulted appreciate  input  - Left foot x-ray to assess for osteomyelitis   Acute metabolic encephalopathy in the setting of severe hypothermia and sepsis  Hx: Schizophrenia and bipolar disorder - Correct metabolic derangements - Avoid sedating medications when able - Provide supportive care  - Hold outpatient seroquel for now    Best Practice (right click and "Reselect all SmartList Selections" daily)   Diet/type: NPO DVT prophylaxis: SCD GI prophylaxis: N/A Lines: N/A Foley:  Yes, and it is still needed Code Status:  full code Last date of multidisciplinary goals of care discussion [N/A]  12/18: Updated pts sister Gardiner Sleeper via telephone regarding pt condition and plan of care.  Also confirmed code status Mrs. Charlynn Grimes stated pt is Full Code  Labs   CBC: Recent Labs  Lab 12/17/22 1401  WBC 3.2*  NEUTROABS 2.1  HGB 14.8  HCT 46.4*  MCV 88.2  PLT 92*    Basic Metabolic Panel: Recent Labs  Lab 12/17/22 1401  NA 141  K 5.2*  CL 107  CO2 28  GLUCOSE 102*  BUN 27*  CREATININE 0.90  CALCIUM 10.4*   GFR: CrCl cannot be calculated (Unknown ideal weight.). Recent Labs  Lab 12/17/22 1401  WBC 3.2*  LATICACIDVEN 1.6    Liver Function Tests: Recent Labs  Lab 12/17/22 1401  AST 73*  ALT 129*  ALKPHOS 78  BILITOT 0.8  PROT 6.3*  ALBUMIN 3.5   No results for input(s): "LIPASE", "AMYLASE" in the last 168 hours. No results for input(s): "AMMONIA" in  the last 168 hours.  ABG No results found for: "PHART", "PCO2ART", "PO2ART", "HCO3", "TCO2", "ACIDBASEDEF", "O2SAT"   Coagulation Profile: Recent Labs  Lab 12/17/22 1401  INR 1.1    Cardiac Enzymes: Recent Labs  Lab 12/17/22 1401  CKTOTAL 69    HbA1C: Hgb A1c MFr Bld  Date/Time Value Ref Range Status  02/09/2019 04:13 PM 5.9 (H) 4.8 - 5.6 % Final    Comment:    (NOTE) Pre diabetes:          5.7%-6.4% Diabetes:              >6.4% Glycemic control for   <7.0% adults with diabetes     CBG: No results for  input(s): "GLUCAP" in the last 168 hours.  Review of Systems:   Unable to assess pt nonverbal at this time   Past Medical History:  She,  has a past medical history of Bipolar disorder ([place]), Depression, Hypertension, Obesity, Schizophrenia ([place]), Shortness of breath dyspnea, and Tubular adenoma of colon.   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 Her smoking use included cigarettes. She has never used smokeless tobacco. She reports that she does not drink alcohol and does not use drugs.   Family History:  Her family history includes Diabetes in her father and mother; Hypertension in her mother; Prostate cancer in her father. There is no history of Breast cancer.   Allergies No Known Allergies   Home Medications  Prior to Admission medications   Medication Sig Start Date End Date Taking? Authorizing Provider  amoxicillin-clavulanate (AUGMENTIN) 875-125 MG tablet Take 1 tablet by mouth 2 (two) times daily. 12/11/22 12/31/22 Yes [provider]  ciprofloxacin (CIPRO) 500 MG tablet Take 500 mg by mouth 2 (two) times daily. 12/11/22 12/31/22 Yes [provider]  metoprolol tartrate (LOPRESSOR) 25 MG tablet Take 25 mg by mouth 2 (two) times daily. 12/06/22  Yes [provider]  [facility] powder Apply 1 Application topically 2 (two) times daily. 12/05/22  Yes [provider]  amLODipine (NORVASC) 5 MG tablet Take 5 mg by mouth daily.    [provider]   aspirin EC 81 MG tablet Take 81 mg by mouth daily.    [provider]  docusate sodium (COLACE) 100 MG capsule Take 100 mg by mouth daily as needed for mild constipation.     [provider]  isosorbide mononitrate (IMDUR) 60 MG 24 hr tablet Take 60 mg by mouth daily.    [provider]  lisinopril (PRINIVIL,ZESTRIL) 20 MG tablet Take 20 mg by mouth daily.    [provider]  metoprolol tartrate (LOPRESSOR) 50 MG tablet Take 50 mg by mouth 2 (two) times daily.     [provider]  paliperidone (INVEGA SUSTENNA) 234 MG/1.5ML SUSY injection Inject 234 mg into the muscle every 30 (thirty) days.    [provider]  QUEtiapine (SEROQUEL) 100 MG tablet Take 100 mg by mouth at bedtime. 06/15/21   [provider]  SIMBRINZA 1-0.2 % SUSP Place 1 drop into the left eye 3 (three) times daily. Patient not taking: Reported on 12/17/2022 02/20/21   [provider]  vitamin E 400 UNIT capsule Take 800 Units by mouth 2 (two) times daily.     [provider]     Critical care time: 65 minutes      Donell Beers, [place] Pager [phone] (please enter 7 digits) PCCM Consult Pager [phone] (please enter 7 digits)

## 2022-12-17 NOTE — Sepsis Progress Note (Signed)
Sepsis protocol monitored by eLink 

## 2022-12-17 NOTE — ED Notes (Signed)
Bear hugger placed on pt per md vebal order.

## 2022-12-18 ENCOUNTER — Inpatient Hospital Stay
Admit: 2022-12-18 | Discharge: 2022-12-18 | Disposition: A | Discharge status: Home / Self Care | Payer: Medicare HMO | Attending: Student in an Organized Health Care Education/Training Program | Admitting: Student in an Organized Health Care Education/Training Program

## 2022-12-18 ENCOUNTER — Inpatient Hospital Stay: Payer: Medicare HMO

## 2022-12-18 DIAGNOSIS — K8000 Calculus of gallbladder with acute cholecystitis without obstruction: Secondary | ICD-10-CM

## 2022-12-18 DIAGNOSIS — A419 Sepsis, unspecified organism: Secondary | ICD-10-CM

## 2022-12-18 DIAGNOSIS — K859 Acute pancreatitis without necrosis or infection, unspecified: Secondary | ICD-10-CM

## 2022-12-18 DIAGNOSIS — J690 Pneumonitis due to inhalation of food and vomit: Secondary | ICD-10-CM

## 2022-12-18 DIAGNOSIS — I469 Cardiac arrest, cause unspecified: Secondary | ICD-10-CM

## 2022-12-18 DIAGNOSIS — K851 Biliary acute pancreatitis without necrosis or infection: Secondary | ICD-10-CM | POA: Diagnosis not present

## 2022-12-18 DIAGNOSIS — T68XXXA Hypothermia, initial encounter: Secondary | ICD-10-CM | POA: Diagnosis not present

## 2022-12-18 DIAGNOSIS — R4182 Altered mental status, unspecified: Secondary | ICD-10-CM | POA: Diagnosis not present

## 2022-12-18 DIAGNOSIS — L8990 Pressure ulcer of unspecified site, unspecified stage: Secondary | ICD-10-CM | POA: Insufficient documentation

## 2022-12-18 LAB — BLOOD GAS, ARTERIAL
Acid-Base Excess: 5.4 mmol/L — ABNORMAL HIGH (ref 0.0–2.0)
Bicarbonate: 31.1 mmol/L — ABNORMAL HIGH (ref 20.0–28.0)
FIO2: 28 %
MECHVT: 450 mL
Mechanical Rate: 15
O2 Saturation: 99.5 %
PEEP: 5 cmH2O
Patient temperature: 37
RATE: 15 resp/min
Spontaneous VT: 450 mL
pCO2 arterial: 49 mmHg — ABNORMAL HIGH (ref 32–48)
pH, Arterial: 7.41 (ref 7.35–7.45)
pO2, Arterial: 105 mmHg (ref 83–108)

## 2022-12-18 LAB — ECHOCARDIOGRAM COMPLETE
AR max vel: 2.46 cm2
AV Area VTI: 2.58 cm2
AV Area mean vel: 2.24 cm2
AV Mean grad: 3 mmHg
AV Peak grad: 6.7 mmHg
Ao pk vel: 1.29 m/s
Area-P 1/2: 3.05 cm2
Height: 69 in
P 1/2 time: 805 msec
S' Lateral: 2.9 cm
Weight: 4127.01 oz

## 2022-12-18 LAB — CBC
HCT: 38.9 % (ref 36.0–46.0)
Hemoglobin: 12.6 g/dL (ref 12.0–15.0)
MCH: 28 pg (ref 26.0–34.0)
MCHC: 32.4 g/dL (ref 30.0–36.0)
MCV: 86.4 fL (ref 80.0–100.0)
Platelets: 95 10*3/uL — ABNORMAL LOW (ref 150–400)
RBC: 4.5 MIL/uL (ref 3.87–5.11)
RDW: 14.1 % (ref 11.5–15.5)
WBC: 8 10*3/uL (ref 4.0–10.5)
nRBC: 0.5 % — ABNORMAL HIGH (ref 0.0–0.2)

## 2022-12-18 LAB — BASIC METABOLIC PANEL
Anion gap: 4 — ABNORMAL LOW (ref 5–15)
Anion gap: 6 (ref 5–15)
BUN: 32 mg/dL — ABNORMAL HIGH (ref 8–23)
BUN: 35 mg/dL — ABNORMAL HIGH (ref 8–23)
CO2: 27 mmol/L (ref 22–32)
CO2: 27 mmol/L (ref 22–32)
Calcium: 10.5 mg/dL — ABNORMAL HIGH (ref 8.9–10.3)
Calcium: 10.2 mg/dL (ref 8.9–10.3)
Chloride: 112 mmol/L — ABNORMAL HIGH (ref 98–111)
Chloride: 109 mmol/L (ref 98–111)
Creatinine, Ser: 1.51 mg/dL — ABNORMAL HIGH (ref 0.44–1.00)
Creatinine, Ser: 1.65 mg/dL — ABNORMAL HIGH (ref 0.44–1.00)
GFR, Estimated: 37 mL/min — ABNORMAL LOW (ref >60)
GFR, Estimated: 33 mL/min — ABNORMAL LOW (ref >60)
Glucose, Bld: 110 mg/dL — ABNORMAL HIGH (ref 70–99)
Glucose, Bld: 127 mg/dL — ABNORMAL HIGH (ref 70–99)
Potassium: 4.9 mmol/L (ref 3.5–5.1)
Potassium: 4.9 mmol/L (ref 3.5–5.1)
Sodium: 143 mmol/L (ref 135–145)
Sodium: 142 mmol/L (ref 135–145)

## 2022-12-18 LAB — GLUCOSE, CAPILLARY
Glucose-Capillary: 35 mg/dL — CL (ref 70–99)
Glucose-Capillary: 126 mg/dL — ABNORMAL HIGH (ref 70–99)
Glucose-Capillary: 82 mg/dL (ref 70–99)
Glucose-Capillary: 79 mg/dL (ref 70–99)
Glucose-Capillary: 98 mg/dL (ref 70–99)
Glucose-Capillary: 108 mg/dL — ABNORMAL HIGH (ref 70–99)
Glucose-Capillary: 119 mg/dL — ABNORMAL HIGH (ref 70–99)
Glucose-Capillary: 129 mg/dL — ABNORMAL HIGH (ref 70–99)
Glucose-Capillary: 132 mg/dL — ABNORMAL HIGH (ref 70–99)

## 2022-12-18 LAB — TROPONIN I (HIGH SENSITIVITY)
Troponin I (High Sensitivity): 13 ng/L (ref <18)
Troponin I (High Sensitivity): 22 ng/L — ABNORMAL HIGH (ref <18)
Troponin I (High Sensitivity): 29 ng/L — ABNORMAL HIGH (ref <18)

## 2022-12-18 LAB — MAGNESIUM
Magnesium: 2.3 mg/dL (ref 1.7–2.4)
Magnesium: 2.4 mg/dL (ref 1.7–2.4)
Magnesium: 2.3 mg/dL (ref 1.7–2.4)

## 2022-12-18 LAB — PHOSPHORUS
Phosphorus: 3.5 mg/dL (ref 2.5–4.6)
Phosphorus: 3.4 mg/dL (ref 2.5–4.6)
Phosphorus: 3.6 mg/dL (ref 2.5–4.6)

## 2022-12-18 LAB — COMPREHENSIVE METABOLIC PANEL
ALT: 481 U/L — ABNORMAL HIGH (ref 0–44)
AST: 370 U/L — ABNORMAL HIGH (ref 15–41)
Albumin: 2.9 g/dL — ABNORMAL LOW (ref 3.5–5.0)
Alkaline Phosphatase: 77 U/L (ref 38–126)
Anion gap: 7 (ref 5–15)
BUN: 28 mg/dL — ABNORMAL HIGH (ref 8–23)
CO2: 26 mmol/L (ref 22–32)
Calcium: 10 mg/dL (ref 8.9–10.3)
Chloride: 111 mmol/L (ref 98–111)
Creatinine, Ser: 1.16 mg/dL — ABNORMAL HIGH (ref 0.44–1.00)
GFR, Estimated: 50 mL/min — ABNORMAL LOW (ref >60)
Glucose, Bld: 42 mg/dL — CL (ref 70–99)
Potassium: 4.3 mmol/L (ref 3.5–5.1)
Sodium: 144 mmol/L (ref 135–145)
Total Bilirubin: 1 mg/dL (ref 0.3–1.2)
Total Protein: 5.4 g/dL — ABNORMAL LOW (ref 6.5–8.1)

## 2022-12-18 LAB — URINE CULTURE: Culture: NO GROWTH

## 2022-12-18 LAB — LACTIC ACID, PLASMA
Lactic Acid, Venous: 3.1 mmol/L (ref 0.5–1.9)
Lactic Acid, Venous: 2.4 mmol/L (ref 0.5–1.9)
Lactic Acid, Venous: 1.5 mmol/L (ref 0.5–1.9)

## 2022-12-18 LAB — PROCALCITONIN: Procalcitonin: <0.1 ng/mL

## 2022-12-18 LAB — D-DIMER, QUANTITATIVE
D-Dimer, Quant: 5.07 ug/mL-FEU — ABNORMAL HIGH (ref 0.00–0.50)
D-Dimer, Quant: 4.16 ug/mL-FEU — ABNORMAL HIGH (ref 0.00–0.50)

## 2022-12-18 LAB — LIPASE, BLOOD
Lipase: 380 U/L — ABNORMAL HIGH (ref 11–51)
Lipase: 533 U/L — ABNORMAL HIGH (ref 11–51)

## 2022-12-18 LAB — FIBRINOGEN: Fibrinogen: 279 mg/dL (ref 210–475)

## 2022-12-18 LAB — TRIGLYCERIDES: Triglycerides: 41 mg/dL (ref <150)

## 2022-12-18 MED ORDER — HYDRALAZINE HCL 20 MG/ML IJ SOLN
5.0000 mg | Freq: Once | INTRAMUSCULAR | Status: AC
  Administered 2022-12-18: 5 mg via INTRAVENOUS
  Filled 2022-12-18: qty 1

## 2022-12-18 MED ORDER — VASOPRESSIN 20 UNITS/100 ML INFUSION FOR SHOCK
0.0000 [IU]/min | INTRAVENOUS | Status: DC
  Administered 2022-12-18: 0.02 [IU]/min via INTRAVENOUS
  Administered 2022-12-18: 0.03 [IU]/min via INTRAVENOUS
  Filled 2022-12-18 (×5): qty 100

## 2022-12-18 MED ORDER — VITAL AF 1.2 CAL PO LIQD
1000.0000 mL | ORAL | Status: DC
  Administered 2022-12-18 – 2022-12-19 (×2): 1000 mL

## 2022-12-18 MED ORDER — PANTOPRAZOLE SODIUM 40 MG IV SOLR
40.0000 mg | Freq: Every day | INTRAVENOUS | Status: DC
  Administered 2022-12-18 – 2022-12-25 (×8): 40 mg via INTRAVENOUS
  Filled 2022-12-18 (×8): qty 10

## 2022-12-18 MED ORDER — FENTANYL 2500MCG IN NS 250ML (10MCG/ML) PREMIX INFUSION: 0.0000 ug/h | INTRAVENOUS | Status: DC

## 2022-12-18 MED ORDER — FREE WATER
30.0000 mL | Status: DC
  Administered 2022-12-18 – 2022-12-20 (×11): 30 mL

## 2022-12-18 MED ORDER — DEXTROSE 50 % IV SOLN
INTRAVENOUS | Status: AC
  Filled 2022-12-18: qty 50

## 2022-12-18 MED ORDER — DEXTROSE-NACL 5-0.45 % IV SOLN: INTRAVENOUS | Status: DC

## 2022-12-18 MED ORDER — ATROPINE SULFATE 1 MG/10ML IJ SOSY
1.0000 mg | PREFILLED_SYRINGE | INTRAMUSCULAR | Status: DC | PRN
  Filled 2022-12-18: qty 10

## 2022-12-18 MED ORDER — NOREPINEPHRINE 16 MG/250ML-% IV SOLN
0.0000 ug/min | INTRAVENOUS | Status: DC
  Administered 2022-12-18: 3 ug/min via INTRAVENOUS
  Administered 2022-12-18: 8 ug/min via INTRAVENOUS
  Filled 2022-12-18: qty 250

## 2022-12-18 MED ORDER — FENTANYL 2500MCG IN NS 250ML (10MCG/ML) PREMIX INFUSION
INTRAVENOUS | Status: AC
  Administered 2022-12-18: 50 ug/h via INTRAVENOUS
  Filled 2022-12-18: qty 250

## 2022-12-18 MED ORDER — HYDROCORTISONE SOD SUC (PF) 100 MG IJ SOLR
100.0000 mg | Freq: Four times a day (QID) | INTRAMUSCULAR | Status: DC
  Administered 2022-12-18 – 2022-12-19 (×4): 100 mg via INTRAVENOUS
  Filled 2022-12-18 (×4): qty 2

## 2022-12-18 MED ORDER — PROPOFOL 1000 MG/100ML IV EMUL: 5.0000 ug/kg/min | INTRAVENOUS | Status: DC

## 2022-12-18 MED ORDER — FENTANYL CITRATE PF 50 MCG/ML IJ SOSY
25.0000 ug | PREFILLED_SYRINGE | INTRAMUSCULAR | Status: DC | PRN
  Administered 2022-12-19: 25 ug via INTRAVENOUS
  Administered 2022-12-19 – 2022-12-20 (×6): 50 ug via INTRAVENOUS
  Filled 2022-12-18 (×8): qty 1

## 2022-12-18 MED ORDER — HEPARIN SODIUM (PORCINE) 5000 UNIT/ML IJ SOLN
5000.0000 [IU] | Freq: Two times a day (BID) | INTRAMUSCULAR | Status: DC
  Administered 2022-12-18 – 2022-12-29 (×15): 5000 [IU] via SUBCUTANEOUS
  Filled 2022-12-18 (×17): qty 1

## 2022-12-18 MED ORDER — DEXTROSE 50 % IV SOLN
1.0000 | Freq: Once | INTRAVENOUS | Status: AC
  Administered 2022-12-18: 50 mL via INTRAVENOUS

## 2022-12-18 MED ORDER — LORAZEPAM 2 MG/ML IJ SOLN
2.0000 mg | Freq: Once | INTRAMUSCULAR | Status: AC
  Administered 2022-12-18: 2 mg via INTRAVENOUS
  Filled 2022-12-18: qty 1

## 2022-12-18 NOTE — Procedures (Addendum)
Arterial Catheter Insertion Procedure Note  FIORELA PELZER  [id number]  1951-06-19  Date:12/18/22  Time:2:31 AM    Provider Performing: Karen Kays   Procedure: Insertion of Arterial Line [id number]) with US guidance (42103)   Indication(s) Blood pressure monitoring and/or need for frequent ABGs  Consent Unable to obtain consent due to emergent nature of procedure.  Anesthesia None  Time Out Verified patient identification, verified procedure, site/side was marked, verified correct patient position, special equipment/implants available, medications/allergies/relevant history reviewed, required imaging and test results available.  Sterile Technique Maximal sterile technique including full sterile barrier drape, hand hygiene, sterile gown, sterile gloves, mask, hair covering, sterile ultrasound probe cover (if used).  Procedure Description Area of catheter insertion was cleaned with chlorhexidine and draped in sterile fashion. With real-time ultrasound guidance an arterial catheter was placed into the right femoral artery.  Appropriate arterial tracings confirmed on monitor.    Complications/Tolerance None; patient tolerated the procedure well.  EBL Minimal  Specimen(s) None   Rufina Falco, DNP, CCRN, FNP-C, AGACNP-BC Acute Care & Family Nurse Practitioner  [facility]  See Amion for personal pager PCCM on call pager [phone] until 7 am

## 2022-12-18 NOTE — Progress Notes (Signed)
GI Inpatient Follow-up Note  Subjective:  Patient seen and is stable. Coded last night.  Scheduled Inpatient Medications:   Chlorhexidine Gluconate Cloth  6 each Topical Daily   feeding supplement (VITAL AF 1.2 CAL)  1,000 mL Per Tube Q24H   free water  30 mL Per Tube Q4H   heparin injection (subcutaneous)  5,000 Units Subcutaneous Q12H   hydrocortisone sod succinate (SOLU-CORTEF) inj  100 mg Intravenous Q6H   nystatin   Topical TID   pantoprazole (PROTONIX) IV  40 mg Intravenous Daily    Continuous Inpatient Infusions:    sodium chloride     dextrose 5 % and 0.45% NaCl 75 mL/hr at 12/18/22 1500   norepinephrine (LEVOPHED) Adult infusion 2 mcg/min (12/18/22 0653)   piperacillin-tazobactam (ZOSYN)  IV Stopped (12/18/22 1237)   propofol (DIPRIVAN) infusion Stopped (12/18/22 0800)   vasopressin 0.02 Units/min (12/18/22 1500)    PRN Inpatient Medications:  atropine, docusate sodium, fentaNYL (SUBLIMAZE) injection, polyethylene glycol  Review of Systems:  Unable to assess due to mental status  Physical Examination: BP (!) 80/49   Pulse 76   Temp (!) 92.8 F (33.8 C)   Resp 19   Ht '5\' 9"'$  (1.753 m)   Wt 117 kg   SpO2 97%   BMI 38.09 kg/m  Gen: Intubated HEENT: PEERLA Neck: supple CV: RRR Abd: soft, NT, ND Ext: trace edema Skin: no rash or lesions noted Lymph: no LAD  Data: Lab Results  Component Value Date   WBC 8.0 12/18/2022   HGB 12.6 12/18/2022   HCT 38.9 12/18/2022   MCV 86.4 12/18/2022   PLT 95 (L) 12/18/2022   Recent Labs  Lab 12/17/22 1401 12/18/22 0122  HGB 14.8 12.6   Lab Results  Component Value Date   NA 143 12/18/2022   K 4.9 12/18/2022   CL 112 (H) 12/18/2022   CO2 27 12/18/2022   BUN 32 (H) 12/18/2022   CREATININE 1.51 (H) 12/18/2022   Lab Results  Component Value Date   ALT 481 (H) 12/18/2022   AST 370 (H) 12/18/2022   ALKPHOS 77 12/18/2022   BILITOT 1.0 12/18/2022   Recent Labs  Lab 12/17/22 1401  APTT 34  INR 1.1    Assessment/Plan: Jill Smith is a 71 y.o. lady with severe pancreatitis and cholelithiasis, no evidence of biliary obstruction. The elevation of her LFT's today is likely secondary to ischemic hepatitis from code blue event. Anticipate they will likely worsen before improving  Recommendations:  - continue supportive care - given severity of pancreatitis, likely with poor outcome - trend LFT's, no evidence of biliary obstruction  One of my colleagues is available if any questions or concerns.  Jill Miyamoto MD, MPH [place]

## 2022-12-18 NOTE — Progress Notes (Signed)
[facility] SURGICAL PROGRESS NOTE (cpt [phone])  Hospital Day(s): 1.   Interval History:  Patient seen and examined Overnight, patient had code blue for asystole, received 5 minutes of ACLs with ROSC Intubated/ventilated Continues to be hypothermic  WBC normal; 8.0K Mild AKI; sCr - 1.16; UO - 550 ccs No electrolyte derangements Lactic acidosis improving to 2.4 (from 3.1)  Hypoglycemia improving; now 82 (low of 35 overnight) She is on Zosyn   Review of Systems:  Unobtainable.  Vital signs in last 24 hours: [min-max] current  Temp:  [81.7 F (27.6 C)-91.9 F (33.3 C)] 91.8 F (33.2 C) (12/19 0600) Pulse Rate:  [35-106] 49 (12/19 0600) Resp:  [10-27] 15 (12/19 0600) BP: (61-203)/(39-163) 119/83 (12/19 0600) SpO2:  [87 %-100 %] 100 % (12/19 0600) FiO2 (%):  [50 %] 50 % (12/19 0224) Weight:  [604 kg] 117 kg (12/19 0143)     Height: '5\' 9"'$  (175.3 cm) Weight: 117 kg BMI (Calculated): 38.07   Intake/Output last 2 shifts:  12/18 0701 - 12/19 0700 In: 3623.1 [I.V.:523.1; IV Piggyback:3100] Out: 550 [Urine:550]   Physical Exam:  Constitutional: alert, cooperative and no distress  HENT: normocephalic without obvious abnormality  Eyes: open, anicteric Respiratory: ventilated  Cardiovascular: brady Gastrointestinal: soft, unreliable exam. Obese.    Labs:     Latest Ref Rng & Units 12/18/2022    1:22 AM 12/17/2022    2:01 PM 07/08/2021   11:05 AM  CBC  WBC 4.0 - 10.5 K/uL 8.0  3.2  5.4   Hemoglobin 12.0 - 15.0 g/dL 12.6  14.8  14.3   Hematocrit 36.0 - 46.0 % 38.9  46.4  44.2   Platelets 150 - 400 K/uL 95  92  192       Latest Ref Rng & Units 12/18/2022    1:22 AM 12/17/2022    2:01 PM 07/08/2021   11:05 AM  CMP  Glucose 70 - 99 mg/dL 42  102  107   BUN 8 - 23 mg/dL '28  27  14   '$ Creatinine 0.44 - 1.00 mg/dL 1.16  0.90  1.03   Sodium 135 - 145 mmol/L 144  141  138   Potassium 3.5 - 5.1 mmol/L 4.3  5.2  4.4   Chloride 98 - 111 mmol/L 111  107  105    CO2 22 - 32 mmol/L '26  28  25   '$ Calcium 8.9 - 10.3 mg/dL 10.0  10.4  10.2   Total Protein 6.5 - 8.1 g/dL 5.4  6.3  6.8   Total Bilirubin 0.3 - 1.2 mg/dL 1.0  0.8  1.1   Alkaline Phos 38 - 126 U/L 77  78  55   AST 15 - 41 U/L 370  73  20   ALT 0 - 44 U/L 481  129  15      Imaging studies: CLINICAL DATA:  Central line placement   EXAM: PORTABLE CHEST 1 VIEW   COMPARISON:  12/17/2022 at 1424 hours   FINDINGS: Endotracheal tube terminates 15 mm above the carina.   Mild patchy/interstitial markings, left upper lobe predominant. No pleural effusion or pneumothorax.   Mild cardiomegaly.   Right IJ venous catheter terminates cavoatrial junction.   Enteric tube courses into the distal stomach. Defibrillator pads overlying the left hemithorax.   IMPRESSION: Right IJ venous catheter terminates cavoatrial junction. No pneumothorax.   Endotracheal tube terminates 15 mm above the carina. Additional support apparatus as above.   Mild patchy/interstitial markings, left  upper lobe predominant, suspicious for pneumonia.     Electronically Signed   By: Julian Hy M.D.   On: 12/17/2022 23:59   Assessment/Plan:  71 y.o. female with severe hypothermia, recent CPR, PNA, pancreatitis with cholelithiasis, complicated   -  Nothing to add from surgical perspective; needs stabilization, consider MRI vs follow up CT pancreatic protocol to reassess potential area of necrosis   - Appreciate CC team's management   - IV abx/resus/GI and DVT prophylaxis   -  Will follow.   Ronny Bacon, M.D., [facility]  12/18/2022 ; 10:28 AM

## 2022-12-18 NOTE — Progress Notes (Signed)
*  PRELIMINARY RESULTS* Echocardiogram 2D Echocardiogram has been performed.  Jill Smith 12/18/2022, 12:47 PM

## 2022-12-18 NOTE — Progress Notes (Signed)
0400 reached out to [facility] NP regarding patients temp of  32.9c instructed to gradually room patient and monitor patient's heart rate bair hugger moved from [facility] to [place] Patient heart rate brady to 35 code blue called but canceled. Patient awake and alert on vent with pluse.  Ouma and primary nurse Tess at bedside.

## 2022-12-18 NOTE — ED Provider Notes (Signed)
CODE BLUE note  Called to patient's bedside in the ICU after CODE BLUE was called.  Upon arrival, patient receiving ACLS.  Patient was intubated by me with a 7.5 tube.  I was able to visualize the endotracheal tube passing through the vocal cords with positive color change on CO2 detector as well as positive breath sounds over bilateral lung fields.  During this intubation, ACLS was continued and patient received ROSC soon after.  Care of this patient was handed over to the provider at bedside in the ICU   Naaman Plummer, MD 12/18/22 1708

## 2022-12-18 NOTE — Progress Notes (Signed)
Initial Nutrition Assessment  DOCUMENTATION CODES:   Obesity unspecified  INTERVENTION:   If tube feeds initiated, recommend:  Vital 1.2'@50ml'$ /hr- Initiate at 31m/hr, once tolerating, increase by 135mhr q 8 hours until goal rate is reached.   ProSource TF 20- Give 6023mID via tube, each supplement provides 80kcal and 20g of protein.   Free water flushes 90m24m hours to maintain tube patency   Regimen provides 1600kcal/day, 130g/day protein and 1153ml71m of free water.   Pt at high refeed risk; recommend monitor potassium, magnesium and phosphorus labs daily until stable  Daily weights  Juven Fruit Punch BID via tube, each serving provides 95kcal and 2.5g of protein (amino acids glutamine and arginine)  NUTRITION DIAGNOSIS:   Inadequate oral intake related to inability to eat (pt sedated and ventilated) as evidenced by NPO status.  GOAL:   Provide needs based on ASPEN/SCCM guidelines  MONITOR:   Vent status, Labs, Weight trends, TF tolerance, Skin, I & O's  REASON FOR ASSESSMENT:   Ventilator    ASSESSMENT:   71 y/26female with h/o schizophrenia, depression, bipolar disorder, HTN, CKD and DJD who is admitted with  hypothermia, AMS, sepsis, acute pancreatitis with early peripancreatic necrosis, cholelithiasis and large gallstone in the neck of the gallbladder and, AKI and pneumonia complicated by cardiac arrest. Pt also noted to have new lung nodule.   Pt sedated and ventilated. OGT in place. Pt with acute pancreatitis secondary to large gallstone; pt will need MRCP once she is more stable. RD currently awaiting GI decision on if tube feeds can be started. Will plan to start at trickle rate and advance as tolerated. Per chart, pt appears to be down ~43lbs from her UBW of ~300lbs last measured in January. RD unsure how recently weight loss occurred.   Medications reviewed and include: heparin, protonix, NaCl w/ 5% dextrose '@75ml'$ /hr, levophed, zosyn, vasopressin    Labs reviewed: K 4.9 wnl, BUN 32(H), creat 1.51(H), Ca 10.5(H), P 3.4 wnl, Mg 2.4 wnl  Lipase- 533(H) Cbgs- 98, 79, 82, 126, 35 x 24 hrs  Patient is currently intubated on ventilator support MV: 9.5 L/min Temp (24hrs), Avg:90 F (32.2 C), Min:81.8 F (27.7 C), Max:92.3 F (33.5 C)  Propofol: none   MAP- >65mmH82mUOP- 550ml  43mRITION - FOCUSED PHYSICAL EXAM:  Flowsheet Row Most Recent Value  Orbital Region No depletion  Upper Arm Region No depletion  Thoracic and Lumbar Region No depletion  Buccal Region No depletion  Temple Region No depletion  Clavicle Bone Region No depletion  Clavicle and Acromion Bone Region No depletion  Scapular Bone Region No depletion  Dorsal Hand No depletion  Patellar Region No depletion  Anterior Thigh Region No depletion  Posterior Calf Region No depletion  Edema (RD Assessment) Mild  Hair Reviewed  Eyes Reviewed  Mouth Reviewed  Skin Reviewed  Nails Reviewed   Diet Order:   Diet Order             Diet NPO time specified  Diet effective now                  EDUCATION NEEDS:   No education needs have been identified at this time  Skin:  Skin Assessment: Reviewed RN Assessment (nonhealing full thickness wound to left great toe)  Last BM:  pta  Height:   Ht Readings from Last 1 Encounters:  12/18/22 '5\' 9"'$  (1.753 m)    Weight:   Wt Readings from Last 1 Encounters:  12/18/22 117 kg    Ideal Body Weight:  65.9 kg  BMI:  Body mass index is 38.09 kg/m.  Estimated Nutritional Needs:   Kcal:  1287-1638kcal/day  Protein:  >130g/day  Fluid:  1.7-1.9L/day  Koleen Distance MS, RD, LDN Please refer to [facility] for RD and/or RD on-call/weekend/after hours pager

## 2022-12-18 NOTE — Procedures (Addendum)
Central Venous Catheter Insertion Procedure Note  Jill Smith  [id number]  26-Mar-1951  Date:12/18/22   Time:2:30 AM   Provider Performing:Mandy Fitzwater A Lorely Bubb   Procedure: Insertion of Non-tunneled Central Venous Catheter(36556) with US guidance (78588)   Indication(s) Medication administration and Difficult access  Consent Unable to obtain consent due to emergent nature of procedure.  Anesthesia Topical only with 1% lidocaine   Timeout Verified patient identification, verified procedure, site/side was marked, verified correct patient position, special equipment/implants available, medications/allergies/relevant history reviewed, required imaging and test results available.  Sterile Technique Maximal sterile technique including full sterile barrier drape, hand hygiene, sterile gown, sterile gloves, mask, hair covering, sterile ultrasound probe cover (if used).  Procedure Description Area of catheter insertion was cleaned with chlorhexidine and draped in sterile fashion.  With real-time ultrasound guidance a central venous catheter was placed into the right internal jugular vein. Nonpulsatile blood flow and easy flushing noted in all ports.  The catheter was sutured in place and sterile dressing applied.  Complications/Tolerance None; patient tolerated the procedure well. Chest X-ray is ordered to verify placement for internal jugular or subclavian cannulation.   Chest x-ray is not ordered for femoral cannulation.  EBL Minimal  Specimen(s) None  Jill Falco, DNP, CCRN, FNP-C, AGACNP-BC Acute Care & Family Nurse Practitioner  [facility]  See Amion for personal pager PCCM on call pager [phone] until 7 am

## 2022-12-18 NOTE — Progress Notes (Signed)
During the rewarming period, patient  had another episode of bradycardia followed by transient hypotension. Temp initially 32.9 at 0400, at 0410 temp increased to 33.1. Patient's HR stabilized as soon as rewarming was paused.    EKG  showed wide complex tachycardia   Patient remains of Levophed and low dose propofol and Fentanyl.  Rufina Falco, DNP, CCRN, FNP-C, AGACNP-BC Acute Care & Family Nurse Practitioner  [facility]  See Amion for personal pager PCCM on call pager [phone] until 7 am

## 2022-12-18 NOTE — Progress Notes (Signed)
Eeg done 

## 2022-12-18 NOTE — Consult Note (Signed)
Pharmacy Antibiotic Note  Jill Smith is a 71 y.o. female admitted on 12/17/2022 with AMS and hypothermia. PMH significant for bipolar disorder, depression, HTN, obesity, schizophrenia, tubular adenoma of colon. Pharmacy has been consulted for Zosyn dosing.  Plan: Day 2 of antibiotics Continue Zosyn 3.375 g IV Q8H Continue to monitor renal function and follow culture results   Height: '5\' 9"'$  (175.3 cm) Weight: 117 kg (257 lb 15 oz) IBW/kg (Calculated) : 66.2  Temp (24hrs), Avg:88.2 F (31.2 C), Min:81.7 F (27.6 C), Max:91.9 F (33.3 C)  Recent Labs  Lab 12/17/22 1401 12/17/22 1557 12/18/22 0122 12/18/22 0442  WBC 3.2*  --  8.0  --   CREATININE 0.90  --  1.16*  --   LATICACIDVEN 1.6 1.3 3.1* 2.4*     Estimated Creatinine Clearance: 60.7 mL/min (A) (by C-G formula based on SCr of 1.16 mg/dL (H)).    No Known Allergies  Antimicrobials this admission: 12/18 Zosyn >>   Dose adjustments this admission:  Microbiology results: 12/18 BCx: NG<24H 12/18 UCx: NG final  12/18 MRSA PCR: negative 12/19 Trach Cx: IP  Thank you for allowing pharmacy to be a part of this patient's care.  Gretel Acre, PharmD PGY1 Pharmacy Resident 12/18/2022 12:50 PM

## 2022-12-18 NOTE — Progress Notes (Addendum)
Brief hospital course: 71 y.o female  admitted to ICU on 142/18 with acute metabolic encephalopathy, severe hypothermia (reported heater was nonfunctional at home) and sepsis secondary to pneumonia and acute pancreatitis.      Significant Hospital Events: 12/18: Code Blue initiated for unresponsiveness. On arrival to the bedside CPR was in progress. Per RN, CCM called to notify that patient had desaturated , bradied down to asystole. Patient intubated by EDP for airway protection while CPR in progress  CARDIOPULMONARY RESUSCITATION Initial rhythm: Bradycardia~[place] CPR performance duration: 5 minutes Was defibrillation or cardioversion used ? yes Was external pacer placed ? no Was patient intubated pre/post CPR ? pres Was transvenous pacer placed ? no  Medications Administered Include:      Yes/no Amiodarone no  Atropine no  Calcium x2  Epinephrine x1  Lidocaine no  Magnesium 2 gm  Norepinephrine yes  Phenylephrine no  Sodium bicarbonate x1  Vasopression no   Evaluation Final Status - Was patient successfully resuscitated ? yes If successfully resuscitated - what is current rhythm ? NSR If successfully resuscitated - what is current hemodynamic status ? Unstable requiring vasopressor  EKG:       Assessment & Plan:  Cardiac arrest: initial rhythm Bradycardia~Asystole  likely in the setting of Severe Acute Pancreatitis with Hypothermia Circulatory shock +Septic shock  Hypotension with Lactic acidosis and end-organ damage in the form of acute kidney injury. Noted with very wide complex QRS concerning for toxic metabolic causes (Hypoglycemia, hypo/hyperkalemia, hypomagnesemia), given patient was hemodynamically unstable she was shock initially with 120 then 200 J with no improvement. Patient then was treated with 3 amps of Sodium bicarb, 2g of calcium gluconate and magnesium with immediate narrowing of the QRS and rhythm restoration to NSR. -Serial EKG -Patient too  unstable for re-imaging -Continue vasopressors to maintain MAP > 65 -Start Solucortef -Echocardiogram ordered -Trend troponin, Lactate -Hold betablocker.AV nodal drugs -Consider temporary pacer if no improvement with resolution of Pancreatitis -Cardiology consult -Avoid rapid rewarming -IVFs  Acute hypoxic respiratory failure in the setting of above and probable Aspiration Pneumonia -Wean PEEP and FiO2 for sats greater than 90% -Plateau pressures less than 30 cm H20 -VAP bundle in place -Intermittent chest x-ray & ABG -SBT once parameters met -Ensure adequate pulmonary hygiene  -F/u cultures, trend PCT -Continue current pneumonia coverage  AKI -Correct electrolytes as indicated -BMP q4hrs -Monitor I&O's / urinary output -Ensure adequate renal perfusion -Avoid nephrotoxic agents as able  Hypoglycemia in the setting of Acute Pancreatitis -ICU hypoglycemia  protocol in place -Initiate Q 4 CBG monitoring, target range 140 - 180 -Start D51/2NS      Rufina Falco, DNP, CCRN, FNP-C, AGACNP-BC Acute Care & Family Nurse Practitioner  [facility]  See Amion for personal pager PCCM on call pager [phone] until 7 am

## 2022-12-18 NOTE — Procedures (Signed)
Routine EEG Report  Jill Smith is a 71 y.o. female with a history of cardiac arrest and altered mental status who is undergoing an EEG to evaluate for seizures.  Report: This EEG was acquired with electrodes placed according to the International 10-20 electrode system (including Fp1, Fp2, F3, F4, C3, C4, P3, P4, O1, O2, T3, T4, T5, T6, A1, A2, Fz, Cz, Pz). The following electrodes were missing or displaced: none.  The best background was 4-5 Hz. This activity is reactive to stimulation. There was no sleep architecture identified. There was no focal slowing. There were no interictal epileptiform discharges. There were no electrographic seizures identified. Photic stimulation and hyperventilation were not performed.   Impression and clinical correlation: This EEG was obtained while comatose and is abnormal due to moderate-to-severe diffuse slowing indicative of global cerebral dysfunction. Epileptiform abnormalities were not seen during this recording.  Su Monks, MD [facility] [phone]  If 7pm- 7am, please page neurology on call as listed in [place].

## 2022-12-18 NOTE — Progress Notes (Addendum)
NAME:  Jill Smith, MRN:  [id number], DOB:  May 18, 1951, LOS: 1 ADMISSION DATE:  12/17/2022, CONSULTATION DATE: 12/17/22 REFERRING MD: Dr. Jacqualine Code, CHIEF COMPLAINT: AMS  History of Present Illness:  This is a 71 yo female who presented to [facility] ER on 12/18 from home via EMS with altered mental status and hypothermia.  Per EMS when they arrived at pts home she was non-verbal which is not her baseline, and cold to touch with inability to obtain a temp.  Pts family reported the heater in their home stopped working the night of 12/17, and pt was found in this state the morning of 12/18 prompting EMS notification.    ED Course  Upon arrival to the ER vital signs were: temp 81.8 F rectally/bp 154/87/hr 36/ O2 sats 87% on RA.  Bear hugger applied and pt received 1L of warm saline due to severe hypothermia.  Lab results were: K+ 5.2/calcium 10.4/AST 73/ALT 129/total protein 6.3/wbc 3.2/platelet count 92.  COVID-19/Influenza A&B by PCR negative.    EKG: Sinus Bradycardia; heart rate 34, prolonged pr interval, no ST abnormality    CT Head/Cervical Spine: . No CT evidence for acute intracranial abnormality. Atrophy and chronic small vessel ischemic changes of the white matter. Mild reversal of cervical lordosis with degenerative changes. No acute osseous abnormality. Enlarged appearing prevertebral soft tissues without obvious focal fluid collection allowing for absence of contrast. Somewhat similar appearance on CT from 2011 suggesting that findings could be due to habitus and positioning. If there is further concern for soft tissue injury or infection, further assessment with MRI could be considered. Heterogeneous airspace opacities at the apices/possible pneumonia with left effusion.  CT Abd Pelvis:  Findings of acute pancreatitis with early peripancreatic necrosis in the transverse mesocolon versus developing phlegmon, no organized collection best characterized as interstitial edematous pancreatitis at  this time. Fluid also tracks into the anterior pararenal space. Changes are moderate to marked in terms of inflammation. Cholelithiasis with signs of 8 mm common bile duct. Correlate with any laboratory evidence of biliary obstruction. Large gallstone seen in the neck of the gallbladder as well. MRCP may be helpful for further evaluation as warranted. Small volume ascites and bilateral pleural effusions. Adrenal hyperplasia. Could consider correlation with adrenal function if not yet performed. Patchy ground-glass and small bilateral pleural effusions greatest on the RIGHT. Correlate with any potential for aspiration. Given asymmetry this is considered. Irregular RIGHT lower lobe pulmonary nodule measuring 7 x 6 mm. Non-contrast chest CT at 6-12 months is recommended. If the nodule is stable at time of repeat CT, then future CT at 18-24 months (from today's scan) is considered optional for low-risk patients, but is recommended for high-risk patients. This recommendation follows the consensus statement: Guidelines for Management of Incidental Pulmonary Nodules Detected on CT Images: From the Fleischner Society 2017; Radiology 2017; 284:228-243.   Pertinent  Medical History  Bipolar Disorder Depression HTN Obesity Schizophrenia  Tubular Adenoma of Colon   Significant Hospital Events: Including procedures, antibiotic start and stop dates in addition to other pertinent events   12/18: Pt admitted to ICU with acute metabolic encephalopathy, severe hypothermia (reported heater was nonfunctional at home) and sepsis secondary to pneumonia and acute pancreatitis  12/18: Overnight during rewarming pt became bradycardic and subsequently asystole requiring initiation of ACLS protocol and mechanical intubation prior to [place] 12/19: Pt had another episode of bradycardia followed by transient hypotension requiring continuous levophed gtt   Interim History / Subjective:  Pt opens eyes to voice  but unable to follow  commands or move extremities.  Temp 91.8 F per temp foley.  Remains on levophed gtt with continued bradycardia heart rate 40's   Objective   Blood pressure 133/70, pulse (!) 44, temperature (!) 92.1 F (33.4 C), resp. rate 15, height _0  (1.753 m), weight 117 kg, SpO2 100 %.    Vent Mode: PRVC FiO2 (%):  [28 %-50 %] 28 % Set Rate:  [15 bmp] 15 bmp Vt Set:  [450 mL] 450 mL PEEP:  [5 cmH20] 5 cmH20 Plateau Pressure:  [18 cmH20] 18 cmH20   Intake/Output Summary (Last 24 hours) at 12/18/2022 1309 Last data filed at 12/18/2022 0901 Gross per 24 hour  Intake 3940.38 ml  Output 550 ml  Net 3390.38 ml   Filed Weights   12/18/22 0143  Weight: 117 kg    Examination: General: Acute on chronically-ill appearing female, NAD mechanically intubated  HENT: Supple, no JVD  Lungs: Diminished throughout, even, non labored  Cardiovascular: Sinus bradycardia, s1s2, no r/g, 2+ radial/1+ distal pulses via doppler, no edema  Abdomen: +BS x4, obese, soft, non tender, non distended, yeast present at abdominal folds  Extremities: Normal bulk.  Extremities cool to touch, left great toe ulceration  Neuro: Nonverbal and unable to follow commands, opens eyes to painful stimulation, bilateral pinpoint pupils very sluggish  GU: Temp indwelling foley draining yellow urine   Resolved Hospital Problem list     Assessment & Plan:   Acute respiratory failure secondary to pneumonia  Mechanical intubation  - Full vent support for now: vent settings reviewed and established - Continue lung protective strategies - SBT once all parameters met  - VAP bundle implemented - Intermittent CXR and ABG's   Cardiac arrest: initial rhythm bradycardia~asystole likely in the setting of severe acute pancreatitis with hypothermia  Septic shock  Sinus bradycardia likely secondary to severe hypothermia  - Continuous telemetry monitoring  - Continue maintenance iv fluids and prn levophed/vasopressin gtts to maintain map  >65 - Continue stress dose steroids  - Hold outpatient antihypertensives  - Maintain normothermia - Echo pending   Sepsis secondary to acute pancreatitis with early peripancreatic necrosis and pneumonia  Cholelithiasis and large gallstone in the neck of the gallbladder   - Trend WBC and monitor fever curve  - Trend PCT  - Follow cultures  - Continue zosyn  - GI and General Surgery consulted appreciate input - Once severe hypothermia has resolved will obtain MRCP to further assess gallstone  - Aggressive iv fluid resuscitation   Acute kidney injury secondary to ATN  - Trend BMP  - Replace electrolytes as indicated  - Monitor UOP - Avoid nephrotoxic medications   Elevated liver enzymes and alk phos likely secondary to acute pancreatitis  - Trend hepatic function panel  - Avoid hepatotoxic medications   Hypothermia secondary to sepsis and reported nonfunctioning heater at home - Prn Bair-hugger to maintain normothermia   Thrombocytopenia  - Trend CBC  - Monitor for s/sx of bleeding  - SCD's for VTE px; Will start subq heparin  - Haptoglobin, d-dimer, peripheral smear results pending   Left great toe ulceration present on admission s/p outpatient debridement per podiatry on 12/11/22   Left foot x-ray 12/17/22: large soft tissue ulceration involving the first toe no evidence to suggest osteomyelitis  - Wound care consulted appreciate input   Acute metabolic encephalopathy in the setting of severe hypothermia and sepsis Possible seizure activity   Mechanical intubation discomfort  Hx: Schizophrenia and bipolar  disorder - EEG pending  - Correct metabolic derangements - Maintain RASS goal 0 to -1 - Prn fentanyl to maintain RASS goal; prn ativan for seizure activity  - Provide supportive care  - Hold outpatient seroquel for now   Best Practice (right click and "Reselect all SmartList Selections" daily)   Diet/type: Will contact GI to inquire if appropriate to start trickle  feeds  DVT prophylaxis: SCD and subq heparin  GI prophylaxis: N/A Lines: N/A Foley:  Yes, and it is still needed Code Status:  full code Last date of multidisciplinary goals of care discussion [12/18/22]  12/19: Updated pts sister Gardiner Sleeper at bedside and pts niece via telephone regarding decline in pts condition and current plan of care.  Also confirmed code status Mrs. Charlynn Grimes stated pt is Full Code .  All questions were answered.  Palliative Care consulted to assist with goals of treatment   Labs   CBC: Recent Labs  Lab 12/17/22 1401 12/18/22 0122  WBC 3.2* 8.0  NEUTROABS 2.1  --   HGB 14.8 12.6  HCT 46.4* 38.9  MCV 88.2 86.4  PLT 92* 95*    Basic Metabolic Panel: Recent Labs  Lab 12/17/22 1401 12/18/22 0122 12/18/22 1146  NA 141 144 143  K 5.2* 4.3 4.9  CL 107 111 112*  CO2 _0 GLUCOSE 102* 42* 110*  BUN 27* 28* 32*  CREATININE 0.90 1.16* 1.51*  CALCIUM 10.4* 10.0 10.5*  MG  --  2.3 2.4  PHOS  --  3.5 3.4   GFR: Estimated Creatinine Clearance: 46.7 mL/min (A) (by C-G formula based on SCr of 1.51 mg/dL (H)). Recent Labs  Lab 12/17/22 1401 12/17/22 1557 12/17/22 1558 12/18/22 0122 12/18/22 0442 12/18/22 0836  PROCALCITON  --   --  <0.10 <0.10  --   --   WBC 3.2*  --   --  8.0  --   --   LATICACIDVEN 1.6 1.3  --  3.1* 2.4* 1.5    Liver Function Tests: Recent Labs  Lab 12/17/22 1401 12/18/22 0122  AST 73* 370*  ALT 129* 481*  ALKPHOS 78 77  BILITOT 0.8 1.0  PROT 6.3* 5.4*  ALBUMIN 3.5 2.9*   Recent Labs  Lab 12/17/22 1558 12/18/22 0122  LIPASE 380* 533*   No results for input(s): "AMMONIA" in the last 168 hours.  ABG    Component Value Date/Time   PHART 7.41 12/18/2022 0737   PCO2ART 49 (H) 12/18/2022 0737   PO2ART 105 12/18/2022 0737   HCO3 31.1 (H) 12/18/2022 0737   O2SAT 99.5 12/18/2022 0737     Coagulation Profile: Recent Labs  Lab 12/17/22 1401  INR 1.1    Cardiac Enzymes: Recent Labs  Lab 12/17/22 1401   CKTOTAL 69    HbA1C: Hgb A1c MFr Bld  Date/Time Value Ref Range Status  02/09/2019 04:13 PM 5.9 (H) 4.8 - 5.6 % Final    Comment:    (NOTE) Pre diabetes:          5.7%-6.4% Diabetes:              >6.4% Glycemic control for   <7.0% adults with diabetes     CBG: Recent Labs  Lab 12/18/22 0226 12/18/22 0256 12/18/22 0748 12/18/22 1000 12/18/22 1155  GLUCAP 35* 126* 82 79 98    Review of Systems:   Unable to assess pt nonverbal at this time   Past Medical History:  She,  has a past medical history of  Bipolar disorder ([place]), Depression, Hypertension, Obesity, Schizophrenia ([place]), Shortness of breath dyspnea, and Tubular adenoma of colon.   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 Her smoking use included cigarettes. She has never used smokeless tobacco. She reports that she does not drink alcohol and does not use drugs.   Family History:  Her family history includes Diabetes in her father and mother; Hypertension in her mother; Prostate cancer in her father. There is no history of Breast cancer.   Allergies No Known Allergies   Home Medications  Prior to Admission medications   Medication Sig Start Date End Date Taking? Authorizing Provider  amoxicillin-clavulanate (AUGMENTIN) 875-125 MG tablet Take 1 tablet by mouth 2 (two) times daily. 12/11/22 12/31/22 Yes [provider]  ciprofloxacin (CIPRO) 500 MG tablet Take 500 mg by mouth 2 (two) times daily.  12/11/22 12/31/22 Yes [provider]  metoprolol tartrate (LOPRESSOR) 25 MG tablet Take 25 mg by mouth 2 (two) times daily. 12/06/22  Yes [provider]  [facility] powder Apply 1 Application topically 2 (two) times daily. 12/05/22  Yes [provider]  amLODipine (NORVASC) 5 MG tablet Take 5 mg by mouth daily.    [provider]  aspirin EC 81 MG tablet Take 81 mg by mouth daily.    [provider]  docusate sodium (COLACE) 100 MG capsule Take 100 mg by mouth daily as needed for mild constipation.     [provider]  isosorbide mononitrate (IMDUR) 60 MG 24 hr tablet Take 60 mg by mouth daily.    [provider]  lisinopril (PRINIVIL,ZESTRIL) 20 MG tablet Take 20 mg by mouth daily.    [provider]  metoprolol tartrate (LOPRESSOR) 50 MG tablet Take 50 mg by mouth 2 (two) times daily.     [provider]  paliperidone (INVEGA SUSTENNA) 234 MG/1.5ML SUSY injection Inject 234 mg into the muscle every 30 (thirty) days.    [provider]  QUEtiapine (SEROQUEL) 100 MG tablet Take 100 mg by mouth at bedtime. 06/15/21   [provider]  SIMBRINZA 1-0.2 % SUSP Place 1 drop into the left eye 3 (three) times daily. Patient not taking: Reported on 12/17/2022 02/20/21   [provider]  vitamin E 400 UNIT capsule Take 800 Units by mouth 2 (two) times daily.     [provider]     Critical care time: 51 minutes      Donell Beers, [place] Pager [phone] (please enter 7 digits) PCCM Consult Pager [phone] (please enter 7 digits)

## 2022-12-19 ENCOUNTER — Inpatient Hospital Stay: Payer: Medicare HMO

## 2022-12-19 ENCOUNTER — Encounter: Payer: Self-pay | Admitting: Pulmonary Disease

## 2022-12-19 DIAGNOSIS — A419 Sepsis, unspecified organism: Secondary | ICD-10-CM | POA: Diagnosis not present

## 2022-12-19 DIAGNOSIS — I441 Atrioventricular block, second degree: Secondary | ICD-10-CM | POA: Diagnosis not present

## 2022-12-19 DIAGNOSIS — K851 Biliary acute pancreatitis without necrosis or infection: Secondary | ICD-10-CM | POA: Diagnosis not present

## 2022-12-19 DIAGNOSIS — Z515 Encounter for palliative care: Secondary | ICD-10-CM | POA: Diagnosis not present

## 2022-12-19 DIAGNOSIS — M79A3 Nontraumatic compartment syndrome of abdomen: Secondary | ICD-10-CM

## 2022-12-19 DIAGNOSIS — K859 Acute pancreatitis without necrosis or infection, unspecified: Secondary | ICD-10-CM | POA: Diagnosis not present

## 2022-12-19 DIAGNOSIS — I4729 Other ventricular tachycardia: Secondary | ICD-10-CM

## 2022-12-19 DIAGNOSIS — R6510 Systemic inflammatory response syndrome (SIRS) of non-infectious origin without acute organ dysfunction: Secondary | ICD-10-CM

## 2022-12-19 DIAGNOSIS — Z7189 Other specified counseling: Secondary | ICD-10-CM

## 2022-12-19 DIAGNOSIS — N179 Acute kidney failure, unspecified: Secondary | ICD-10-CM | POA: Diagnosis not present

## 2022-12-19 DIAGNOSIS — T68XXXS Hypothermia, sequela: Secondary | ICD-10-CM | POA: Diagnosis not present

## 2022-12-19 DIAGNOSIS — T68XXXA Hypothermia, initial encounter: Secondary | ICD-10-CM | POA: Diagnosis not present

## 2022-12-19 LAB — CBC
HCT: 37.2 % (ref 36.0–46.0)
Hemoglobin: 11.7 g/dL — ABNORMAL LOW (ref 12.0–15.0)
MCH: 27.8 pg (ref 26.0–34.0)
MCHC: 31.5 g/dL (ref 30.0–36.0)
MCV: 88.4 fL (ref 80.0–100.0)
Platelets: 78 10*3/uL — ABNORMAL LOW (ref 150–400)
RBC: 4.21 MIL/uL (ref 3.87–5.11)
RDW: 14.4 % (ref 11.5–15.5)
WBC: 8.2 10*3/uL (ref 4.0–10.5)
nRBC: 0.6 % — ABNORMAL HIGH (ref 0.0–0.2)

## 2022-12-19 LAB — BLOOD GAS, ARTERIAL
Acid-Base Excess: 3.8 mmol/L — ABNORMAL HIGH (ref 0.0–2.0)
Bicarbonate: 29.2 mmol/L — ABNORMAL HIGH (ref 20.0–28.0)
FIO2: 28 %
MECHVT: 450 mL
Mechanical Rate: 15
O2 Saturation: 98.9 %
PEEP: 5 cmH2O
Patient temperature: 37
pCO2 arterial: 46 mmHg (ref 32–48)
pH, Arterial: 7.41 (ref 7.35–7.45)
pO2, Arterial: 93 mmHg (ref 83–108)

## 2022-12-19 LAB — BASIC METABOLIC PANEL
Anion gap: 4 — ABNORMAL LOW (ref 5–15)
Anion gap: 6 (ref 5–15)
BUN: 38 mg/dL — ABNORMAL HIGH (ref 8–23)
BUN: 42 mg/dL — ABNORMAL HIGH (ref 8–23)
CO2: 26 mmol/L (ref 22–32)
CO2: 25 mmol/L (ref 22–32)
Calcium: 10.1 mg/dL (ref 8.9–10.3)
Calcium: 10.3 mg/dL (ref 8.9–10.3)
Chloride: 112 mmol/L — ABNORMAL HIGH (ref 98–111)
Chloride: 112 mmol/L — ABNORMAL HIGH (ref 98–111)
Creatinine, Ser: 2.18 mg/dL — ABNORMAL HIGH (ref 0.44–1.00)
Creatinine, Ser: 2.35 mg/dL — ABNORMAL HIGH (ref 0.44–1.00)
GFR, Estimated: 24 mL/min — ABNORMAL LOW (ref >60)
GFR, Estimated: 22 mL/min — ABNORMAL LOW (ref >60)
Glucose, Bld: 126 mg/dL — ABNORMAL HIGH (ref 70–99)
Glucose, Bld: 115 mg/dL — ABNORMAL HIGH (ref 70–99)
Potassium: 4.8 mmol/L (ref 3.5–5.1)
Potassium: 4.6 mmol/L (ref 3.5–5.1)
Sodium: 142 mmol/L (ref 135–145)
Sodium: 143 mmol/L (ref 135–145)

## 2022-12-19 LAB — GLUCOSE, CAPILLARY
Glucose-Capillary: 113 mg/dL — ABNORMAL HIGH (ref 70–99)
Glucose-Capillary: 117 mg/dL — ABNORMAL HIGH (ref 70–99)
Glucose-Capillary: 123 mg/dL — ABNORMAL HIGH (ref 70–99)
Glucose-Capillary: 124 mg/dL — ABNORMAL HIGH (ref 70–99)
Glucose-Capillary: 126 mg/dL — ABNORMAL HIGH (ref 70–99)
Glucose-Capillary: 132 mg/dL — ABNORMAL HIGH (ref 70–99)
Glucose-Capillary: 153 mg/dL — ABNORMAL HIGH (ref 70–99)
Glucose-Capillary: 81 mg/dL (ref 70–99)

## 2022-12-19 LAB — PROCALCITONIN: Procalcitonin: 0.11 ng/mL

## 2022-12-19 LAB — MAGNESIUM
Magnesium: 2.3 mg/dL (ref 1.7–2.4)
Magnesium: 2.3 mg/dL (ref 1.7–2.4)

## 2022-12-19 LAB — HEPATIC FUNCTION PANEL
ALT: 333 U/L — ABNORMAL HIGH (ref 0–44)
AST: 172 U/L — ABNORMAL HIGH (ref 15–41)
Albumin: 2.9 g/dL — ABNORMAL LOW (ref 3.5–5.0)
Alkaline Phosphatase: 70 U/L (ref 38–126)
Bilirubin, Direct: 0.1 mg/dL (ref 0.0–0.2)
Indirect Bilirubin: 1 mg/dL — ABNORMAL HIGH (ref 0.3–0.9)
Total Bilirubin: 1.1 mg/dL (ref 0.3–1.2)
Total Protein: 5.3 g/dL — ABNORMAL LOW (ref 6.5–8.1)

## 2022-12-19 LAB — PHOSPHORUS
Phosphorus: 3.7 mg/dL (ref 2.5–4.6)
Phosphorus: 3.7 mg/dL (ref 2.5–4.6)

## 2022-12-19 LAB — LACTIC ACID, PLASMA: Lactic Acid, Venous: 1.1 mmol/L (ref 0.5–1.9)

## 2022-12-19 MED ORDER — ORAL CARE MOUTH RINSE: 15.0000 mL | OROMUCOSAL | Status: DC | PRN

## 2022-12-19 MED ORDER — CHLORHEXIDINE GLUCONATE CLOTH 2 % EX PADS
6.0000 | MEDICATED_PAD | Freq: Every day | CUTANEOUS | Status: DC
  Administered 2022-12-20 – 2022-12-26 (×7): 6 via TOPICAL

## 2022-12-19 MED ORDER — HYDROCORTISONE SOD SUC (PF) 100 MG IJ SOLR
50.0000 mg | Freq: Four times a day (QID) | INTRAMUSCULAR | Status: DC
  Administered 2022-12-19 – 2022-12-20 (×4): 50 mg via INTRAVENOUS
  Filled 2022-12-19 (×4): qty 2

## 2022-12-19 MED ORDER — DEXTROSE IN LACTATED RINGERS 5 % IV SOLN: INTRAVENOUS | Status: DC

## 2022-12-19 MED ORDER — ORAL CARE MOUTH RINSE
15.0000 mL | OROMUCOSAL | Status: DC
  Administered 2022-12-19 – 2022-12-23 (×55): 15 mL via OROMUCOSAL

## 2022-12-19 NOTE — Progress Notes (Signed)
67 Myself and Primary nurse Tess perform the read IAP reading on the patient getting a reading of 42 and 48. Ouma NP made aware orders placed to draw a lactic acid and to draw morning labs early.   0400 orders placed for Stat CT.    While getting patient ready for transport patient had a 27 beat run of V-Tact. NP Ouma informed and NP informed staff not to transfer patient to CT. NP came to bedside to assess patient. Primary nurse Tess also aware

## 2022-12-19 NOTE — Consult Note (Signed)
Pharmacy Antibiotic Note  Jill Smith is a 71 y.o. female admitted on 12/17/2022 with AMS and hypothermia. PMH significant for bipolar disorder, depression, HTN, obesity, schizophrenia, tubular adenoma of colon. Pharmacy has been consulted for Zosyn dosing.  Plan: Day 3 of antibiotics Continue Zosyn 3.375 g IV Q8H Continue to monitor renal function and follow culture results   Height: '5\' 9"'$  (175.3 cm) Weight: 120.1 kg (264 lb 12.4 oz) IBW/kg (Calculated) : 66.2  Temp (24hrs), Avg:93.5 F (34.2 C), Min:87.3 F (30.7 C), Max:96.8 F (36 C)  Recent Labs  Lab 12/17/22 1401 12/17/22 1557 12/18/22 0122 12/18/22 0442 12/18/22 0836 12/18/22 1146 12/18/22 1811 12/19/22 0409  WBC 3.2*  --  8.0  --   --   --   --  8.2  CREATININE 0.90  --  1.16*  --   --  1.51* 1.65* 2.18*  LATICACIDVEN 1.6 1.3 3.1* 2.4* 1.5  --   --  1.1     Estimated Creatinine Clearance: 32.8 mL/min (A) (by C-G formula based on SCr of 2.18 mg/dL (H)).    No Known Allergies  Antimicrobials this admission: 12/18 Zosyn >>   Dose adjustments this admission:  Microbiology results: 12/18 BCx: NG2D 12/18 UCx: NG final  12/18 MRSA PCR: negative 12/19 Trach Cx: rare budding yeast  Thank you for allowing pharmacy to be a part of this patient's care.  Gretel Acre, PharmD PGY1 Pharmacy Resident 12/19/2022 8:21 AM

## 2022-12-19 NOTE — Progress Notes (Signed)
Patient transported to and from CT without complication.  

## 2022-12-19 NOTE — Consult Note (Signed)
Cardiology Consultation   Patient ID: KELIAH HARNED MRN: [id number]; DOB: 09-19-1951  Admit date: 12/17/2022 Date of Consult: 12/19/2022  PCP:  [facility] [place] Prov[facility] Cardiologist:  None        Patient Profile:   Jill Smith is a 71 y.o. female with a hx of hypertension, depression, bipolar disorder, obesity, schizophrenia, and tubular adenoma of the colon, who is being seen 12/19/2022 for the evaluation of dysrhythmia, at the request of Dr. Genia Harold.  History of Present Illness:   Jill Smith is a 71 year old female who presented to the [facility] emergency department via EMS on 12/17/2022 for altered mental status and hypothermia. She was found in her home where it appears the heat was not functioning. According to records the family stated that the heat went out on the night of 12/17 which prompted the EMS notification on 12/17/22. She was admitted to the ICU with acute metabolic encephalopathy, severe hypothermia, and sepsis secondary to PNA and acute pancreatitis. Overnight while she was being rewarmed she became bradycardic and then asystole requiring ACLS protocol and mechanical intubation prior to ROSC. Overnight she had continued episodes of bradycardia and hypotension that required the initiation of levophed drip to stabilize blood pressure. There were several non-sustained V-tach episodes (all less than 30 beats) noted on bedside telemetry this morning with intermittent type II AV block with return to sinus with rates of 84. Patient remains intubated and on pressors currently. There is no sedation. She has been evaluated by general surgery for increased intraabdominal pressures who recommend conservative medical management. GI was consulted who recommended the same conservative management.  Initial vitals: blood pressure 99/83, pulse 36, respirations 14, and temp 81.8  Pertinent labs:potassium 5.2, blood glucose 102, BUN 27, AST 73,  ALT 129, WBC 3.2, respiratory panel negative, HS troponin 5, d-dimer 5.07, lactic acid 3.1 and 2.4  Imaging: Chest X-ray no acute abnormalities; CT head no CT evidence for acute intracranial abnormality, atrophy and chronic small vessel ischemic changes of the white matter; CT abd/pelvis findings of acute pancreatitis with early peripancreatic necrosis in th transverse mesocolon vs developing phlegmon, cholelithiasis, small volume ascites, adrenal hyperplasia  Cardiology was consulted this morning for the various changes the patient has had in her rhythm since being rewarmed.   Past Medical History:  Diagnosis Date   Bipolar disorder ([place]set)    Depression    Hypertension    Obesity    Schizophrenia ([place])    Shortness of breath dyspnea    Tubular adenoma of colon     Past Surgical History:  Procedure Laterality Date   COLONOSCOPY WITH PROPOFOL N/A 01/17/2016   Procedure: COLONOSCOPY WITH PROPOFOL;  Surgeon: Lollie Sails, MD;  Location: [facility] ENDOSCOPY;  Service: Endoscopy;  Laterality: N/A;   COLONOSCOPY WITH PROPOFOL N/A 02/28/2016   Procedure: COLONOSCOPY WITH PROPOFOL;  Surgeon: Lollie Sails, MD;  Location: [facility] ENDOSCOPY;  Service: Endoscopy;  Laterality: N/A;   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 Authorizing Provider  amoxicillin-clavulanate (AUGMENTIN) 875-125 MG tablet Take 1 tablet by mouth 2 (two) times daily. 12/11/22 12/31/22 Yes [provider]  ciprofloxacin (CIPRO) 500 MG tablet Take 500 mg by mouth 2 (two) times daily. 12/11/22 12/31/22 Yes [provider]  metoprolol tartrate (LOPRESSOR) 25 MG tablet Take 25 mg by mouth 2 (two)  times daily. 12/06/22  Yes [provider]  [facility] powder Apply 1 Application topically 2 (two) times daily. 12/05/22  Yes [provider]  amLODipine (NORVASC) 5 MG tablet Take 5 mg by mouth  daily.    [provider]  aspirin EC 81 MG tablet Take 81 mg by mouth daily.    [provider]  docusate sodium (COLACE) 100 MG capsule Take 100 mg by mouth daily as needed for mild constipation.     [provider]  isosorbide mononitrate (IMDUR) 60 MG 24 hr tablet Take 60 mg by mouth daily.    [provider]  lisinopril (PRINIVIL,ZESTRIL) 20 MG tablet Take 20 mg by mouth daily.    [provider]  metoprolol tartrate (LOPRESSOR) 50 MG tablet Take 50 mg by mouth 2 (two) times daily.     [provider]  paliperidone (INVEGA SUSTENNA) 234 MG/1.5ML SUSY injection Inject 234 mg into the muscle every 30 (thirty) days.    [provider]  QUEtiapine (SEROQUEL) 100 MG tablet Take 100 mg by mouth at bedtime. 06/15/21   [provider]  SIMBRINZA 1-0.2 % SUSP Place 1 drop into the left eye 3 (three) times daily. Patient not taking: Reported on 12/17/2022 02/20/21   [provider]  vitamin E 400 UNIT capsule Take 800 Units by mouth 2 (two) times daily.     [provider]    Inpatient Medications: Scheduled Meds:  [START ON 12/20/2022] Chlorhexidine Gluconate Cloth  6 each Topical Q0600   feeding supplement (VITAL AF 1.2 CAL)  1,000 mL Per Tube Q24H   free water  30 mL Per Tube Q4H   heparin injection (subcutaneous)  5,000 Units Subcutaneous Q12H   hydrocortisone sod succinate (SOLU-CORTEF) inj  100 mg Intravenous Q6H   nystatin   Topical TID   mouth rinse  15 mL Mouth Rinse Q2H   pantoprazole (PROTONIX) IV  40 mg Intravenous Daily   Continuous Infusions:  sodium chloride     dextrose 5 % and 0.45% NaCl 75 mL/hr at 12/19/22 1005   norepinephrine (LEVOPHED) Adult infusion Stopped (12/19/22 0805)   piperacillin-tazobactam (ZOSYN)  IV 12.5 mL/hr at 12/19/22 1005   propofol (DIPRIVAN) infusion Stopped (12/18/22 0800)   vasopressin 0.01 Units/min (12/19/22 1005)   PRN Meds: atropine, docusate sodium,  fentaNYL (SUBLIMAZE) injection, mouth rinse, polyethylene glycol  Allergies:   No Known Allergies  Social History:   Social History   Socioeconomic History   Marital status: Legally Separated    Spouse name: Not on file   Number of children: Not on file   Years of education: Not on file   Highest education level: Not on file  Occupational History   Not on file  Tobacco Use   Smoking status: Former    Types: Cigarettes   Smokeless tobacco: Never  Substance and Sexual Activity   Alcohol use: No   Drug use: No   Sexual activity: Not Currently  Other Topics Concern   Not on file  Social History Narrative   Lives at home with her sister.  Walks with a walker at baseline   Social Determinants of Radio broadcast assistant Strain: Not on file  Food Insecurity: Not on file  Transportation Needs: Not on file  Physical Activity: Not on file  Stress: Not on file  Social Connections: Not on file  Intimate Partner Violence: Not on file    Family History:    Family History  Problem Relation  Age of Onset   Diabetes Mother    Hypertension Mother    Diabetes Father    Prostate cancer Father    Breast cancer Neg Hx      ROS:  Unable to obtain due to patient being intubated and unresponsive    Physical Exam/Data:   Vitals:   12/19/22 0830 12/19/22 0900 12/19/22 0930 12/19/22 1000  BP: 105/71 115/75 115/70 115/76  Pulse: 83 81 77 80  Resp: '16 15 16 14  '$ Temp: (!) 96.3 F (35.7 C) (!) 96.3 F (35.7 C) (!) 96.3 F (35.7 C) (!) 96.4 F (35.8 C)  TempSrc:      SpO2: 98% 98% 99% 98%  Weight:      Height:        Intake/Output Summary (Last 24 hours) at 12/19/2022 1013 Last data filed at 12/19/2022 1005 Gross per 24 hour  Intake 2284.53 ml  Output 205 ml  Net 2079.53 ml      12/19/2022    5:00 AM 12/18/2022    1:43 AM 01/04/2022   11:30 AM  Last 3 Weights  Weight (lbs) 264 lb 12.4 oz 257 lb 15 oz 300 lb  Weight (kg) 120.1 kg 117 kg 136.079 kg     Body mass  index is 39.1 kg/m.  General:  Well nourished, well developed, in no acute distress HEENT: normal Neck: no JVD Vascular: No carotid bruits; Distal pulses 2+ bilaterally Cardiac:  normal S1, S2; RRR; no murmur  Lungs:  diminished to auscultation bilaterally, no wheezing, rhonchi or rales, respirations are vent assisted Abd: soft, nontender, distended,  no hepatomegaly  Ext: no edema Musculoskeletal:  No deformities, BUE and BLE strength normal and equal Skin: warm and dry  Neuro:  patient is intubated and unresponsive  Psych:  patient is intubated and unresponsive    EKG:  The EKG was personally reviewed and demonstrates:  sinus rate of 65, first degree AVB, and LVH Telemetry:  Telemetry was personally reviewed and demonstrates:  sinus first degree ABV rate of 84  Relevant CV Studies: TTE 12/18/22 1. Left ventricular ejection fraction, by estimation, is 60 to 65%. The  left ventricle has normal function. The left ventricle has no regional  wall motion abnormalities. There is moderate left ventricular hypertrophy.  Left ventricular diastolic  parameters are consistent with Grade III diastolic dysfunction  (restrictive).   2. Right ventricular systolic function is normal. The right ventricular  size is normal.   3. Left atrial size was moderately dilated.   4. Right atrial size was mild to moderately dilated.   5. The mitral valve is normal in structure. Mild mitral valve  regurgitation. No evidence of mitral stenosis.   6. Tricuspid valve regurgitation is mild to moderate.   7. The aortic valve is calcified. Aortic valve regurgitation is mild to  moderate. Aortic valve sclerosis/calcification is present, without any  evidence of aortic stenosis.   8. The inferior vena cava is normal in size with greater than 50%  respiratory variability, suggesting right atrial pressure of 3 mmHg.   Laboratory Data:  High Sensitivity Troponin:   Recent Labs  Lab 12/17/22 1411 12/18/22 0122  12/18/22 0442 12/18/22 0836  TROPONINIHS 5 13 22* 29*     Chemistry Recent Labs  Lab 12/18/22 1146 12/18/22 1811 12/19/22 0409  NA 143 142 142  K 4.9 4.9 4.8  CL 112* 109 112*  CO2 '27 27 26  '$ GLUCOSE 110* 127* 126*  BUN 32* 35* 38*  CREATININE 1.51*  1.65* 2.18*  CALCIUM 10.5* 10.2 10.1  MG 2.4 2.3 2.3  GFRNONAA 37* 33* 24*  ANIONGAP 4* 6 4*    Recent Labs  Lab 12/17/22 1401 12/18/22 0122 12/19/22 0409  PROT 6.3* 5.4* 5.3*  ALBUMIN 3.5 2.9* 2.9*  AST 73* 370* 172*  ALT 129* 481* 333*  ALKPHOS 78 77 70  BILITOT 0.8 1.0 1.1   Lipids  Recent Labs  Lab 12/18/22 0122  TRIG 41    Hematology Recent Labs  Lab 12/17/22 1401 12/18/22 0122 12/19/22 0409  WBC 3.2* 8.0 8.2  RBC 5.26* 4.50 4.21  HGB 14.8 12.6 11.7*  HCT 46.4* 38.9 37.2  MCV 88.2 86.4 88.4  MCH 28.1 28.0 27.8  MCHC 31.9 32.4 31.5  RDW 13.9 14.1 14.4  PLT 92* 95* 78*   Thyroid  Recent Labs  Lab 12/17/22 1401  TSH 2.884    BNPNo results for input(s): "BNP", "PROBNP" in the last 168 hours.  DDimer  Recent Labs  Lab 12/18/22 0122 12/18/22 1146  DDIMER 5.07* 4.16*     Radiology/Studies:  EEG adult  Result Date: 12/18/2022 Jill Jack, MD     12/18/2022  6:58 PM Routine EEG Report Jill Smith is a 71 y.o. female with a history of cardiac arrest and altered mental status who is undergoing an EEG to evaluate for seizures. Report: This EEG was acquired with electrodes placed according to the International 10-20 electrode system (including Fp1, Fp2, F3, F4, C3, C4, P3, P4, O1, O2, T3, T4, T5, T6, A1, A2, Fz, Cz, Pz). The following electrodes were missing or displaced: none. The best background was 4-5 Hz. This activity is reactive to stimulation. There was no sleep architecture identified. There was no focal slowing. There were no interictal epileptiform discharges. There were no electrographic seizures identified. Photic stimulation and hyperventilation were not performed. Impression and  clinical correlation: This EEG was obtained while comatose and is abnormal due to moderate-to-severe diffuse slowing indicative of global cerebral dysfunction. Epileptiform abnormalities were not seen during this recording. Su Monks, MD [facility] [phone] If 7pm- 7am, please page neurology on call as listed in [place].   ECHOCARDIOGRAM COMPLETE  Result Date: 12/18/2022    ECHOCARDIOGRAM REPORT   Patient Name:   Jill Smith Date of Exam: 12/18/2022 Medical Rec #:  [id number]       Height:       69.0 in Accession #:    [id number]      Weight:       257.9 lb Date of Birth:  1951-02-20       BSA:          2.302 m Patient Age:    43 years        BP:           123/82 mmHg Patient Gender: F               HR:           43 bpm. Exam Location:  [facility] Procedure: 2D Echo, Color Doppler and Cardiac Doppler STAT ECHO Indications:     R06.00 Dyspnea  History:         Patient has no prior history of Echocardiogram examinations.                  Risk Factors:Hypertension.  Sonographer:     Charmayne Sheer Referring Phys:  [id number] [place] Diagnosing Phys: Neoma Laming  Sonographer Comments: Echo performed with patient supine and on  artificial respirator. IMPRESSIONS  1. Left ventricular ejection fraction, by estimation, is 60 to 65%. The left ventricle has normal function. The left ventricle has no regional wall motion abnormalities. There is moderate left ventricular hypertrophy. Left ventricular diastolic parameters are consistent with Grade III diastolic dysfunction (restrictive).  2. Right ventricular systolic function is normal. The right ventricular size is normal.  3. Left atrial size was moderately dilated.  4. Right atrial size was mild to moderately dilated.  5. The mitral valve is normal in structure. Mild mitral valve regurgitation. No evidence of mitral stenosis.  6. Tricuspid valve regurgitation is mild to moderate.  7. The aortic valve is calcified. Aortic valve regurgitation is mild to  moderate. Aortic valve sclerosis/calcification is present, without any evidence of aortic stenosis.  8. The inferior vena cava is normal in size with greater than 50% respiratory variability, suggesting right atrial pressure of 3 mmHg. FINDINGS  Left Ventricle: Left ventricular ejection fraction, by estimation, is 60 to 65%. The left ventricle has normal function. The left ventricle has no regional wall motion abnormalities. The left ventricular internal cavity size was normal in size. There is  moderate left ventricular hypertrophy. Left ventricular diastolic parameters are consistent with Grade III diastolic dysfunction (restrictive). Right Ventricle: The right ventricular size is normal. No increase in right ventricular wall thickness. Right ventricular systolic function is normal. Left Atrium: Left atrial size was moderately dilated. Right Atrium: Right atrial size was mild to moderately dilated. Pericardium: There is no evidence of pericardial effusion. Mitral Valve: The mitral valve is normal in structure. Mild mitral valve regurgitation. No evidence of mitral valve stenosis. Tricuspid Valve: The tricuspid valve is normal in structure. Tricuspid valve regurgitation is mild to moderate. No evidence of tricuspid stenosis. Aortic Valve: The aortic valve is calcified. Aortic valve regurgitation is mild to moderate. Aortic regurgitation PHT measures 805 msec. Aortic valve sclerosis/calcification is present, without any evidence of aortic stenosis. Aortic valve mean gradient measures 3.0 mmHg. Aortic valve peak gradient measures 6.7 mmHg. Aortic valve area, by VTI measures 2.58 cm. Pulmonic Valve: The pulmonic valve was normal in structure. Pulmonic valve regurgitation is mild. No evidence of pulmonic stenosis. Aorta: The aortic root is normal in size and structure. Venous: The inferior vena cava is normal in size with greater than 50% respiratory variability, suggesting right atrial pressure of 3 mmHg. IAS/Shunts:  No atrial level shunt detected by color flow Doppler.  LEFT VENTRICLE PLAX 2D LVIDd:         4.30 cm   Diastology LVIDs:         2.90 cm   LV e' medial:    4.24 cm/s LV PW:         1.40 cm   LV E/e' medial:  20.8 LV IVS:        1.30 cm   LV e' lateral:   5.87 cm/s LVOT diam:     2.00 cm   LV E/e' lateral: 15.0 LV SV:         73 LV SV Index:   32 LVOT Area:     3.14 cm  RIGHT VENTRICLE RV Basal diam:  3.30 cm TAPSE (M-mode): 2.6 cm LEFT ATRIUM             Index        RIGHT ATRIUM           Index LA diam:        4.10 cm 1.78 cm/m   RA Area:  14.20 cm LA Vol (A2C):   62.8 ml 27.28 ml/m  RA Volume:   32.70 ml  14.21 ml/m LA Vol (A4C):   44.4 ml 19.29 ml/m LA Biplane Vol: 53.5 ml 23.24 ml/m  AORTIC VALVE                    PULMONIC VALVE AV Area (Vmax):    2.46 cm     PV Vmax:       0.97 m/s AV Area (Vmean):   2.24 cm     PV Vmean:      70.300 cm/s AV Area (VTI):     2.58 cm     PV VTI:        0.255 m AV Vmax:           129.00 cm/s  PV Peak grad:  3.7 mmHg AV Vmean:          86.000 cm/s  PV Mean grad:  2.0 mmHg AV VTI:            0.284 m AV Peak Grad:      6.7 mmHg AV Mean Grad:      3.0 mmHg LVOT Vmax:         101.00 cm/s LVOT Vmean:        61.400 cm/s LVOT VTI:          0.233 m LVOT/AV VTI ratio: 0.82 AI PHT:            805 msec  AORTA Ao Root diam: 3.40 cm MITRAL VALVE MV Area (PHT): 3.05 cm     SHUNTS MV Decel Time: 249 msec     Systemic VTI:  0.23 m MV E velocity: 88.00 cm/s   Systemic Diam: 2.00 cm MV A velocity: 107.00 cm/s MV E/A ratio:  0.82 Shaukat Khan Electronically signed by Neoma Laming Signature Date/Time: 12/18/2022/1:59:11 PM    Final    US Venous Img Lower Bilateral (DVT)  Result Date: 12/18/2022 CLINICAL DATA:  Elevated D-dimer.  Evaluate for DVT. EXAM: BILATERAL LOWER EXTREMITY VENOUS DOPPLER ULTRASOUND TECHNIQUE: Gray-scale sonography with graded compression, as well as color Doppler and duplex ultrasound were performed to evaluate the lower extremity deep venous systems from the  level of the common femoral vein and including the common femoral, femoral, profunda femoral, popliteal and calf veins including the posterior tibial, peroneal and gastrocnemius veins when visible. The superficial great saphenous vein was also interrogated. Spectral Doppler was utilized to evaluate flow at rest and with distal augmentation maneuvers in the common femoral, femoral and popliteal veins. COMPARISON:  None Available. FINDINGS: RIGHT LOWER EXTREMITY Common Femoral Vein: No evidence of thrombus. Normal compressibility, respiratory phasicity and response to augmentation. Saphenofemoral Junction: No evidence of thrombus. Normal compressibility and flow on color Doppler imaging. Profunda Femoral Vein: No evidence of thrombus. Normal compressibility and flow on color Doppler imaging. Femoral Vein: No evidence of thrombus. Normal compressibility, respiratory phasicity and response to augmentation. Popliteal Vein: No evidence of thrombus. Normal compressibility, respiratory phasicity and response to augmentation. Calf Veins: No evidence of thrombus. Normal compressibility and flow on color Doppler imaging. Superficial Great Saphenous Vein: No evidence of thrombus. Normal compressibility. Venous Reflux:  None. Other Findings:  None. LEFT LOWER EXTREMITY Common Femoral Vein: No evidence of thrombus. Normal compressibility, respiratory phasicity and response to augmentation. Saphenofemoral Junction: No evidence of thrombus. Normal compressibility and flow on color Doppler imaging. Profunda Femoral Vein: No evidence of thrombus. Normal compressibility and flow on color Doppler imaging.  Femoral Vein: No evidence of thrombus. Normal compressibility, respiratory phasicity and response to augmentation. Popliteal Vein: No evidence of thrombus. Normal compressibility, respiratory phasicity and response to augmentation. Calf Veins: No evidence of thrombus. Normal compressibility and flow on color Doppler imaging.  Superficial Great Saphenous Vein: No evidence of thrombus. Normal compressibility. Venous Reflux:  None. Other Findings:  None. IMPRESSION: No evidence of deep venous thrombosis in either lower extremity. Electronically Signed   By: Jacqulynn Cadet M.D.   On: 12/18/2022 11:02   DG Abd 1 View  Result Date: 12/17/2022 CLINICAL DATA:  NG tube placement EXAM: ABDOMEN - 1 VIEW COMPARISON:  None Available. FINDINGS: Enteric tube terminates in the gastric antrum. IMPRESSION: Enteric tube terminates in the gastric antrum. Electronically Signed   By: Julian Hy M.D.   On: 12/17/2022 23:59   DG Chest Port 1 View  Result Date: 12/17/2022 CLINICAL DATA:  Central line placement EXAM: PORTABLE CHEST 1 VIEW COMPARISON:  12/17/2022 at 1424 hours FINDINGS: Endotracheal tube terminates 15 mm above the carina. Mild patchy/interstitial markings, left upper lobe predominant. No pleural effusion or pneumothorax. Mild cardiomegaly. Right IJ venous catheter terminates cavoatrial junction. Enteric tube courses into the distal stomach. Defibrillator pads overlying the left hemithorax. IMPRESSION: Right IJ venous catheter terminates cavoatrial junction. No pneumothorax. Endotracheal tube terminates 15 mm above the carina. Additional support apparatus as above. Mild patchy/interstitial markings, left upper lobe predominant, suspicious for pneumonia. Electronically Signed   By: Julian Hy M.D.   On: 12/17/2022 23:59   DG Foot 2 Views Left  Result Date: 12/17/2022 CLINICAL DATA:  Left great toe ulcer. EXAM: LEFT FOOT - 2 VIEW COMPARISON:  None Available. FINDINGS: There is no evidence of fracture or dislocation. There is no evidence of arthropathy or other focal bone abnormality. Large ulceration is vulva in the plantar aspect of the soft tissues of the first toe. IMPRESSION: Large soft tissue ulceration involving the first toe is noted. No definite lytic destruction is seen to suggest osteomyelitis. Electronically  Signed   By: Marijo Conception M.D.   On: 12/17/2022 17:10   CT Head Wo Contrast  Result Date: 12/17/2022 CLINICAL DATA:  Altered mental status EXAM: CT HEAD WITHOUT CONTRAST CT CERVICAL SPINE WITHOUT CONTRAST TECHNIQUE: Multidetector CT imaging of the head and cervical spine was performed following the standard protocol without intravenous contrast. Multiplanar CT image reconstructions of the cervical spine were also generated. RADIATION DOSE REDUCTION: This exam was performed according to the departmental dose-optimization program which includes automated exposure control, adjustment of the mA and/or kV according to patient size and/or use of iterative reconstruction technique. COMPARISON:  CT brain 01/04/2022, CT cervical spine 07/01/2010 FINDINGS: CT HEAD FINDINGS Brain: No acute territorial infarction, hemorrhage, or intracranial mass. Mild atrophy. Patchy white matter hypodensity consistent with chronic small vessel ischemic change. Stable ventricle size. 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 Vascular: No hyperdense vessels. Vertebral and carotid vascular calcification Skull: Normal. Negative for fracture or focal lesion. Sinuses/Orbits: No acute finding. Other: None CT CERVICAL SPINE FINDINGS Alignment: Mild reversal of cervical lordosis. No subluxation. Facet alignment within normal limits Skull base and vertebrae: No acute fracture. No primary bone lesion or focal pathologic process. Soft tissues and spinal canal: Thickened appearance of the prevertebral soft tissues but somewhat similar appearance compared with 2011. Disc levels: Multilevel degenerative change. Moderate disc space narrowing C3 through C7 with prominent anterior osteophytes C4 through C7. Facet degenerative changes at multiple levels. Upper chest: Heterogeneous airspace  opacities at the apices with left effusion. Subcentimeter nodule in the right lobe of thyroid, no further imaging evaluation is recommended.  Other: None IMPRESSION: 1. No CT evidence for acute intracranial abnormality. Atrophy and chronic small vessel ischemic changes of the white matter. 2. Mild reversal of cervical lordosis with degenerative changes. No acute osseous abnormality. 3. Enlarged appearing prevertebral soft tissues without obvious focal fluid collection allowing for absence of contrast. Somewhat similar appearance on CT from 2011 suggesting that findings could be due to habitus and positioning. If there is further concern for soft tissue injury or infection, further assessment with MRI could be considered. 4. Heterogeneous airspace opacities at the apices/possible pneumonia with left effusion. Electronically Signed   By: Donavan Foil M.D.   On: 12/17/2022 15:37   CT Cervical Spine Wo Contrast  Result Date: 12/17/2022 CLINICAL DATA:  Altered mental status EXAM: CT HEAD WITHOUT CONTRAST CT CERVICAL SPINE WITHOUT CONTRAST TECHNIQUE: Multidetector CT imaging of the head and cervical spine was performed following the standard protocol without intravenous contrast. Multiplanar CT image reconstructions of the cervical spine were also generated. RADIATION DOSE REDUCTION: This exam was performed according to the departmental dose-optimization program which includes automated exposure control, adjustment of the mA and/or kV according to patient size and/or use of iterative reconstruction technique. COMPARISON:  CT brain 01/04/2022, CT cervical spine 07/01/2010 FINDINGS: CT HEAD FINDINGS Brain: No acute territorial infarction, hemorrhage, or intracranial mass. Mild atrophy. Patchy white matter hypodensity consistent with chronic small vessel ischemic change. Stable ventricle size. 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 Vascular: No hyperdense vessels. Vertebral and carotid vascular calcification Skull: Normal. Negative for fracture or focal lesion. Sinuses/Orbits: No acute finding. Other: None CT CERVICAL SPINE  FINDINGS Alignment: Mild reversal of cervical lordosis. No subluxation. Facet alignment within normal limits Skull base and vertebrae: No acute fracture. No primary bone lesion or focal pathologic process. Soft tissues and spinal canal: Thickened appearance of the prevertebral soft tissues but somewhat similar appearance compared with 2011. Disc levels: Multilevel degenerative change. Moderate disc space narrowing C3 through C7 with prominent anterior osteophytes C4 through C7. Facet degenerative changes at multiple levels. Upper chest: Heterogeneous airspace opacities at the apices with left effusion. Subcentimeter nodule in the right lobe of thyroid, no further imaging evaluation is recommended. Other: None IMPRESSION: 1. No CT evidence for acute intracranial abnormality. Atrophy and chronic small vessel ischemic changes of the white matter. 2. Mild reversal of cervical lordosis with degenerative changes. No acute osseous abnormality. 3. Enlarged appearing prevertebral soft tissues without obvious focal fluid collection allowing for absence of contrast. Somewhat similar appearance on CT from 2011 suggesting that findings could be due to habitus and positioning. If there is further concern for soft tissue injury or infection, further assessment with MRI could be considered. 4. Heterogeneous airspace opacities at the apices/possible pneumonia with left effusion. Electronically Signed   By: Donavan Foil M.D.   On: 12/17/2022 15:37   CT ABDOMEN PELVIS WO CONTRAST  Result Date: 12/17/2022 CLINICAL DATA:  A 71 year old presents for evaluation of altered mental status. Reported hypothermia. EXAM: CT ABDOMEN AND PELVIS WITHOUT CONTRAST TECHNIQUE: Multidetector CT imaging of the abdomen and pelvis was performed following the standard protocol without IV contrast. RADIATION DOSE REDUCTION: This exam was performed according to the departmental dose-optimization program which includes automated exposure control,  adjustment of the mA and/or kV according to patient size and/or use of iterative reconstruction technique. COMPARISON:  None available. FINDINGS: Lower chest:  Basilar atelectasis. Patchy ground-glass and small bilateral pleural effusions greatest on the RIGHT. Heart size is enlarged. No gross chest wall abnormality. Irregular RIGHT lower lobe pulmonary nodule measuring 7 x 6 mm (image 19/4) Hepatobiliary: Ascites adjacent to the liver. Fissural widening of hepatic fissures. Cholelithiasis. No gross biliary duct dilation or focal hepatic lesion on noncontrast imaging. Pancreas: Peripancreatic stranding worse in the pancreatic-duodenal groove. No visible focal fluid collection though amorphous stranding tracks into the transverse mesocolon. Stranding is moderate to marked. Amorphous fluid density in the transverse mesocolon and or lesser sac (image 39/2) 6 x 2.4 cm measuring 20 for to 28 Hounsfield units Spleen: Unremarkable aside from trace perisplenic fluid. Adrenals/Urinary Tract: Stranding adjacent to the LEFT adrenal gland. Mild stranding adjacent to RIGHT adrenal gland. Adreniform thickening of bilateral adrenal glands. Mild perinephric stranding. No hydronephrosis. No perivesical stranding. Stomach/Bowel: Small bowel without dilation or inflammation. Stranding in the central abdominal mesenteries and transverse mesocolon in the setting of presumed pancreatitis. Normal appendix. Rectal temperature probe in place. Mild thickening of the splenic flexure and transverse colon likely secondary to pancreatic inflammation Vascular/Lymphatic: Aortic atherosclerosis. No sign of aneurysm. Smooth contour of the IVC. There is no gastrohepatic or hepatoduodenal ligament lymphadenopathy. No retroperitoneal or mesenteric lymphadenopathy. No pelvic sidewall lymphadenopathy. Reproductive: Unremarkable by CT. Other: Large abdominal wall pannus. This is incompletely assessed but no gross stranding or skin thickening in imaged  portions. Small volume ascites. No pelvic ascites currently. Musculoskeletal: No acute bone finding. No destructive bone process. Spinal degenerative changes. IMPRESSION: 1. Findings of acute pancreatitis with early peripancreatic necrosis in the transverse mesocolon versus developing phlegmon, no organized collection best characterized as interstitial edematous pancreatitis at this time. Fluid also tracks into the anterior pararenal space. Changes are moderate to marked in terms of inflammation. 2. Cholelithiasis with signs of 8 mm common bile duct. Correlate with any laboratory evidence of biliary obstruction. Large gallstone seen in the neck of the gallbladder as well. MRCP may be helpful for further evaluation as warranted. 3. Small volume ascites and bilateral pleural effusions. 4. Adrenal hyperplasia. Could consider correlation with adrenal function if not yet performed. 5. Patchy ground-glass and small bilateral pleural effusions greatest on the RIGHT. Correlate with any potential for aspiration. Given asymmetry this is considered. 6. Irregular RIGHT lower lobe pulmonary nodule measuring 7 x 6 mm. Non-contrast chest CT at 6-12 months is recommended. If the nodule is stable at time of repeat CT, then future CT at 18-24 months (from today's scan) is considered optional for low-risk patients, but is recommended for high-risk patients. This recommendation follows the consensus statement: Guidelines for Management of Incidental Pulmonary Nodules Detected on CT Images: From the Fleischner Society 2017; Radiology 2017; 284:228-243. Aortic Atherosclerosis (ICD10-I70.0). Above findings related to potential pancreatitis were related to the provider is outlined below. These results were called by telephone at the time of interpretation on 12/17/2022 at 3:35 pm to provider MARK QUALE , who verbally acknowledged these results. Electronically Signed   By: Zetta Bills M.D.   On: 12/17/2022 15:36   DG Chest Port 1  View  Result Date: 12/17/2022 CLINICAL DATA:  Questionable sepsis, nonverbal, not baseline EXAM: PORTABLE CHEST 1 VIEW COMPARISON:  Portable exam 1422 hours compared to 02/09/2019 FINDINGS: External pacing leads project over chest. Rotated to the RIGHT. Normal heart size and mediastinal contours. Lungs grossly clear. No definite infiltrate, pleural effusion, or pneumothorax. IMPRESSION: No definite acute abnormalities. Electronically Signed   By: Lavonia Dana M.D.   On:  12/17/2022 14:43     Assessment and Plan:   Dysrhythmia s/p cardiac arrest -during rewarming from severe hypothermia and acute pancreatitis patient developed bradycardia then asystole which required ACLS protocol and intubation -after ROSC patient returned to sinus but then dropped back into bradycardia and became hypotensive -levophed had to be started to maintain blood pressure -this morning telemetry reveals NSVT with runs less than 30 beats - Mg 2.3 and potassium 4.8 -recommend keeping Mg greater than 2 and potassium greater than 4 -place on pacing pads for episodes of bradycardia -avoid AV nodal blocking agents -echocardiogram revealed LVEF 60-65%, no RWMA, mild MR and mild to moderate TR -if she recovers from current encephalopathy, respiratory failure, and PNA would recommend EP evaluation -continue cardiac monitoring  Acute hypoxic respiratory failure secondary to PNA -currently remains intubated -vent management per CCM -continue to treat PNA -supportive care  Metabolic Encephalopathy in the setting of severe hypothermia and sepsis -remains unresponsive -neurology following -currently on no sedation -PRN medications for possible seizure activity -EEG completed  -supportive care  Sepsis secondary to acute pancreatitis -continued on antibiotics -trend WBC, fever, and cultures -continued on IVF  -followed by surgery and GI with both recommending conservative therapy  Acute kidney injury -monitor urine  output -daily bmp -serum creatinine 2.18 -baseline serum creatinine 0.85-0.90 -monitor/trend/replete electrolytes as needed -avoid nephrotoxic medications where able   Risk Assessment/Risk Scores:                For questions or updates, please contact [place] Please consult [email or web address] for contact info under    Signed, Makinzey Banes, NP  12/19/2022 10:13 AM

## 2022-12-19 NOTE — Progress Notes (Signed)
[place] Hospital Day(s): 2.   Interval History:  Patient seen and examined Remains intubated On 2 mcg Norepinephrine and 0.02 units vasopressin  Without leukocytosis; 8.2K Hgb to 11.7 Renal function worsening; sCr - 2.18; UO - 130 ccs No significant electrolyte derangements Lactic acid level normal at 1.1 Continues on Zosyn NPO  This morning, ICU monitoring intra-abdominal pressures and have multiple readings >40 mmHg   Vital signs in last 24 hours: [min-max] current  Temp:  [87.3 F (30.7 C)-96.8 F (36 C)] 96.4 F (35.8 C) (12/20 0600) Pulse Rate:  [44-95] 76 (12/20 0600) Resp:  [12-23] 16 (12/20 0600) BP: (74-190)/(47-163) 104/80 (12/20 0600) SpO2:  [95 %-100 %] 95 % (12/20 0600) FiO2 (%):  [28 %] 28 % (12/20 0236) Weight:  [120.1 kg] 120.1 kg (12/20 0500)     Height: '5\' 9"'$  (175.3 cm) Weight: 120.1 kg BMI (Calculated): 39.08   Intake/Output last 2 shifts:  12/19 0701 - 12/20 0700 In: 1095.4 [I.V.:1043.5; IV Piggyback:51.9] Out: 130 [Urine:130]   Physical Exam:  Constitutional: Intubated; sedated Respiratory: On Ventilator  Cardiovascular: regular rate and sinus rhythm on monitor Gastrointestinal: Abdominal examination is limited given intubated and sedation as well as by body habitus. Abdomen is morbidly obese however, with deep palpation her abdomen is grossly soft and fascia does not feel distended nor tense.  Genitourinary: Foley in place  Labs:     Latest Ref Rng & Units 12/19/2022    4:09 AM 12/18/2022    1:22 AM 12/17/2022    2:01 PM  CBC  WBC 4.0 - 10.5 K/uL 8.2  8.0  3.2   Hemoglobin 12.0 - 15.0 g/dL 11.7  12.6  14.8   Hematocrit 36.0 - 46.0 % 37.2  38.9  46.4   Platelets 150 - 400 K/uL 78  95  92       Latest Ref Rng & Units 12/19/2022    4:09 AM 12/18/2022    6:11 PM 12/18/2022   11:46 AM  CMP  Glucose 70 - 99 mg/dL 126  127  110   BUN 8 - 23 mg/dL 38  35  32   Creatinine 0.44 - 1.00 mg/dL 2.18   1.65  1.51   Sodium 135 - 145 mmol/L 142  142  143   Potassium 3.5 - 5.1 mmol/L 4.8  4.9  4.9   Chloride 98 - 111 mmol/L 112  109  112   CO2 22 - 32 mmol/L '26  27  27   '$ Calcium 8.9 - 10.3 mg/dL 10.1  10.2  10.5   Total Protein 6.5 - 8.1 g/dL 5.3     Total Bilirubin 0.3 - 1.2 mg/dL 1.1     Alkaline Phos 38 - 126 U/L 70     AST 15 - 41 U/L 172     ALT 0 - 44 U/L 333        Imaging studies: No new pertinent imaging studies   Assessment/Plan: (ICD-10's: K85.90) 71 y.o. female with severe pancreatitis with possible necrosis, cholelithiasis complicated by hypothermia, asystolic event requiring CPR with ROSC, PNA.   - Elevated IAP reading from ICU team noted this morning with worsening AKI concerning for possible abdominal compartment syndrome. However, my suspicion for this is low as her abdomen examination, although limited by body habitus, revealed a soft, non-distended abdomen and she is not on any paralytics. Aside from AKI, her laboratory results are reassuring and she is without leukocytosis nor lactic acidosis. Hemodynamics are  stable with minimal vasopressor support. Will attempt to get STAT CT Abdomen/Pelvis this morning if stable enough for transport. Laparotomy in this setting would likely carry significant morbidity and mortality.    - Continue supportive care per PCCM; appreciate their effort    - Appreciate GI input   - Palliative care involvement may be reasonable given severity of her condition and overall poor prognosis  - We will continue to follow along    All of the above findings and recommendations were discussed with the medical team. No family at bedside.   -- Edison Simon, PA-C [facility] 12/19/2022, 7:13 AM M-F: 7am - 4pm

## 2022-12-19 NOTE — Consult Note (Signed)
Consultation Note Date: 12/19/2022   Patient Name: Jill Smith  DOB: [birth year]  MRN: [id number]  Age / Sex: 71 y.o., female  PCP: [facility] Referring Physician: Armando Reichert, MD  Reason for Consultation: Establishing goals of care  HPI/Patient Profile: 71 y.o. female  with past medical history of morbid obesity, schizophrenia, HTN, bipolar disorder/depression, tubular adenoma of colon, admitted on 12/17/2022 with sepsis secondary to acute pancreatitis, sinus bradycardia likely secondary to severe hypothermia, cardiopulmonary arrest now ventilated..   Clinical Assessment and Goals of Care: I have reviewed medical records including EPIC notes, labs and imaging, received report from RN, assessed the patient.  Jill Smith is lying quietly in bed.  She appears acutely/chronically ill and frail, obese.  She is not sedated, but does not respond in any meaningful way to voice or touch.  There is no family at bedside at this time.  She is being followed by surgery and cardiology.  Call to sister, Jill Smith, to discuss diagnosis prognosis, GOC, EOL wishes, disposition and options.  I introduced Palliative Medicine as specialized medical care for people living with serious illness. It focuses on providing relief from the symptoms and stress of a serious illness. The goal is to improve quality of life for both the patient and the family.  Jill Smith states that she also goes by Jill Smith.  We discussed a brief life review of the patient.  Jill Smith was married, but has not seen her former husband in 35 years.  Jill Smith is unsure if they ever divorced, only that she has not seen him in 34 years, does not know where he went.  She tells me that Jill Smith never had children.  Their parents are deceased.  Jill Smith states that she and Jill Smith have lived together for the last 55 years.  We then focused  on their current illness.  Jill Smith shares what she has heard from the physicians.  She talks about time for outcomes.  She talks about gallbladder disorder and hoping that as this resolves Jill Smith will improve.  We talk about time for outcomes, more information gathering and treatment.  I share that over the next few days to weeks will see how Jill Smith is able to recover.  The natural disease trajectory and expectations at EOL were discussed.  Advanced directives, concepts specific to code status, artifical feeding and hydration, and rehospitalization were considered and discussed.  We talked about Jill Smith's arrest, resuscitation, ventilator support.  Her sister Jill Smith/Jill Smith states that she would want to continue attempting resuscitation.  Discussed the importance of continued conversation with family and the medical providers regarding overall plan of care and treatment options, ensuring decisions are within the context of the patient's values and GOCs.  Questions and concerns were addressed.  The family was encouraged to call with questions or concerns.  PMT will continue to support holistically.  Conference with attending, bedside nursing staff, transition of care team related to patient condition, needs, goals of care, disposition.   HCPOA  NEXT OF KIN -Sister, Jill Smith.  Jill Smith is unmarried, has no children, her parents are deceased.    SUMMARY OF RECOMMENDATIONS   Continue full scope/full code Time for outcomes PMT to follow for declines    Code Status/Advance Care Planning: Full code  Symptom Management:  Per CCM, no additional needs at this time.  Palliative Prophylaxis:  Oral Care and Turn Reposition  Additional Recommendations (Limitations, Scope, Preferences): Full Scope Treatment  Psycho-social/Spiritual:  Desire for further Chaplaincy support:no Additional Recommendations: Caregiving  Support/Resources and ICU Family Guide  Prognosis:  Unable  to determine, guarded at this point.  Discharge Planning: To Be Determined      Primary Diagnoses: Present on Admission:  Hypothermia   I have reviewed the medical record, interviewed the patient and family, and examined the patient. The following aspects are pertinent.  Past Medical History:  Diagnosis Date   Bipolar disorder ([place])    Depression    Hypertension    Obesity    Schizophrenia ([place])    Shortness of breath dyspnea    Tubular adenoma of colon    Social History   Socioeconomic History   Marital status: Legally Separated    Spouse name: Not on file   Number of children: Not on file   Years of education: Not on file   Highest education level: Not on file  Occupational History   Not on file  Tobacco Use   Smoking status: Former    Types: Cigarettes   Smokeless tobacco: Never  Substance and Sexual Activity   Alcohol use: No   Drug use: No   Sexual activity: Not Currently  Other Topics Concern   Not on file  Social History Narrative   Lives at home with her sister.  Walks with a walker at baseline   Social Determinants of Radio broadcast assistant Strain: Not on file  Food Insecurity: Not on file  Transportation Needs: Not on file  Physical Activity: Not on file  Stress: Not on file  Social Connections: Not on file   Family History  Problem Relation Age of Onset   Diabetes Mother    Hypertension Mother    Diabetes Father    Prostate cancer Father    Breast cancer Neg Hx    Scheduled Meds:  [START ON 12/20/2022] Chlorhexidine Gluconate Cloth  6 each Topical Q0600   feeding supplement (VITAL AF 1.2 CAL)  1,000 mL Per Tube Q24H   free water  30 mL Per Tube Q4H   heparin injection (subcutaneous)  5,000 Units Subcutaneous Q12H   hydrocortisone sod succinate (SOLU-CORTEF) inj  50 mg Intravenous Q6H   nystatin   Topical TID   mouth rinse  15 mL Mouth Rinse Q2H   pantoprazole (PROTONIX) IV  40 mg Intravenous Daily   Continuous Infusions:   sodium chloride     dextrose 5% lactated ringers 75 mL/hr at 12/19/22 1100   norepinephrine (LEVOPHED) Adult infusion 4 mcg/min (12/19/22 1155)   piperacillin-tazobactam (ZOSYN)  IV 12.5 mL/hr at 12/19/22 1100   vasopressin Stopped (12/19/22 1038)   PRN Meds:.atropine, docusate sodium, fentaNYL (SUBLIMAZE) injection, mouth rinse, polyethylene glycol Medications Prior to Admission:  Prior to Admission medications   Medication Sig Start Date End Date Taking? Authorizing Provider  amoxicillin-clavulanate (AUGMENTIN) 875-125 MG tablet Take 1 tablet by mouth 2 (two) times daily. 12/11/22 12/31/22 Yes [provider]  ciprofloxacin (CIPRO) 500 MG tablet Take 500 mg by mouth 2 (two) times daily. 12/11/22 12/31/22 Yes  [provider]  metoprolol tartrate (LOPRESSOR) 25 MG tablet Take 25 mg by mouth 2 (two) times daily. 12/06/22  Yes [provider]  [facility] powder Apply 1 Application topically 2 (two) times daily. 12/05/22  Yes [provider]  amLODipine (NORVASC) 5 MG tablet Take 5 mg by mouth daily.    [provider]  aspirin EC 81 MG tablet Take 81 mg by mouth daily.    [provider]  docusate sodium (COLACE) 100 MG capsule Take 100 mg by mouth daily as needed for mild constipation.     [provider]  isosorbide mononitrate (IMDUR) 60 MG 24 hr tablet Take 60 mg by mouth daily.    [provider]  lisinopril (PRINIVIL,ZESTRIL) 20 MG tablet Take 20 mg by mouth daily.    [provider]  metoprolol tartrate (LOPRESSOR) 50 MG tablet Take 50 mg by mouth 2 (two) times daily.     [provider]  paliperidone (INVEGA SUSTENNA) 234 MG/1.5ML SUSY injection Inject 234 mg into the muscle every 30 (thirty) days.    [provider]  QUEtiapine (SEROQUEL) 100 MG tablet Take 100 mg by mouth at bedtime. 06/15/21   [provider]  SIMBRINZA 1-0.2 % SUSP Place 1 drop into the left eye 3 (three) times  daily. Patient not taking: Reported on 12/17/2022 02/20/21   [provider]  vitamin E 400 UNIT capsule Take 800 Units by mouth 2 (two) times daily.     [provider]   No Known Allergies Review of Systems  Unable to perform ROS: Intubated    Physical Exam Vitals and nursing note reviewed.  Constitutional:      General: She is not in acute distress.    Appearance: She is obese. She is ill-appearing.     Vital Signs: BP 98/62   Pulse 88   Temp (!) 96.8 F (36 C)   Resp 15   Ht '5\' 9"'$  (1.753 m)   Wt 120.1 kg   SpO2 97%   BMI 39.10 kg/m  Pain Scale: CPOT   Pain Score: Asleep   SpO2: SpO2: 97 % O2 Device:SpO2: 97 % O2 Flow Rate: .O2 Flow Rate (L/min): 2 L/min  IO: Intake/output summary:  Intake/Output Summary (Last 24 hours) at 12/19/2022 1345 Last data filed at 12/19/2022 1157 Gross per 24 hour  Intake 2058.54 ml  Output 255 ml  Net 1803.54 ml    LBM: Last BM Date : 12/17/22 Baseline Weight: Weight: 117 kg Most recent weight: Weight: 120.1 kg     Palliative Assessment/Data:   Flowsheet Rows    Flowsheet Row Most Recent Value  Intake Tab   Referral Department Hospitalist  Unit at Time of Referral ICU  Palliative Care Primary Diagnosis Cardiac  Date Notified 12/18/22  Palliative Care Type New Palliative care  Reason for referral Clarify Goals of Care  Date of Admission 12/17/22  Date first seen by Palliative Care 12/19/22  # of days Palliative referral response time 1 Day(s)  # of days IP prior to Palliative referral 1  Clinical Assessment   Palliative Performance Scale Score 10%  Pain Max last 24 hours Not able to report  Pain Min Last 24 hours Not able to report  Dyspnea Max Last 24 Hours Not able to report  Dyspnea Min Last 24 hours Not able to report  Psychosocial & Spiritual Assessment   Palliative Care Outcomes        Time In: 0840 Time Out: 0955 Time  Total: 75 minutes   Greater than 50%  of this time was spent  counseling and coordinating care related to the above assessment and plan.  Signed by: Drue Novel, NP   Please contact Palliative Medicine Team phone at [phone] for questions and concerns.  For individual provider: See Shea Evans

## 2022-12-19 NOTE — Consult Note (Signed)
PHARMACY CONSULT NOTE - ELECTROLYTES  Pharmacy Consult for Electrolyte Monitoring and Replacement   Recent Labs: Potassium (mmol/L)  Date Value  12/19/2022 4.8  05/10/2012 4.2   Magnesium (mg/dL)  Date Value  12/19/2022 2.3   Calcium (mg/dL)  Date Value  12/19/2022 10.1   Calcium, Total (mg/dL)  Date Value  05/10/2012 9.4   Albumin (g/dL)  Date Value  12/19/2022 2.9 (L)  05/09/2012 3.5   Phosphorus (mg/dL)  Date Value  12/19/2022 3.7   Sodium (mmol/L)  Date Value  12/19/2022 142  05/10/2012 139   Corrected Ca: 11.0 mg/dL  Assessment  Jill Smith is a 71 y.o. female presenting with AMS and hypothermia. PMH significant for bipolar disorder, depression, HTN, obesity, schizophrenia, tubular adenoma of colon . Pharmacy has been consulted to monitor and replace electrolytes.  Diet: TF @ 20 mL/hr  Goal of Therapy: Electrolytes WNL  Plan:  Potassium: 4.9 >> 4.8, no replacement needed  Magnesium: 2.3 >> 2.3, no replacement needed Phosphorus: 3.6 >> 3.7, no replacement needed Check BMP, Mg, Phos with AM labs  Thank you for allowing pharmacy to be a part of this patient's care.  Gretel Acre, PharmD PGY1 Pharmacy Resident 12/19/2022 7:17 AM

## 2022-12-19 NOTE — Progress Notes (Signed)
NAME:  ANNAMARY BUSCHMAN, MRN:  [id number], DOB:  1951/03/17, LOS: 2 ADMISSION DATE:  12/17/2022 CHIEF COMPLAINT:  Pancreatitis   History of Present Illness:   71 yo female who presented to [facility] ER on 12/18 from home via EMS with altered mental status and hypothermia.  Per EMS when they arrived at pts home she was non-verbal which is not her baseline, and cold to touch with inability to obtain a temp.  Pts family reported the heater in their home stopped working the night of 12/17, and pt was found in this state the morning of 12/18 prompting EMS notification.  No further history was obtainable.   ED Course  Upon arrival to the ER vital signs were: temp 81.8 F rectally/bp 154/87/hr 36/ O2 sats 87% on RA.  Bear hugger applied and pt received 1L of warm saline due to severe hypothermia.  Lab results were: K+ 5.2/calcium 10.4/AST 73/ALT 129/total protein 6.3/wbc 3.2/platelet count 92.  COVID-19/Influenza A&B by PCR negative. EKG with sinus bradycardia. CT head with no acute abnormality (chronic findings noted). CT abdomen/pelvis showed findings of acute pancreatitis with early peripancreatic necrosis. There was also cholelithiasis (8 mm CBD) and a large gallstone in the neck of the gallbladder as well. Small volume ascites and bilateral pleural effusions were noted. There was also adrenal hyperplasia.  Surgery and GI were consulted, both recommending conservative medical management. The patient went on to develop a cardiac 12/19 around 0200 in the morning where by she was intubated. In the ICU, she required low dose pressors. Broad spectrum antibiotics were initiated and hemodynamic support continued. Intra-abdominal measured overnight notable for rising pressure.  Pertinent  Medical History  Bipolar Disorder Depression HTN Obesity Schizophrenia  Tubular Adenoma of Colon   Significant Hospital Events: Including procedures, antibiotic start and stop dates in addition to other pertinent events    12/18: Pt admitted to ICU with acute metabolic encephalopathy, severe hypothermia (reported heater was nonfunctional at home) and sepsis secondary to pneumonia and acute pancreatitis  12/18: Overnight during rewarming pt became bradycardic and subsequently asystole requiring initiation of ACLS protocol and mechanical intubation prior to [place] 12/19: Pt had another episode of bradycardia followed by transient hypotension requiring continuous levophed gtt. Rewarmed. 12/20: increased intraabdominal pressures (measured at 40 mmHg). Continue rewarming.  Interim History / Subjective:  Opens eyes to voice, doesn't follow commands.  Objective   Blood pressure 104/80, pulse 76, temperature (!) 96.4 F (35.8 C), resp. rate 16, height '5\' 9"'$  (1.753 m), weight 120.1 kg, SpO2 95 %. CVP:  [2 mmHg-59 mmHg] 59 mmHg  Vent Mode: PRVC FiO2 (%):  [28 %] 28 % Set Rate:  [15 bmp] 15 bmp Vt Set:  [450 mL] 450 mL PEEP:  [5 cmH20] 5 cmH20 Plateau Pressure:  [15 cmH20-16 cmH20] 16 cmH20   Intake/Output Summary (Last 24 hours) at 12/19/2022 0759 Last data filed at 12/18/2022 1801 Gross per 24 hour  Intake 999.72 ml  Output 130 ml  Net 869.72 ml   Filed Weights   12/18/22 0143 12/19/22 0500  Weight: 117 kg 120.1 kg    Examination: Physical Exam Constitutional:      General: She is not in acute distress.    Appearance: She is obese. She is ill-appearing.  HENT:     Mouth/Throat:     Mouth: Mucous membranes are moist.     Comments: ETT in place Eyes:     Comments: Pin point pupils bilaterally  Cardiovascular:     Rate and  Rhythm: Normal rate and regular rhythm.     Pulses: Normal pulses.     Heart sounds: Normal heart sounds.  Pulmonary:     Breath sounds: Normal breath sounds.     Comments: Ventilated breath sounds anteriorly Abdominal:     General: There is distension.     Palpations: Abdomen is soft.     Tenderness: There is no abdominal tenderness.  Musculoskeletal:     Cervical back:  Neck supple.  Skin:    General: Skin is warm.  Neurological:     Comments: Sedated      Assessment & Plan:   71 year old female presenting to the hospital with AMS found to have acute pancreatitis, complicated by respiratory failure and subsequently cardiac arrest. She is now managed for complications of her pancreatitis.  Neurology #Toxic Metabolic Encephalopathy  In the setting on acute pancreatitis and hypothermia. Intubated and did receive sedation - now held to assess mental status, patient remains minimally responsive. EEG yesterday with diffuse slowing and no epileptiform discharges.  -Goal RASS -1  Cardiovascular #Distributive Shock #Tachyarrhythmia  In the setting of acute pancreatitis. Lactic acidosis on presentation has resolved, most recent LA was 1.1. Patient has a history of HTN. Given the concern for abdominal compartment syndrome, will aim for a higher MAP and continue nor-epinephrine and vasopressin to that effect. Patient also had multiple runs of V-tach overnight, and did experience bradycardia yesterday. Now that she's rewarmed, her heart rate is better. Will closely monitor electrolytes and correct them given the runs of V-tach. Did develop an episode of bradycardia concerning for a second degree AV block on the monitor, will consult cardiology for input.  -titrate vasopressors to goal MAP of 65 -Cardiology consult today  Pulmonary #Acute Hypoxic Respiratory Failure  In the setting of pancreatitis. Patient at high risk for aspiration given her mental status and the abdominal CT has infiltrates in the RLL and a minimal pleural effusion. Currently on minimal vent settings.  -ABG today 7.41/46/93 -HOB at 15 degrees given compartment syndrome -implement lung protective strategies -Plateau pressures less than 30 cm H20 -Wean FiO2 & PEEP as tolerated to maintain O2 sats >92%  Vent Mode: PRVC FiO2 (%):  [28 %] 28 % Set Rate:  [15 bmp] 15 bmp Vt Set:  [450 mL]  450 mL PEEP:  [5 cmH20] 5 cmH20 Plateau Pressure:  [15 cmH20-16 cmH20] 16 cmH20   GI #Moderate to Severe Pancreatitis (likely gallstone)  Abdominal CT with pancreatitis and the development of early peripancreatic necrosis with interstitial edematous pancreatitis. Measurement of bladder pressures shows them to be rising overnight, and this morning they are elevated at 40 concerning for intra-abdominal compartment syndrome. Highly appreciate recommendations from surgery this morning. Will obtain a repeat non-contrasted abdominal CT and continue to attempt to decrease abdominal pressure. Vent settings minimal, head of bed lowered, and will see if there is a pocket that can be drained. Patient started on early trickle feeds, and has received fluid resuscitation on admission. Continuing with gentle fluids given concern for intra-abdominal compartment syndrome. No signs of DIC.  -CT abdomen without contrast -IV PPI daily -trickle feeds -monitor LFT's  Renal  #AKI  In the setting of pancreatitis and concern for intra-abdominal compartment syndrome. On gentle IV fluids for the time being, concern for worsening compartment syndrome with more aggressive volume expansion. Avoiding nephrotoxic medications  -D5/LR at 75 ml/hour  Endo Close glucose monitoring, on a dextrose infusion. Stress dose steroids with hydrocortisone.  Hem/Onc Heparin subQ  for prophylaxis. Monitor closely for signs of DIC.  ID Broad spectrum antibiotics to cover for concern of aspiration pneumonia.  Best Practice (right click and "Reselect all SmartList Selections" daily)   Diet/type: tubefeeds DVT prophylaxis: prophylactic heparin  GI prophylaxis: PPI Lines: N/A Foley:  Yes, and it is still needed Code Status:  full code Last date of multidisciplinary goals of care discussion [12/19/2022]  Labs   CBC: Recent Labs  Lab 12/17/22 1401 12/18/22 0122 12/19/22 0409  WBC 3.2* 8.0 8.2  NEUTROABS 2.1  --   --    HGB 14.8 12.6 11.7*  HCT 46.4* 38.9 37.2  MCV 88.2 86.4 88.4  PLT 92* 95* 78*    Basic Metabolic Panel: Recent Labs  Lab 12/17/22 1401 12/18/22 0122 12/18/22 1146 12/18/22 1811 12/19/22 0409  NA 141 144 143 142 142  K 5.2* 4.3 4.9 4.9 4.8  CL 107 111 112* 109 112*  CO2 '28 26 27 27 26  '$ GLUCOSE 102* 42* 110* 127* 126*  BUN 27* 28* 32* 35* 38*  CREATININE 0.90 1.16* 1.51* 1.65* 2.18*  CALCIUM 10.4* 10.0 10.5* 10.2 10.1  MG  --  2.3 2.4 2.3 2.3  PHOS  --  3.5 3.4 3.6 3.7   GFR: Estimated Creatinine Clearance: 32.8 mL/min (A) (by C-G formula based on SCr of 2.18 mg/dL (H)). Recent Labs  Lab 12/17/22 1401 12/17/22 1557 12/17/22 1558 12/18/22 0122 12/18/22 0442 12/18/22 0836 12/19/22 0409  PROCALCITON  --   --  <0.10 <0.10  --   --  0.11  WBC 3.2*  --   --  8.0  --   --  8.2  LATICACIDVEN 1.6   < >  --  3.1* 2.4* 1.5 1.1   < > = values in this interval not displayed.    Liver Function Tests: Recent Labs  Lab 12/17/22 1401 12/18/22 0122 12/19/22 0409  AST 73* 370* 172*  ALT 129* 481* 333*  ALKPHOS 78 77 70  BILITOT 0.8 1.0 1.1  PROT 6.3* 5.4* 5.3*  ALBUMIN 3.5 2.9* 2.9*   Recent Labs  Lab 12/17/22 1558 12/18/22 0122  LIPASE 380* 533*   No results for input(s): "AMMONIA" in the last 168 hours.  ABG    Component Value Date/Time   PHART 7.41 12/19/2022 0719   PCO2ART 46 12/19/2022 0719   PO2ART 93 12/19/2022 0719   HCO3 29.2 (H) 12/19/2022 0719   O2SAT 98.9 12/19/2022 0719     Coagulation Profile: Recent Labs  Lab 12/17/22 1401  INR 1.1    Cardiac Enzymes: Recent Labs  Lab 12/17/22 1401  CKTOTAL 69    HbA1C: Hgb A1c MFr Bld  Date/Time Value Ref Range Status  02/09/2019 04:13 PM 5.9 (H) 4.8 - 5.6 % Final    Comment:    (NOTE) Pre diabetes:          5.7%-6.4% Diabetes:              >6.4% Glycemic control for   <7.0% adults with diabetes     CBG: Recent Labs  Lab 12/18/22 2220 12/19/22 0008 12/19/22 0203 12/19/22 0546  12/19/22 0753  GLUCAP 132* 124* 113* 132* 153*    Review of Systems:   Unable to obtain  Past Medical History:  She,  has a past medical history of Bipolar disorder ([place]), Depression, Hypertension, Obesity, Schizophrenia ([place]), Shortness of breath dyspnea, and Tubular adenoma of colon.   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 Her smoking use included cigarettes. She has never used smokeless tobacco. She reports that she does not drink alcohol and does not use drugs.   Family History:  Her family history includes Diabetes in her father and mother; Hypertension in her mother; Prostate cancer in her father. There is no history of Breast cancer.   Allergies No Known Allergies   Home Medications  Prior to Admission medications   Medication Sig Start Date End Date Taking? Authorizing Provider  amoxicillin-clavulanate (AUGMENTIN) 875-125 MG tablet Take 1 tablet by mouth 2 (two) times daily. 12/11/22 12/31/22 Yes [provider]  ciprofloxacin (CIPRO) 500 MG tablet Take 500 mg by mouth 2 (two) times daily. 12/11/22 12/31/22 Yes [provider]  metoprolol tartrate (LOPRESSOR) 25 MG tablet Take 25 mg by mouth 2 (two) times daily. 12/06/22  Yes [provider]  [facility] powder Apply 1 Application topically 2 (two) times daily. 12/05/22  Yes [provider]  amLODipine (NORVASC) 5 MG tablet Take 5 mg by mouth daily.    [provider]  aspirin EC 81 MG  tablet Take 81 mg by mouth daily.    [provider]  docusate sodium (COLACE) 100 MG capsule Take 100 mg by mouth daily as needed for mild constipation.     [provider]  isosorbide mononitrate (IMDUR) 60 MG 24 hr tablet Take 60 mg by mouth daily.    [provider]  lisinopril (PRINIVIL,ZESTRIL) 20 MG tablet Take 20 mg by mouth daily.    [provider]  metoprolol tartrate (LOPRESSOR) 50 MG tablet Take 50 mg by mouth 2 (two) times daily.     [provider]  paliperidone (INVEGA SUSTENNA) 234 MG/1.5ML SUSY injection Inject 234 mg into the muscle every 30 (thirty) days.    [provider]  QUEtiapine (SEROQUEL) 100 MG tablet Take 100 mg by mouth at bedtime. 06/15/21   [provider]  SIMBRINZA 1-0.2 % SUSP Place 1 drop into the left eye 3 (three) times daily. Patient not taking: Reported on 12/17/2022 02/20/21   [provider]  vitamin E 400 UNIT capsule Take 800 Units by mouth 2 (two) times daily.     [provider]     Critical care time: 28 minutes    Armando Reichert, MD [facility]

## 2022-12-20 ENCOUNTER — Inpatient Hospital Stay: Payer: Medicare HMO

## 2022-12-20 DIAGNOSIS — K851 Biliary acute pancreatitis without necrosis or infection: Secondary | ICD-10-CM | POA: Diagnosis not present

## 2022-12-20 DIAGNOSIS — I4729 Other ventricular tachycardia: Secondary | ICD-10-CM | POA: Diagnosis not present

## 2022-12-20 DIAGNOSIS — N179 Acute kidney failure, unspecified: Secondary | ICD-10-CM | POA: Diagnosis not present

## 2022-12-20 DIAGNOSIS — T68XXXS Hypothermia, sequela: Secondary | ICD-10-CM | POA: Diagnosis not present

## 2022-12-20 DIAGNOSIS — J690 Pneumonitis due to inhalation of food and vomit: Secondary | ICD-10-CM | POA: Diagnosis not present

## 2022-12-20 DIAGNOSIS — K8511 Biliary acute pancreatitis with uninfected necrosis: Secondary | ICD-10-CM | POA: Diagnosis not present

## 2022-12-20 DIAGNOSIS — T68XXXA Hypothermia, initial encounter: Secondary | ICD-10-CM | POA: Diagnosis not present

## 2022-12-20 DIAGNOSIS — A419 Sepsis, unspecified organism: Secondary | ICD-10-CM | POA: Diagnosis not present

## 2022-12-20 DIAGNOSIS — Z515 Encounter for palliative care: Secondary | ICD-10-CM | POA: Diagnosis not present

## 2022-12-20 DIAGNOSIS — K859 Acute pancreatitis without necrosis or infection, unspecified: Secondary | ICD-10-CM | POA: Diagnosis not present

## 2022-12-20 LAB — BASIC METABOLIC PANEL
Anion gap: 7 (ref 5–15)
BUN: 45 mg/dL — ABNORMAL HIGH (ref 8–23)
CO2: 26 mmol/L (ref 22–32)
Calcium: 9.9 mg/dL (ref 8.9–10.3)
Chloride: 111 mmol/L (ref 98–111)
Creatinine, Ser: 2.46 mg/dL — ABNORMAL HIGH (ref 0.44–1.00)
GFR, Estimated: 20 mL/min — ABNORMAL LOW (ref >60)
Glucose, Bld: 124 mg/dL — ABNORMAL HIGH (ref 70–99)
Potassium: 4.2 mmol/L (ref 3.5–5.1)
Sodium: 144 mmol/L (ref 135–145)

## 2022-12-20 LAB — GLUCOSE, CAPILLARY
Glucose-Capillary: 119 mg/dL — ABNORMAL HIGH (ref 70–99)
Glucose-Capillary: 126 mg/dL — ABNORMAL HIGH (ref 70–99)
Glucose-Capillary: 91 mg/dL (ref 70–99)
Glucose-Capillary: 71 mg/dL (ref 70–99)
Glucose-Capillary: 67 mg/dL — ABNORMAL LOW (ref 70–99)
Glucose-Capillary: 124 mg/dL — ABNORMAL HIGH (ref 70–99)
Glucose-Capillary: 105 mg/dL — ABNORMAL HIGH (ref 70–99)
Glucose-Capillary: 82 mg/dL (ref 70–99)

## 2022-12-20 LAB — CBC WITH DIFFERENTIAL/PLATELET
Abs Immature Granulocytes: 0.02 10*3/uL (ref 0.00–0.07)
Basophils Absolute: 0 10*3/uL (ref 0.0–0.1)
Basophils Relative: 0 %
Eosinophils Absolute: 0 10*3/uL (ref 0.0–0.5)
Eosinophils Relative: 0 %
HCT: 31.3 % — ABNORMAL LOW (ref 36.0–46.0)
Hemoglobin: 10.1 g/dL — ABNORMAL LOW (ref 12.0–15.0)
Immature Granulocytes: 0 %
Lymphocytes Relative: 12 %
Lymphs Abs: 0.8 10*3/uL (ref 0.7–4.0)
MCH: 28.4 pg (ref 26.0–34.0)
MCHC: 32.3 g/dL (ref 30.0–36.0)
MCV: 87.9 fL (ref 80.0–100.0)
Monocytes Absolute: 0.7 10*3/uL (ref 0.1–1.0)
Monocytes Relative: 9 %
Neutro Abs: 5.6 10*3/uL (ref 1.7–7.7)
Neutrophils Relative %: 79 %
Platelets: 56 10*3/uL — ABNORMAL LOW (ref 150–400)
RBC: 3.56 MIL/uL — ABNORMAL LOW (ref 3.87–5.11)
RDW: 14.7 % (ref 11.5–15.5)
WBC: 7.2 10*3/uL (ref 4.0–10.5)
nRBC: 0.6 % — ABNORMAL HIGH (ref 0.0–0.2)

## 2022-12-20 LAB — CULTURE, RESPIRATORY W GRAM STAIN: Culture: NORMAL

## 2022-12-20 LAB — HEPATIC FUNCTION PANEL
ALT: 239 U/L — ABNORMAL HIGH (ref 0–44)
AST: 82 U/L — ABNORMAL HIGH (ref 15–41)
Albumin: 2.8 g/dL — ABNORMAL LOW (ref 3.5–5.0)
Alkaline Phosphatase: 61 U/L (ref 38–126)
Bilirubin, Direct: 0.2 mg/dL (ref 0.0–0.2)
Indirect Bilirubin: 1.1 mg/dL — ABNORMAL HIGH (ref 0.3–0.9)
Total Bilirubin: 1.3 mg/dL — ABNORMAL HIGH (ref 0.3–1.2)
Total Protein: 5.5 g/dL — ABNORMAL LOW (ref 6.5–8.1)

## 2022-12-20 LAB — THYROID PANEL WITH TSH
Free Thyroxine Index: 2.2 (ref 1.2–4.9)
T3 Uptake Ratio: 29 % (ref 24–39)
T4, Total: 7.5 ug/dL (ref 4.5–12.0)
TSH: 2.54 u[IU]/mL (ref 0.450–4.500)

## 2022-12-20 LAB — VITAMIN B1: Vitamin B1 (Thiamine): 85.8 nmol/L (ref 66.5–200.0)

## 2022-12-20 LAB — MAGNESIUM: Magnesium: 2.5 mg/dL — ABNORMAL HIGH (ref 1.7–2.4)

## 2022-12-20 LAB — PHOSPHORUS: Phosphorus: 3.8 mg/dL (ref 2.5–4.6)

## 2022-12-20 MED ORDER — MIDAZOLAM HCL 2 MG/2ML IJ SOLN
2.0000 mg | Freq: Once | INTRAMUSCULAR | Status: AC
  Administered 2022-12-20: 2 mg via INTRAVENOUS
  Filled 2022-12-20: qty 2

## 2022-12-20 MED ORDER — HYDROCORTISONE SOD SUC (PF) 100 MG IJ SOLR
50.0000 mg | Freq: Two times a day (BID) | INTRAMUSCULAR | Status: AC
  Administered 2022-12-20 – 2022-12-21 (×3): 50 mg via INTRAVENOUS
  Filled 2022-12-20 (×3): qty 2

## 2022-12-20 MED ORDER — DEXTROSE-NACL 5-0.45 % IV SOLN: INTRAVENOUS | Status: DC

## 2022-12-20 MED ORDER — DEXTROSE 50 % IV SOLN
12.5000 g | INTRAVENOUS | Status: AC
  Administered 2022-12-20: 12.5 g via INTRAVENOUS

## 2022-12-20 MED ORDER — DEXTROSE 50 % IV SOLN
INTRAVENOUS | Status: AC
  Filled 2022-12-20: qty 50

## 2022-12-20 NOTE — Progress Notes (Signed)
[facility] Day(s): 3.   Interval History:  Patient seen and examined Remains intubated Off vasopressors More alert today; follows commands Renal function worsening; sCr - 2.46; UO - 245 ccs No significant electrolyte derangements aside from very mild hypermagnesemia  Mild hyperbilirubinemia to 1.3 (indirect 1.1) Continues on Zosyn On trickle feeds; 20 ml/hr  Vital signs in last 24 hours: [min-max] current  Temp:  [95 F (35 C)-99.3 F (37.4 C)] 96.4 F (35.8 C) (12/21 0630) Pulse Rate:  [36-120] 82 (12/21 0630) Resp:  [6-27] 15 (12/21 0630) BP: (73-150)/(53-111) 139/67 (12/21 0615) SpO2:  [96 %-100 %] 99 % (12/21 0630) FiO2 (%):  [28 %] 28 % (12/21 0407) Weight:  [123.7 kg] 123.7 kg (12/21 0500)     Height: '5\' 9"'$  (175.3 cm) Weight: 123.7 kg BMI (Calculated): 40.25   Intake/Output last 2 shifts:  12/20 0701 - 12/21 0700 In: 4026.9 [I.V.:2758.6; NG/GT:770; IV Piggyback:248.4] Out: 245 [Urine:245]   Physical Exam:  Constitutional: Intubated; more alert; follows commands Respiratory: On Ventilator  Cardiovascular: regular rate and sinus rhythm on monitor Gastrointestinal: Abdomen is morbidly obese, she does not appear tender, non-distended Genitourinary: Foley in place  Labs:     Latest Ref Rng & Units 12/19/2022    4:09 AM 12/18/2022    1:22 AM 12/17/2022    2:01 PM  CBC  WBC 4.0 - 10.5 K/uL 8.2  8.0  3.2   Hemoglobin 12.0 - 15.0 g/dL 11.7  12.6  14.8   Hematocrit 36.0 - 46.0 % 37.2  38.9  46.4   Platelets 150 - 400 K/uL 78  95  92       Latest Ref Rng & Units 12/20/2022    2:06 AM 12/19/2022    1:47 PM 12/19/2022    4:09 AM  CMP  Glucose 70 - 99 mg/dL 124  115  126   BUN 8 - 23 mg/dL 45  42  38   Creatinine 0.44 - 1.00 mg/dL 2.46  2.35  2.18   Sodium 135 - 145 mmol/L 144  143  142   Potassium 3.5 - 5.1 mmol/L 4.2  4.6  4.8   Chloride 98 - 111 mmol/L 111  112  112   CO2 22 - 32 mmol/L '26  25  26   '$ Calcium  8.9 - 10.3 mg/dL 9.9  10.3  10.1   Total Protein 6.5 - 8.1 g/dL 5.5   5.3   Total Bilirubin 0.3 - 1.2 mg/dL 1.3   1.1   Alkaline Phos 38 - 126 U/L 61   70   AST 15 - 41 U/L 82   172   ALT 0 - 44 U/L 239   333      Imaging studies:   CT Chest/Abdomen/Pelvis (12/19/2022) personally reviewed showing improvement in pancreatic process, cholelithiasis, and radiologist report reviewed below:  IMPRESSION: Minimally displaced fractures are seen involving the anterior portions of the bilateral seventh and eighth ribs.   Minimal bilateral pleural effusions are noted with mild adjacent subsegmental atelectasis.   Mild right upper lobe subsegmental atelectasis or infiltrate is noted.   12 mm subpleural rounded airspace opacity is noted in right lung apex which may represent focal inflammation, but follow-up CT scan in 3 months is recommended to ensure stability or resolution and rule out neoplasm.   Cholelithiasis without inflammation.   Decreased inflammatory stranding is noted around the pancreas consistent with mild pancreatitis which may be improved compared to prior exam.  No pseudocyst formation is noted.   Endotracheal and nasogastric tubes are in grossly good position.   Stable mild bilateral adrenal gland enlargement is noted most consistent with adrenal hyperplasia.   Small amount of free fluid is noted in the pelvis which may be physiologic, but alternately may be due to pancreatitis or inflammation.   Aortic Atherosclerosis (ICD10-I70.0).   Assessment/Plan: (ICD-10's: K85.90) 71 y.o. critically ill female with severe pancreatitis with possible necrosis, now improving radiographically (12/20), cholelithiasis complicated by hypothermia, asystolic event requiring CPR with ROSC, PNA.   - Okay to continue trickle feeds  - No surgical intervention   - Continue supportive care per PCCM; appreciate their effort    - Appreciate GI input and recommendations  - Appreciate  Palliative care discussions; prognosis guarded   - For now, nothing to offer from surgical perspective, CT with improvement. We will follow peripherally for now but remain available should her condition or needs change. Please call/message with questions.    All of the above findings and recommendations were discussed with the medical team. No family at bedside.   -- Edison Simon, PA-C [facility] 12/20/2022, 7:27 AM M-F: 7am - 4pm

## 2022-12-20 NOTE — Progress Notes (Signed)
Pt extubated to BiPAP per MD order

## 2022-12-20 NOTE — Consult Note (Signed)
PHARMACY CONSULT NOTE - ELECTROLYTES  Pharmacy Consult for Electrolyte Monitoring and Replacement   Recent Labs: Potassium (mmol/L)  Date Value  12/20/2022 4.2  05/10/2012 4.2   Magnesium (mg/dL)  Date Value  12/20/2022 2.5 (H)   Calcium (mg/dL)  Date Value  12/20/2022 9.9   Calcium, Total (mg/dL)  Date Value  05/10/2012 9.4   Albumin (g/dL)  Date Value  12/20/2022 2.8 (L)  05/09/2012 3.5   Phosphorus (mg/dL)  Date Value  12/20/2022 3.8   Sodium (mmol/L)  Date Value  12/20/2022 144  05/10/2012 139   Corrected Ca: 11.0 mg/dL  Assessment  Jill Smith is a 71 y.o. female presenting with AMS and hypothermia. PMH significant for bipolar disorder, depression, HTN, obesity, schizophrenia, tubular adenoma of colon . Pharmacy has been consulted to monitor and replace electrolytes.  Diet: TF @ 20 mL/hr  Goal of Therapy: Electrolytes WNL  Plan:  Potassium: 4.6 >> 4.2, no replacement needed  Magnesium: 2.3 >> 2.5, no replacement needed Phosphorus: 3.7 >> 3.8, no replacement needed Pharmacy will sign off and continue to monitor peripherally  Thank you for allowing pharmacy to be a part of this patient's care.  Gretel Acre, PharmD PGY1 Pharmacy Resident 12/20/2022 10:37 AM

## 2022-12-20 NOTE — Progress Notes (Signed)
NAME:  Jill Smith, MRN:  [id number], DOB:  [birth year], LOS: 3 ADMISSION DATE:  12/17/2022 CHIEF COMPLAINT:  Pancreatitis   History of Present Illness:   71 yo female who presented to [facility] ER on 12/18 from home via EMS with altered mental status and hypothermia.  Per EMS when they arrived at pts home she was non-verbal which is not her baseline, and cold to touch with inability to obtain a temp.  Pts family reported the heater in their home stopped working the night of 12/17, and pt was found in this state the morning of 12/18 prompting EMS notification.  No further history was obtainable.   ED Course  Upon arrival to the ER vital signs were: temp 81.8 F rectally/bp 154/87/hr 36/ O2 sats 87% on RA.  Bear hugger applied and pt received 1L of warm saline due to severe hypothermia.  Lab results were: K+ 5.2/calcium 10.4/AST 73/ALT 129/total protein 6.3/wbc 3.2/platelet count 92.  COVID-19/Influenza A&B by PCR negative. EKG with sinus bradycardia. CT head with no acute abnormality (chronic findings noted). CT abdomen/pelvis showed findings of acute pancreatitis with early peripancreatic necrosis. There was also cholelithiasis (8 mm CBD) and a large gallstone in the neck of the gallbladder as well. Small volume ascites and bilateral pleural effusions were noted. There was also adrenal hyperplasia.  Surgery and GI were consulted, both recommending conservative medical management. The patient went on to develop a cardiac 12/19 around 0200 in the morning where by she was intubated. In the ICU, she required low dose pressors. Broad spectrum antibiotics were initiated and hemodynamic support continued. Intra-abdominal measured notable for rising pressure, now improving.  Pertinent  Medical History  Bipolar Disorder Depression HTN Obesity Schizophrenia  Tubular Adenoma of Colon   Significant Hospital Events: Including procedures, antibiotic start and stop dates in addition to other pertinent events    12/18: Pt admitted to ICU with acute metabolic encephalopathy, severe hypothermia (reported heater was nonfunctional at home) and sepsis secondary to pneumonia and acute pancreatitis  12/18: Overnight during rewarming pt became bradycardic and subsequently asystole requiring initiation of ACLS protocol and mechanical intubation prior to [place] 12/19: Pt had another episode of bradycardia followed by transient hypotension requiring continuous levophed gtt. Rewarmed. 12/20: increased intraabdominal pressures (measured at 40 mmHg). Continue rewarming. 12/21: intraabdominal pressure at 19 mmHg this morning. Awake and follows commands - SBT  Interim History / Subjective:  Opens eyes to voice, doesn't follow commands.  Objective   Blood pressure 125/69, pulse 78, temperature (!) 96.6 F (35.9 C), resp. rate (!) 8, height '5\' 9"'$  (1.753 m), weight 123.7 kg, SpO2 98 %. CVP:  [0 mmHg-27 mmHg] 7 mmHg  Vent Mode: PRVC FiO2 (%):  [28 %] 28 % Set Rate:  [15 bmp] 15 bmp Vt Set:  [450 mL] 450 mL PEEP:  [5 cmH20] 5 cmH20 Plateau Pressure:  [16 cmH20] 16 cmH20   Intake/Output Summary (Last 24 hours) at 12/20/2022 0858 Last data filed at 12/20/2022 2536 Gross per 24 hour  Intake 4314.18 ml  Output 275 ml  Net 4039.18 ml    Filed Weights   12/18/22 0143 12/19/22 0500 12/20/22 0500  Weight: 117 kg 120.1 kg 123.7 kg    Examination: Physical Exam Constitutional:      General: She is not in acute distress.    Appearance: She is obese. She is ill-appearing.  HENT:     Mouth/Throat:     Mouth: Mucous membranes are moist.     Comments: ETT  in place Eyes:     Comments: Pin point pupils bilaterally  Cardiovascular:     Rate and Rhythm: Normal rate and regular rhythm.     Pulses: Normal pulses.     Heart sounds: Normal heart sounds.  Pulmonary:     Breath sounds: Normal breath sounds.     Comments: Ventilated breath sounds anteriorly Abdominal:     General: There is distension.     Palpations:  Abdomen is soft.     Tenderness: There is no abdominal tenderness.  Musculoskeletal:     Cervical back: Neck supple.  Skin:    General: Skin is warm.  Neurological:     Comments: Follows commands this morning      Assessment & Plan:   71 year old female presenting to the hospital with AMS found to have acute pancreatitis, complicated by respiratory failure and subsequently cardiac arrest. She is now managed for complications of her pancreatitis.  Neurology #Toxic Metabolic Encephalopathy  In the setting on acute pancreatitis and hypothermia. Intubated and did receive sedation - now held to assess mental status, patient remains minimally responsive. EEG yesterday with diffuse slowing and no epileptiform discharges.  -Goal RASS -1 -daily awakening trials  Cardiovascular #Distributive Shock #Tachyarrhythmia  In the setting of acute pancreatitis. Lactic acidosis on presentation has resolved, LA was 1.1. Patient has a history of HTN. Given the was concern for abdominal compartment syndrome, we did aim for a higher MAP, but this is now less of a concern. Patient also had multiple runs of V-tach, and did experience bradycardia with Mobitz type 2, highly appreciate input from cardiology who will consider EP evaluation for pacemaker with improvement in her condition. Will closely monitor electrolytes and correct them.   -titrate vasopressors to goal MAP of 65 -transcutaneous pacer at bedside -avoid nodal acting agents  Pulmonary #Acute Hypoxic Respiratory Failure  In the setting of pancreatitis. Patient at high risk for aspiration given her mental status and the abdominal CT has infiltrates in the RLL and a minimal pleural effusion. Currently on minimal vent settings.  -HOB at 15 degrees given compartment syndrome -implement lung protective strategies -Plateau pressures less than 30 cm H20 -Wean FiO2 & PEEP as tolerated to maintain O2 sats >92% -SBT today, extubate to BIPAP if  able  Vent Mode: PRVC FiO2 (%):  [28 %] 28 % Set Rate:  [15 bmp] 15 bmp Vt Set:  [450 mL] 450 mL PEEP:  [5 cmH20] 5 cmH20 Plateau Pressure:  [16 cmH20] 16 cmH20   GI #Moderate to Severe Pancreatitis (likely gallstone)  Abdominal CT with pancreatitis and the development of early peripancreatic necrosis with interstitial edematous pancreatitis. Measurement of bladder pressures showed them to be rising yesterday, and they subsequently improved. No signs of compartment syndrome on clinical exam. Highly appreciate recommendations from surgery. Repeat CT re-assuring and the patient is tolerating tube feeds. Vent settings minimal, head of bed lowered. Patient started on early trickle feeds, and has received fluid resuscitation on admission. Continuing with gentle fluids given concern for intra-abdominal compartment syndrome. No signs of DIC.  -IV PPI daily -trickle feeds -monitor LFT's  Renal  #AKI  In the setting of pancreatitis and concern for intra-abdominal compartment syndrome. On gentle IV fluids for the time being, concern for worsening compartment syndrome with more aggressive volume expansion. Avoiding nephrotoxic medications  -stop D5/LR at 75 ml/hour  Endo Close glucose monitoring, on a dextrose infusion. Stress dose steroids with hydrocortisone, will decrease to 50 bid.  Hem/Onc Heparin  subQ for prophylaxis. Monitor closely for signs of DIC.  ID Broad spectrum antibiotics to cover for concern of aspiration pneumonia. Will discontinue today  Best Practice (right click and "Reselect all SmartList Selections" daily)   Diet/type: tubefeeds DVT prophylaxis: prophylactic heparin  GI prophylaxis: PPI Lines: N/A Foley:  Yes, and it is still needed Code Status:  full code Last date of multidisciplinary goals of care discussion [12/20/2022]  Labs   CBC: Recent Labs  Lab 12/17/22 1401 12/18/22 0122 12/19/22 0409  WBC 3.2* 8.0 8.2  NEUTROABS 2.1  --   --   HGB 14.8  12.6 11.7*  HCT 46.4* 38.9 37.2  MCV 88.2 86.4 88.4  PLT 92* 95* 78*     Basic Metabolic Panel: Recent Labs  Lab 12/18/22 1146 12/18/22 1811 12/19/22 0409 12/19/22 1347 12/20/22 0206  NA 143 142 142 143 144  K 4.9 4.9 4.8 4.6 4.2  CL 112* 109 112* 112* 111  CO2 '27 27 26 25 26  '$ GLUCOSE 110* 127* 126* 115* 124*  BUN 32* 35* 38* 42* 45*  CREATININE 1.51* 1.65* 2.18* 2.35* 2.46*  CALCIUM 10.5* 10.2 10.1 10.3 9.9  MG 2.4 2.3 2.3 2.3 2.5*  PHOS 3.4 3.6 3.7 3.7 3.8    GFR: Estimated Creatinine Clearance: 29.5 mL/min (A) (by C-G formula based on SCr of 2.46 mg/dL (H)). Recent Labs  Lab 12/17/22 1401 12/17/22 1557 12/17/22 1558 12/18/22 0122 12/18/22 0442 12/18/22 0836 12/19/22 0409  PROCALCITON  --   --  <0.10 <0.10  --   --  0.11  WBC 3.2*  --   --  8.0  --   --  8.2  LATICACIDVEN 1.6   < >  --  3.1* 2.4* 1.5 1.1   < > = values in this interval not displayed.     Liver Function Tests: Recent Labs  Lab 12/17/22 1401 12/18/22 0122 12/19/22 0409 12/20/22 0206  AST 73* 370* 172* 82*  ALT 129* 481* 333* 239*  ALKPHOS 78 77 70 61  BILITOT 0.8 1.0 1.1 1.3*  PROT 6.3* 5.4* 5.3* 5.5*  ALBUMIN 3.5 2.9* 2.9* 2.8*    Recent Labs  Lab 12/17/22 1558 12/18/22 0122  LIPASE 380* 533*    No results for input(s): "AMMONIA" in the last 168 hours.  ABG    Component Value Date/Time   PHART 7.41 12/19/2022 0719   PCO2ART 46 12/19/2022 0719   PO2ART 93 12/19/2022 0719   HCO3 29.2 (H) 12/19/2022 0719   O2SAT 98.9 12/19/2022 0719     Coagulation Profile: Recent Labs  Lab 12/17/22 1401  INR 1.1     Cardiac Enzymes: Recent Labs  Lab 12/17/22 1401  CKTOTAL 69     HbA1C: Hgb A1c MFr Bld  Date/Time Value Ref Range Status  02/09/2019 04:13 PM 5.9 (H) 4.8 - 5.6 % Final    Comment:    (NOTE) Pre diabetes:          5.7%-6.4% Diabetes:              >6.4% Glycemic control for   <7.0% adults with diabetes     CBG: Recent Labs  Lab 12/19/22 1603  12/19/22 1921 12/19/22 2358 12/20/22 0315 12/20/22 0749  GLUCAP 117* 81 123* 119* 126*     Review of Systems:   Unable to obtain  Past Medical History:  She,  has a past medical history of Bipolar disorder ([place]), Depression, Hypertension, Obesity, Schizophrenia ([place]), Shortness of breath dyspnea, and Tubular adenoma of colon.  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 Her smoking use included cigarettes. She has never used smokeless tobacco. She reports that she does not drink alcohol and does not use drugs.   Family History:  Her family history includes Diabetes in her father and mother; Hypertension in her mother; Prostate cancer in her father. There is no history of Breast cancer.   Allergies No Known Allergies   Home Medications  Prior to Admission medications   Medication Sig Start Date End Date Taking? Authorizing Provider  amoxicillin-clavulanate (AUGMENTIN) 875-125 MG tablet Take 1 tablet by mouth 2 (two) times daily. 12/11/22 12/31/22 Yes [provider]  ciprofloxacin (CIPRO) 500 MG tablet Take 500 mg by mouth 2 (two) times daily. 12/11/22 12/31/22 Yes [provider]  metoprolol tartrate (LOPRESSOR) 25 MG tablet Take 25 mg by mouth 2 (two) times daily. 12/06/22  Yes [provider]  [facility] powder Apply 1 Application topically 2 (two) times daily. 12/05/22  Yes [provider]  amLODipine (NORVASC) 5 MG tablet Take 5 mg by mouth daily.     [provider]  aspirin EC 81 MG tablet Take 81 mg by mouth daily.    [provider]  docusate sodium (COLACE) 100 MG capsule Take 100 mg by mouth daily as needed for mild constipation.     [provider]  isosorbide mononitrate (IMDUR) 60 MG 24 hr tablet Take 60 mg by mouth daily.    [provider]  lisinopril (PRINIVIL,ZESTRIL) 20 MG tablet Take 20 mg by mouth daily.    [provider]  metoprolol tartrate (LOPRESSOR) 50 MG tablet Take 50 mg by mouth 2 (two) times daily.     [provider]  paliperidone (INVEGA SUSTENNA) 234 MG/1.5ML SUSY injection Inject 234 mg into the muscle every 30 (thirty) days.    [provider]  QUEtiapine (SEROQUEL) 100 MG tablet Take 100 mg by mouth at bedtime. 06/15/21   [provider]  SIMBRINZA 1-0.2 % SUSP Place 1 drop into the left eye 3 (three) times daily. Patient not taking: Reported on 12/17/2022 02/20/21   [provider]  vitamin E 400 UNIT capsule Take 800 Units by mouth 2 (two) times daily.     [provider]     Critical care time: 31 minutes    Armando Reichert, MD [facility]

## 2022-12-20 NOTE — Progress Notes (Signed)
Hypoglycemic Event  CBG: 67   Time:1810  Treatment: D50 25 mL (12.5 gm)  Symptoms: None  Follow-up CBG: Time:1833 CBG Result:124  Possible Reasons for Event: Inadequate meal intake  Comments/MD notified:Dgayli    Addison Naegeli

## 2022-12-20 NOTE — TOC Initial Note (Signed)
Transition of Care [facility]) - Initial/Assessment Note    Patient Details  Name: Jill Smith MRN: [id number] Date of Birth: [birth year]  Transition of Care [facility]: [place] Rhonda W) CM/SW Contact:    Shelbie Hutching, RN Phone Number: 12/20/2022, 2:51 PM  Clinical Narrative:                 Patient admitted to the hospital for hypothermia, extubated earlier this morning, now on [place] 2L.  Patient is from home she is open with [facility] for PT, OT and aide.    Planned Disposition: Home with Health Care Svc Barriers to Discharge: Continued Medical Work up   Patient Goals and CMS Choice   CMS Medicare.gov Compare Post Acute Care list provided to:: Patient Choice offered to / list presented to : Patient      Expected Discharge Plan and Services Planned Disposition: Home with Health Care Svc   Discharge Planning Services: CM Consult   Living arrangements for the past 2 months: Single Family Home                           HH Arranged: OT, PT, Nurse's Aide [place] Agency: [place] Date [place]: 12/20/22 Time [place]: 1449 Representative spoke with at [place]: [place] Arrangements/Services Living arrangements for the past 2 months: [place]   Patient language and need for interpreter reviewed:: Yes        Need for Family Participation in Patient Care: Yes (Comment) Care giver support system in place?: Yes (comment)   Criminal Activity/Legal Involvement Pertinent to Current Situation/Hospitalization: No - Comment as needed  Activities of Daily Living      Permission Sought/Granted                  Emotional Assessment         Alcohol / Substance Use: Not Applicable Psych Involvement: No (comment)  Admission diagnosis:  Hypothermia [T68.XXXA] Hypothermia, initial encounter [T68.XXXA] Acute pancreatitis, unspecified complication status, unspecified pancreatitis type [K85.90] Aspiration pneumonia, unspecified  aspiration pneumonia type, unspecified laterality, unspecified part of lung ([place]) [J69.0] Sepsis, due to unspecified organism, unspecified whether acute organ dysfunction present [facility]) [A41.9] Patient Active Problem List   Diagnosis Date Noted   SIRS (systemic inflammatory response syndrome) ([place]) 12/19/2022   AKI (acute kidney injury) ([place]) 12/19/2022   AV block, Mobitz 2 12/19/2022   Non-traumatic compartment syndrome of abdomen 12/19/2022   NSVT (nonsustained ventricular tachycardia) ([place]) 12/19/2022   Sepsis ([place]) 12/18/2022   Aspiration pneumonia ([place]) 12/18/2022   Pressure injury of skin 12/18/2022   Hypothermia 12/17/2022   Acute pancreatitis 12/17/2022   Calculus of gallbladder with acute cholecystitis without obstruction 12/17/2022   Back pain 02/09/2019   PCP:  [facility] [place], [place] [address] Phone: [phone] Fax: [phone]     Social Determinants of Health (SDOH) Social History: SDOH Screenings   Tobacco Use: Medium Risk (12/19/2022)   SDOH Interventions:     Readmission Risk Interventions     No data to display

## 2022-12-20 NOTE — Progress Notes (Signed)
Palliative: Ms. Manninen is lying quietly in bed.  She is intubated and ventilated but only with as needed sedation.  She is at 28% FiO2 with good saturations.  She will briefly open her eyes when asked.  There is no family at bedside at this time.  Overall, she has stabilized.  Face-to-face meeting with CCM attending/CCM NP.  The goal is for possible extubation today.  Conference with CCM attending and NP, respiratory therapy, transition of care team related to patient condition, needs, goals of care.  PMT to follow.  Plan:   Continue full scope/full code.  Possible extubation today.  Time for outcomes.  Need for rehab would not be surprising.  25 minutes  Quinn Axe, NP Palliative medicine team Team phone [phone] Greater than 50% of this time was spent counseling and coordinating care related to the above assessment and plan.

## 2022-12-20 NOTE — Progress Notes (Signed)
Rounding Note    Patient Name: Jill Smith Date of Encounter: 12/20/2022  [place] Cardiologist: None  New Consult done by Dr Garen Lah Subjective   Patient seen on Am rounds. Remains intubated. No longer on pressors. Will open eyes to verbal stimuli. Not requiring sedation.  Inpatient Medications    Scheduled Meds:  Chlorhexidine Gluconate Cloth  6 each Topical Q0600   feeding supplement (VITAL AF 1.2 CAL)  1,000 mL Per Tube Q24H   free water  30 mL Per Tube Q4H   heparin injection (subcutaneous)  5,000 Units Subcutaneous Q12H   hydrocortisone sod succinate (SOLU-CORTEF) inj  50 mg Intravenous Q6H   nystatin   Topical TID   mouth rinse  15 mL Mouth Rinse Q2H   pantoprazole (PROTONIX) IV  40 mg Intravenous Daily   Continuous Infusions:  sodium chloride 10 mL/hr at 12/20/22 0600   dextrose 5% lactated ringers 75 mL/hr at 12/20/22 0600   norepinephrine (LEVOPHED) Adult infusion 0 mcg/min (12/20/22 0548)   piperacillin-tazobactam (ZOSYN)  IV 3.375 g (12/20/22 0727)   vasopressin Stopped (12/19/22 1038)   PRN Meds: atropine, docusate sodium, fentaNYL (SUBLIMAZE) injection, mouth rinse, polyethylene glycol   Vital Signs    Vitals:   12/20/22 0600 12/20/22 0615 12/20/22 0630 12/20/22 0733  BP: 122/69 139/67    Pulse: 80 80 82   Resp: '17 15 15   '$ Temp: (!) 96.4 F (35.8 C) (!) 96.4 F (35.8 C) (!) 96.4 F (35.8 C)   TempSrc:      SpO2: 99% 99% 99% 100%  Weight:      Height:        Intake/Output Summary (Last 24 hours) at 12/20/2022 0738 Last data filed at 12/20/2022 0600 Gross per 24 hour  Intake 4026.93 ml  Output 245 ml  Net 3781.93 ml      12/20/2022    5:00 AM 12/19/2022    5:00 AM 12/18/2022    1:43 AM  Last 3 Weights  Weight (lbs) 272 lb 11.3 oz 264 lb 12.4 oz 257 lb 15 oz  Weight (kg) 123.7 kg 120.1 kg 117 kg      Telemetry    Sinus to sinus tachycardia, frequent PAC's, frequent unifocal PVC's and with trigeminy pattern  throughout the night - Personally Reviewed  ECG    No new tracings - Personally Reviewed  Physical Exam   GEN: No acute distress.  Neck: No JVD Cardiac: RRR, no murmurs, rubs, or gallops.  Respiratory: Coarse to auscultation bilaterally. Respirations are vent assisted, patient is tolerating vent without difficulty GI: Soft, nontender, mildly distended  MS: No edema; No deformity. Neuro:  Opens eyes to verbal stimuli and tries to track but does have movements with right hand/arm, mittens on for safety Psych: Remains intubated/non-verbal  Labs    High Sensitivity Troponin:   Recent Labs  Lab 12/17/22 1411 12/18/22 0122 12/18/22 0442 12/18/22 0836  TROPONINIHS 5 13 22* 29*     Chemistry Recent Labs  Lab 12/18/22 0122 12/18/22 1146 12/19/22 0409 12/19/22 1347 12/20/22 0206  NA 144   < > 142 143 144  K 4.3   < > 4.8 4.6 4.2  CL 111   < > 112* 112* 111  CO2 26   < > '26 25 26  '$ GLUCOSE 42*   < > 126* 115* 124*  BUN 28*   < > 38* 42* 45*  CREATININE 1.16*   < > 2.18* 2.35* 2.46*  CALCIUM 10.0   < >  10.1 10.3 9.9  MG 2.3   < > 2.3 2.3 2.5*  PROT 5.4*  --  5.3*  --  5.5*  ALBUMIN 2.9*  --  2.9*  --  2.8*  AST 370*  --  172*  --  82*  ALT 481*  --  333*  --  239*  ALKPHOS 77  --  70  --  61  BILITOT 1.0  --  1.1  --  1.3*  GFRNONAA 50*   < > 24* 22* 20*  ANIONGAP 7   < > 4* 6 7   < > = values in this interval not displayed.    Lipids  Recent Labs  Lab 12/18/22 0122  TRIG 41    Hematology Recent Labs  Lab 12/17/22 1401 12/18/22 0122 12/19/22 0409  WBC 3.2* 8.0 8.2  RBC 5.26* 4.50 4.21  HGB 14.8 12.6 11.7*  HCT 46.4* 38.9 37.2  MCV 88.2 86.4 88.4  MCH 28.1 28.0 27.8  MCHC 31.9 32.4 31.5  RDW 13.9 14.1 14.4  PLT 92* 95* 78*   Thyroid  Recent Labs  Lab 12/17/22 1558  TSH 2.540    BNPNo results for input(s): "BNP", "PROBNP" in the last 168 hours.  DDimer  Recent Labs  Lab 12/18/22 0122 12/18/22 1146  DDIMER 5.07* 4.16*     Radiology    CT  CHEST ABDOMEN PELVIS WO CONTRAST  Result Date: 12/19/2022 CLINICAL DATA:  Altered mental status, hypothermia. EXAM: CT CHEST, ABDOMEN AND PELVIS WITHOUT CONTRAST TECHNIQUE: Multidetector CT imaging of the chest, abdomen and pelvis was performed following the standard protocol without IV contrast. RADIATION DOSE REDUCTION: This exam was performed according to the departmental dose-optimization program which includes automated exposure control, adjustment of the mA and/or kV according to patient size and/or use of iterative reconstruction technique. COMPARISON:  December 17, 2022. FINDINGS: CT CHEST FINDINGS Cardiovascular: Atherosclerosis of thoracic aorta is noted without aneurysm formation. Normal cardiac size. No pericardial effusion. Mediastinum/Nodes: Thyroid gland is unremarkable. Endotracheal tube is in grossly good position. Nasogastric tube is seen passing through esophagus and into stomach. No definite adenopathy is noted. Lungs/Pleura: No pneumothorax is noted. Minimal bilateral pleural effusions are noted with mild adjacent subsegmental atelectasis. Mild emphysematous disease is noted. Right upper lobe air space opacity is noted posteriorly suggesting atelectasis or less likely infiltrate. 12 mm opacity is seen in right lung apex most consistent with inflammation. Musculoskeletal: Minimally displaced fractures are seen involving the anterior portion of the bilateral seventh and eighth ribs. CT ABDOMEN PELVIS FINDINGS Hepatobiliary: Cholelithiasis is noted without biliary dilatation. Liver is unremarkable. Pancreas: Mild inflammatory stranding is seen around the pancreas which may be decreased compared to prior exam, consistent with probable pancreatitis. No definite pseudocyst formation is noted. Spleen: Normal in size without focal abnormality. Adrenals/Urinary Tract: Stable mild bilateral adrenal gland enlargement is noted suggesting adrenal hyperplasia. No hydronephrosis or renal obstruction is  noted. No renal or ureteral calculi are noted. Urinary bladder is decompressed secondary to Foley catheter. Stomach/Bowel: Nasogastric tube tip is seen in distal stomach. No definite evidence of bowel obstruction or inflammation. The appendix is unremarkable. Vascular/Lymphatic: Aortic atherosclerosis. No enlarged abdominal or pelvic lymph nodes. Reproductive: Uterus and bilateral adnexa are unremarkable. Other: No definite hernia is noted. Small amount of free fluid is noted in the pelvis which may be related to inflammation or pancreatitis. Musculoskeletal: No acute or significant osseous findings. IMPRESSION: Minimally displaced fractures are seen involving the anterior portions of the bilateral seventh and eighth ribs.  Minimal bilateral pleural effusions are noted with mild adjacent subsegmental atelectasis. Mild right upper lobe subsegmental atelectasis or infiltrate is noted. 12 mm subpleural rounded airspace opacity is noted in right lung apex which may represent focal inflammation, but follow-up CT scan in 3 months is recommended to ensure stability or resolution and rule out neoplasm. Cholelithiasis without inflammation. Decreased inflammatory stranding is noted around the pancreas consistent with mild pancreatitis which may be improved compared to prior exam. No pseudocyst formation is noted. Endotracheal and nasogastric tubes are in grossly good position. Stable mild bilateral adrenal gland enlargement is noted most consistent with adrenal hyperplasia. Small amount of free fluid is noted in the pelvis which may be physiologic, but alternately may be due to pancreatitis or inflammation. Aortic Atherosclerosis (ICD10-I70.0). Electronically Signed   By: Marijo Conception M.D.   On: 12/19/2022 13:05   EEG adult  Result Date: 12/18/2022 Derek Jack, MD     12/18/2022  6:58 PM Routine EEG Report Jill Smith is a 71 y.o. female with a history of cardiac arrest and altered mental status who is  undergoing an EEG to evaluate for seizures. Report: This EEG was acquired with electrodes placed according to the International 10-20 electrode system (including Fp1, Fp2, F3, F4, C3, C4, P3, P4, O1, O2, T3, T4, T5, T6, A1, A2, Fz, Cz, Pz). The following electrodes were missing or displaced: none. The best background was 4-5 Hz. This activity is reactive to stimulation. There was no sleep architecture identified. There was no focal slowing. There were no interictal epileptiform discharges. There were no electrographic seizures identified. Photic stimulation and hyperventilation were not performed. Impression and clinical correlation: This EEG was obtained while comatose and is abnormal due to moderate-to-severe diffuse slowing indicative of global cerebral dysfunction. Epileptiform abnormalities were not seen during this recording. Su Monks, MD [facility] [phone] If 7pm- 7am, please page neurology on call as listed in [place].   ECHOCARDIOGRAM COMPLETE  Result Date: 12/18/2022    ECHOCARDIOGRAM REPORT   Patient Name:   Jill Smith Date of Exam: 12/18/2022 Medical Rec #:  [id number]       Height:       69.0 in Accession #:    [id number]      Weight:       257.9 lb Date of Birth:  03-29-1951       BSA:          2.302 m Patient Age:    44 years        BP:           123/82 mmHg Patient Gender: F               HR:           43 bpm. Exam Location:  [facility] Procedure: 2D Echo, Color Doppler and Cardiac Doppler STAT ECHO Indications:     R06.00 Dyspnea  History:         Patient has no prior history of Echocardiogram examinations.                  Risk Factors:Hypertension.  Sonographer:     Charmayne Sheer Referring Phys:  [id number] Alexander Diagnosing Phys: Neoma Laming  Sonographer Comments: Echo performed with patient supine and on artificial respirator. IMPRESSIONS  1. Left ventricular ejection fraction, by estimation, is 60 to 65%. The left ventricle has normal function. The left ventricle has  no regional wall motion abnormalities. There is moderate left ventricular  hypertrophy. Left ventricular diastolic parameters are consistent with Grade III diastolic dysfunction (restrictive).  2. Right ventricular systolic function is normal. The right ventricular size is normal.  3. Left atrial size was moderately dilated.  4. Right atrial size was mild to moderately dilated.  5. The mitral valve is normal in structure. Mild mitral valve regurgitation. No evidence of mitral stenosis.  6. Tricuspid valve regurgitation is mild to moderate.  7. The aortic valve is calcified. Aortic valve regurgitation is mild to moderate. Aortic valve sclerosis/calcification is present, without any evidence of aortic stenosis.  8. The inferior vena cava is normal in size with greater than 50% respiratory variability, suggesting right atrial pressure of 3 mmHg. FINDINGS  Left Ventricle: Left ventricular ejection fraction, by estimation, is 60 to 65%. The left ventricle has normal function. The left ventricle has no regional wall motion abnormalities. The left ventricular internal cavity size was normal in size. There is  moderate left ventricular hypertrophy. Left ventricular diastolic parameters are consistent with Grade III diastolic dysfunction (restrictive). Right Ventricle: The right ventricular size is normal. No increase in right ventricular wall thickness. Right ventricular systolic function is normal. Left Atrium: Left atrial size was moderately dilated. Right Atrium: Right atrial size was mild to moderately dilated. Pericardium: There is no evidence of pericardial effusion. Mitral Valve: The mitral valve is normal in structure. Mild mitral valve regurgitation. No evidence of mitral valve stenosis. Tricuspid Valve: The tricuspid valve is normal in structure. Tricuspid valve regurgitation is mild to moderate. No evidence of tricuspid stenosis. Aortic Valve: The aortic valve is calcified. Aortic valve regurgitation is mild to  moderate. Aortic regurgitation PHT measures 805 msec. Aortic valve sclerosis/calcification is present, without any evidence of aortic stenosis. Aortic valve mean gradient measures 3.0 mmHg. Aortic valve peak gradient measures 6.7 mmHg. Aortic valve area, by VTI measures 2.58 cm. Pulmonic Valve: The pulmonic valve was normal in structure. Pulmonic valve regurgitation is mild. No evidence of pulmonic stenosis. Aorta: The aortic root is normal in size and structure. Venous: The inferior vena cava is normal in size with greater than 50% respiratory variability, suggesting right atrial pressure of 3 mmHg. IAS/Shunts: No atrial level shunt detected by color flow Doppler.  LEFT VENTRICLE PLAX 2D LVIDd:         4.30 cm   Diastology LVIDs:         2.90 cm   LV e' medial:    4.24 cm/s LV PW:         1.40 cm   LV E/e' medial:  20.8 LV IVS:        1.30 cm   LV e' lateral:   5.87 cm/s LVOT diam:     2.00 cm   LV E/e' lateral: 15.0 LV SV:         73 LV SV Index:   32 LVOT Area:     3.14 cm  RIGHT VENTRICLE RV Basal diam:  3.30 cm TAPSE (M-mode): 2.6 cm LEFT ATRIUM             Index        RIGHT ATRIUM           Index LA diam:        4.10 cm 1.78 cm/m   RA Area:     14.20 cm LA Vol (A2C):   62.8 ml 27.28 ml/m  RA Volume:   32.70 ml  14.21 ml/m LA Vol (A4C):   44.4 ml 19.29 ml/m LA Biplane Vol:  53.5 ml 23.24 ml/m  AORTIC VALVE                    PULMONIC VALVE AV Area (Vmax):    2.46 cm     PV Vmax:       0.97 m/s AV Area (Vmean):   2.24 cm     PV Vmean:      70.300 cm/s AV Area (VTI):     2.58 cm     PV VTI:        0.255 m AV Vmax:           129.00 cm/s  PV Peak grad:  3.7 mmHg AV Vmean:          86.000 cm/s  PV Mean grad:  2.0 mmHg AV VTI:            0.284 m AV Peak Grad:      6.7 mmHg AV Mean Grad:      3.0 mmHg LVOT Vmax:         101.00 cm/s LVOT Vmean:        61.400 cm/s LVOT VTI:          0.233 m LVOT/AV VTI ratio: 0.82 AI PHT:            805 msec  AORTA Ao Root diam: 3.40 cm MITRAL VALVE MV Area (PHT): 3.05 cm      SHUNTS MV Decel Time: 249 msec     Systemic VTI:  0.23 m MV E velocity: 88.00 cm/s   Systemic Diam: 2.00 cm MV A velocity: 107.00 cm/s MV E/A ratio:  0.82 Shaukat Khan Electronically signed by Neoma Laming Signature Date/Time: 12/18/2022/1:59:11 PM    Final    US Venous Img Lower Bilateral (DVT)  Result Date: 12/18/2022 CLINICAL DATA:  Elevated D-dimer.  Evaluate for DVT. EXAM: BILATERAL LOWER EXTREMITY VENOUS DOPPLER ULTRASOUND TECHNIQUE: Gray-scale sonography with graded compression, as well as color Doppler and duplex ultrasound were performed to evaluate the lower extremity deep venous systems from the level of the common femoral vein and including the common femoral, femoral, profunda femoral, popliteal and calf veins including the posterior tibial, peroneal and gastrocnemius veins when visible. The superficial great saphenous vein was also interrogated. Spectral Doppler was utilized to evaluate flow at rest and with distal augmentation maneuvers in the common femoral, femoral and popliteal veins. COMPARISON:  None Available. FINDINGS: RIGHT LOWER EXTREMITY Common Femoral Vein: No evidence of thrombus. Normal compressibility, respiratory phasicity and response to augmentation. Saphenofemoral Junction: No evidence of thrombus. Normal compressibility and flow on color Doppler imaging. Profunda Femoral Vein: No evidence of thrombus. Normal compressibility and flow on color Doppler imaging. Femoral Vein: No evidence of thrombus. Normal compressibility, respiratory phasicity and response to augmentation. Popliteal Vein: No evidence of thrombus. Normal compressibility, respiratory phasicity and response to augmentation. Calf Veins: No evidence of thrombus. Normal compressibility and flow on color Doppler imaging. Superficial Great Saphenous Vein: No evidence of thrombus. Normal compressibility. Venous Reflux:  None. Other Findings:  None. LEFT LOWER EXTREMITY Common Femoral Vein: No evidence of thrombus. Normal  compressibility, respiratory phasicity and response to augmentation. Saphenofemoral Junction: No evidence of thrombus. Normal compressibility and flow on color Doppler imaging. Profunda Femoral Vein: No evidence of thrombus. Normal compressibility and flow on color Doppler imaging. Femoral Vein: No evidence of thrombus. Normal compressibility, respiratory phasicity and response to augmentation. Popliteal Vein: No evidence of thrombus. Normal compressibility, respiratory phasicity and response to augmentation. Calf Veins: No evidence  of thrombus. Normal compressibility and flow on color Doppler imaging. Superficial Great Saphenous Vein: No evidence of thrombus. Normal compressibility. Venous Reflux:  None. Other Findings:  None. IMPRESSION: No evidence of deep venous thrombosis in either lower extremity. Electronically Signed   By: Jacqulynn Cadet M.D.   On: 12/18/2022 11:02    Cardiac Studies   TTE 12/18/22 1. Left ventricular ejection fraction, by estimation, is 60 to 65%. The  left ventricle has normal function. The left ventricle has no regional  wall motion abnormalities. There is moderate left ventricular hypertrophy.  Left ventricular diastolic  parameters are consistent with Grade III diastolic dysfunction  (restrictive).   2. Right ventricular systolic function is normal. The right ventricular  size is normal.   3. Left atrial size was moderately dilated.   4. Right atrial size was mild to moderately dilated.   5. The mitral valve is normal in structure. Mild mitral valve  regurgitation. No evidence of mitral stenosis.   6. Tricuspid valve regurgitation is mild to moderate.   7. The aortic valve is calcified. Aortic valve regurgitation is mild to  moderate. Aortic valve sclerosis/calcification is present, without any  evidence of aortic stenosis.   8. The inferior vena cava is normal in size with greater than 50%  respiratory variability, suggesting right atrial pressure of 3 mmHg.    Patient Profile     71 y.o. female with a history of hypertension, depression, bipolar disorder, obesity, schizophrenia, and tubular adenoma of the colon, who is being seen and evaluated of sinus bradycardia and NSVT after cardiac arrest with ROSC achieved.    Assessment & Plan    Asystole, nonsustained VT, occasional Mobitz type 2 on telemetry likely secondary to severe hypothermia  -s/p CPR -in the context of metabolic derangements -remains intubated, but is now opening eyes -Avoid AV nodal blocking agents -Pacer at bedside, hands free pads -echocardiogram reveals normal LVEF -Not currently a candidate for a permanent pace maker -if she continues to have Mobitz type II after meaning recovery will refer to EP for PPM  Acute hypoxic failure likely secondary to pneumonia -remains intubated -more awake today opening eyes -continue to treat PNA -supportive care -vent management per CCM  Metabolic encephalopathy in the setting of severe hypothermia and sepsis -more awake today -neurology following -currently remains on no sedation -EEG completed -supportive care  Sepsis secondary to acute pancreatitis -continued on antibiotics -trend WBC/fever/and cultures -now off of pressors -abnormal LFT's -followed by GI and surgery with both continuing to recommend conservative therapy  Acute kidney injury -serum creatinine 2.46 -monitor urine output -daily bmp -monitor/trend/replete electrolytes as needed -avoid nephrotoxic agents as able     For questions or updates, please contact [place] Please consult [email or web address] for contact info under        Signed, Emmamarie Kluender, NP  12/20/2022, 7:38 AM

## 2022-12-21 DIAGNOSIS — A419 Sepsis, unspecified organism: Secondary | ICD-10-CM | POA: Diagnosis not present

## 2022-12-21 DIAGNOSIS — T68XXXA Hypothermia, initial encounter: Secondary | ICD-10-CM | POA: Diagnosis not present

## 2022-12-21 DIAGNOSIS — T68XXXS Hypothermia, sequela: Secondary | ICD-10-CM | POA: Diagnosis not present

## 2022-12-21 DIAGNOSIS — Z515 Encounter for palliative care: Secondary | ICD-10-CM | POA: Diagnosis not present

## 2022-12-21 DIAGNOSIS — Z7189 Other specified counseling: Secondary | ICD-10-CM

## 2022-12-21 DIAGNOSIS — N179 Acute kidney failure, unspecified: Secondary | ICD-10-CM | POA: Diagnosis not present

## 2022-12-21 DIAGNOSIS — K8511 Biliary acute pancreatitis with uninfected necrosis: Secondary | ICD-10-CM | POA: Diagnosis not present

## 2022-12-21 DIAGNOSIS — J690 Pneumonitis due to inhalation of food and vomit: Secondary | ICD-10-CM | POA: Diagnosis not present

## 2022-12-21 DIAGNOSIS — I4729 Other ventricular tachycardia: Secondary | ICD-10-CM | POA: Diagnosis not present

## 2022-12-21 LAB — COMPREHENSIVE METABOLIC PANEL
ALT: 165 U/L — ABNORMAL HIGH (ref 0–44)
AST: 52 U/L — ABNORMAL HIGH (ref 15–41)
Albumin: 2.7 g/dL — ABNORMAL LOW (ref 3.5–5.0)
Alkaline Phosphatase: 62 U/L (ref 38–126)
Anion gap: 3 — ABNORMAL LOW (ref 5–15)
BUN: 43 mg/dL — ABNORMAL HIGH (ref 8–23)
CO2: 28 mmol/L (ref 22–32)
Calcium: 9.8 mg/dL (ref 8.9–10.3)
Chloride: 113 mmol/L — ABNORMAL HIGH (ref 98–111)
Creatinine, Ser: 1.88 mg/dL — ABNORMAL HIGH (ref 0.44–1.00)
GFR, Estimated: 28 mL/min — ABNORMAL LOW (ref >60)
Glucose, Bld: 73 mg/dL (ref 70–99)
Potassium: 4 mmol/L (ref 3.5–5.1)
Sodium: 144 mmol/L (ref 135–145)
Total Bilirubin: 1.4 mg/dL — ABNORMAL HIGH (ref 0.3–1.2)
Total Protein: 5.5 g/dL — ABNORMAL LOW (ref 6.5–8.1)

## 2022-12-21 LAB — CBC WITH DIFFERENTIAL/PLATELET
Abs Immature Granulocytes: 0.02 10*3/uL (ref 0.00–0.07)
Basophils Absolute: 0 10*3/uL (ref 0.0–0.1)
Basophils Relative: 0 %
Eosinophils Absolute: 0 10*3/uL (ref 0.0–0.5)
Eosinophils Relative: 0 %
HCT: 32.2 % — ABNORMAL LOW (ref 36.0–46.0)
Hemoglobin: 10 g/dL — ABNORMAL LOW (ref 12.0–15.0)
Immature Granulocytes: 0 %
Lymphocytes Relative: 22 %
Lymphs Abs: 1.3 10*3/uL (ref 0.7–4.0)
MCH: 27.9 pg (ref 26.0–34.0)
MCHC: 31.1 g/dL (ref 30.0–36.0)
MCV: 89.7 fL (ref 80.0–100.0)
Monocytes Absolute: 0.5 10*3/uL (ref 0.1–1.0)
Monocytes Relative: 9 %
Neutro Abs: 3.9 10*3/uL (ref 1.7–7.7)
Neutrophils Relative %: 69 %
Platelets: 56 10*3/uL — ABNORMAL LOW (ref 150–400)
RBC: 3.59 MIL/uL — ABNORMAL LOW (ref 3.87–5.11)
RDW: 14.8 % (ref 11.5–15.5)
WBC: 5.7 10*3/uL (ref 4.0–10.5)
nRBC: 0.7 % — ABNORMAL HIGH (ref 0.0–0.2)

## 2022-12-21 LAB — GLUCOSE, CAPILLARY
Glucose-Capillary: 70 mg/dL (ref 70–99)
Glucose-Capillary: 75 mg/dL (ref 70–99)
Glucose-Capillary: 75 mg/dL (ref 70–99)
Glucose-Capillary: 77 mg/dL (ref 70–99)
Glucose-Capillary: 79 mg/dL (ref 70–99)
Glucose-Capillary: 91 mg/dL (ref 70–99)

## 2022-12-21 LAB — PHOSPHORUS: Phosphorus: 3.7 mg/dL (ref 2.5–4.6)

## 2022-12-21 LAB — MAGNESIUM: Magnesium: 2.5 mg/dL — ABNORMAL HIGH (ref 1.7–2.4)

## 2022-12-21 MED ORDER — DEXTROSE-NACL 5-0.45 % IV SOLN: INTRAVENOUS | Status: DC

## 2022-12-21 MED ORDER — HYDROCORTISONE SOD SUC (PF) 100 MG IJ SOLR
25.0000 mg | Freq: Two times a day (BID) | INTRAMUSCULAR | Status: AC
  Administered 2022-12-22 (×2): 25 mg via INTRAVENOUS
  Filled 2022-12-21 (×2): qty 2

## 2022-12-21 MED FILL — Medication: Qty: 1 | Status: AC

## 2022-12-21 NOTE — Progress Notes (Signed)
Nutrition Follow-up  DOCUMENTATION CODES:   Obesity unspecified  INTERVENTION:   -Await SLP eval and following for diet advancement; add supplements as appropriate -If pt unable to take PO's, consider enteral nutrition support:  Initiate Vital 1.5 @ 20 ml/hr and increase by 10 ml every 8 hours to goal rate of 50 ml/hr.   60 ml Prosource TF BID.  165 ml free water flush every 4 hours    Tube feeding regimen provides 1960 kcal (100% of needs), 121 grams of protein, and 917 ml of H2O. Total free water: 1907 ml daily    NUTRITION DIAGNOSIS:   Inadequate oral intake related to inability to eat as evidenced by NPO status.  Ongoing  GOAL:   Patient will meet greater than or equal to 90% of their needs  Unmet  MONITOR:   Diet advancement  REASON FOR ASSESSMENT:   Ventilator    ASSESSMENT:   71 y/o female with h/o schizophrenia, depression, bipolar disorder, HTN, CKD and DJD who is admitted with  hypothermia, AMS, sepsis, acute pancreatitis with early peripancreatic necrosis, cholelithiasis and large gallstone in the neck of the gallbladder and, AKI and pneumonia complicated by cardiac arrest.  12/21- extubated  Reviewed I/O's: +110 ml x 24 hours and +8 L since admission  UOP: 1 L x 24 hours   Per palliative care notes, pt with wet cough and unable to clear her secretions. Pt desires full scope care.   Pt lying in bed at time of visit. She did not arouse to voice. Pt with no enteral feeding access.   Case discussed with MD. Pt remains NPO. Per MD, pt awaiting SLP evaluation.   Medications reviewed and include sol-cortef and 0.9% sodium chloride infusion @ 10 ml/hr.   Labs reviewed: CBGS: 70-75 (inpatient orders for glycemic control are none).    Diet Order:   Diet Order             Diet NPO time specified  Diet effective now                   EDUCATION NEEDS:   No education needs have been identified at this time  Skin:  Skin Assessment: Skin  Integrity Issues: Skin Integrity Issues:: Stage II Stage II: lt foot  Last BM:  12/21/22 (type 6)  Height:   Ht Readings from Last 1 Encounters:  12/18/22 '5\' 9"'$  (1.753 m)    Weight:   Wt Readings from Last 1 Encounters:  12/20/22 123.7 kg    Ideal Body Weight:  65.9 kg  BMI:  Body mass index is 40.27 kg/m.  Estimated Nutritional Needs:   Kcal:  1950-2150  Protein:  115-130 grams  Fluid:  > 1.9 L    Loistine Chance, RD, LDN, [place] Registered Dietitian II Certified Diabetes Care and Education Specialist Please refer to [facility] for RD and/or RD on-call/weekend/after hours pager

## 2022-12-21 NOTE — Progress Notes (Signed)
Palliative: Jill Smith is lying quietly in bed.  She appears acutely/chronically ill.  She will open her eyes, making and briefly keeping eye contact.  She is able to mouth her name, and that we are in the hospital.  She has a weak voice.  I do not believe that she can make her basic Smith known.  There is no family at bedside at this time.  Bedside nursing staff shares that a family member was present earlier in the morning.   Jill Smith has a wet cough.  She is unable to clear her secretions.  She allows me to deep suction without gagging.  Copious oral/pharyngeal secretions removed.  I encouraged Jill Smith to cough frequently to which she nods affirmatively.  Face-to-face conference with bedside nursing staff.   Plan:   Continue full scope/full code.  Time for outcomes.  Anticipate need for rehab.  25 minutes Quinn Axe, NP Palliative medicine team Team phone [phone] Greater than 50% of this time was spent counseling and coordinating care related to the above assessment and plan.

## 2022-12-21 NOTE — Progress Notes (Signed)
SLP Cancellation Note  Patient Details Name: Jill Smith MRN: [id number] DOB: 02/07/1951   Cancelled treatment:       Reason Eval/Treat Not Completed: Medical issues which prohibited therapy;Patient not medically ready (chart reviewed; consulted NSG re: pt's status this PM) Upon observation of pt, in room w/ NSG present, pt was awake w/ eyes open. Noted slight+ open-mouth posture and tongue forward positioning. When NSG instructed pt to stick out tongue, it was midline. However, when pt was instructed to swallow, lingual protrusion was noted. This SLP also assessed wet respirations at rest -- noted Palliative Care note earlier in which oropharyngeal secretions were suctioned from pt w/ reduced gag response noted.   Discussed w/ NSG and updated MD that pt did not appear appropriate today for BSE. ST services will f/u tomorrow w/ further assessment and determination of safety for an oral diet. Recommended ongoing oral care for hygiene and stimulation of swallowing. NSG agreed.      Orinda Kenner, [place], CCC-SLP Speech Language Pathologist Rehab Services; [place] [phone] (ascom) Kijana Estock 12/21/2022, 5:39 PM

## 2022-12-21 NOTE — Progress Notes (Signed)
Rounding Note    Patient Name: Jill Smith Date of Encounter: 12/21/2022  [place] Cardiologist: None  New Consult done by Dr. Garen Lah Subjective   Patient seen on AM rounds. Extubated yesterday(12/20/22). Opens eyes to verbal stimuli. Not following commands or answering questions during exam. Remains off of pressors.  Inpatient Medications    Scheduled Meds:  Chlorhexidine Gluconate Cloth  6 each Topical Q0600   heparin injection (subcutaneous)  5,000 Units Subcutaneous Q12H   hydrocortisone sod succinate (SOLU-CORTEF) inj  50 mg Intravenous Q12H   nystatin   Topical TID   mouth rinse  15 mL Mouth Rinse Q2H   pantoprazole (PROTONIX) IV  40 mg Intravenous Daily   Continuous Infusions:  sodium chloride 10 mL/hr at 12/21/22 0600   dextrose 5 % and 0.45% NaCl 50 mL/hr at 12/21/22 0600   norepinephrine (LEVOPHED) Adult infusion Stopped (12/20/22 0549)   PRN Meds: atropine, docusate sodium, fentaNYL (SUBLIMAZE) injection, mouth rinse, polyethylene glycol   Vital Signs    Vitals:   12/21/22 0400 12/21/22 0500 12/21/22 0600 12/21/22 0700  BP: (!) 121/91 (!) 148/88 125/67 122/87  Pulse: (!) 53 (!) 54 (!) 50 99  Resp: '19 12 11 11  '$ Temp: 99 F (37.2 C) 99 F (37.2 C) 99 F (37.2 C) 99.1 F (37.3 C)  TempSrc:      SpO2: 99% 99% 98% 98%  Weight:      Height:        Intake/Output Summary (Last 24 hours) at 12/21/2022 0736 Last data filed at 12/21/2022 0600 Gross per 24 hour  Intake 1055.26 ml  Output 1005 ml  Net 50.26 ml      12/20/2022    5:00 AM 12/19/2022    5:00 AM 12/18/2022    1:43 AM  Last 3 Weights  Weight (lbs) 272 lb 11.3 oz 264 lb 12.4 oz 257 lb 15 oz  Weight (kg) 123.7 kg 120.1 kg 117 kg      Telemetry    Sinus tachycardia to sinus with frequent unifocal PVC's rates of 80-110 - Personally Reviewed  ECG    No new tracings - Personally Reviewed  Physical Exam   GEN: No acute distress.  Ill-appearing Neck: Unable to  assess JVD due to position, lines, and body habitus Cardiac: RRR, no murmurs, rubs, or gallops.  Respiratory: Coarse to auscultation bilaterally.Upper airway noise, respirations remain unlabored at rest on 3L O2 via [place] GI: Soft, nontender, mildly distended  MS: No edema; No deformity. Neuro:  Opens eyes to verbal stimuli Psych: Remains non-verbal  Labs    High Sensitivity Troponin:   Recent Labs  Lab 12/17/22 1411 12/18/22 0122 12/18/22 0442 12/18/22 0836  TROPONINIHS 5 13 22* 29*     Chemistry Recent Labs  Lab 12/19/22 0409 12/19/22 1347 12/20/22 0206 12/21/22 0515  NA 142 143 144 144  K 4.8 4.6 4.2 4.0  CL 112* 112* 111 113*  CO2 '26 25 26 28  '$ GLUCOSE 126* 115* 124* 73  BUN 38* 42* 45* 43*  CREATININE 2.18* 2.35* 2.46* 1.88*  CALCIUM 10.1 10.3 9.9 9.8  MG 2.3 2.3 2.5* 2.5*  PROT 5.3*  --  5.5* 5.5*  ALBUMIN 2.9*  --  2.8* 2.7*  AST 172*  --  82* 52*  ALT 333*  --  239* 165*  ALKPHOS 70  --  61 62  BILITOT 1.1  --  1.3* 1.4*  GFRNONAA 24* 22* 20* 28*  ANIONGAP 4* 6 7 3*  Lipids  Recent Labs  Lab 12/18/22 0122  TRIG 41    Hematology Recent Labs  Lab 12/19/22 0409 12/20/22 1143 12/21/22 0515  WBC 8.2 7.2 5.7  RBC 4.21 3.56* 3.59*  HGB 11.7* 10.1* 10.0*  HCT 37.2 31.3* 32.2*  MCV 88.4 87.9 89.7  MCH 27.8 28.4 27.9  MCHC 31.5 32.3 31.1  RDW 14.4 14.7 14.8  PLT 78* 56* 56*   Thyroid  Recent Labs  Lab 12/17/22 1558  TSH 2.540    BNPNo results for input(s): "BNP", "PROBNP" in the last 168 hours.  DDimer  Recent Labs  Lab 12/18/22 0122 12/18/22 1146  DDIMER 5.07* 4.16*     Radiology    DG Chest Port 1 View  Result Date: 12/20/2022 CLINICAL DATA:  Pulmonary edema. EXAM: PORTABLE CHEST 1 VIEW COMPARISON:  December 17, 2022. FINDINGS: Endotracheal and nasogastric tubes are in grossly good position. Stable cardiomegaly. Right internal jugular catheter is unchanged in position. Lungs are clear. Bony thorax is unremarkable. IMPRESSION: No  active disease. Electronically Signed   By: Marijo Conception M.D.   On: 12/20/2022 08:03   CT CHEST ABDOMEN PELVIS WO CONTRAST  Result Date: 12/19/2022 CLINICAL DATA:  Altered mental status, hypothermia. EXAM: CT CHEST, ABDOMEN AND PELVIS WITHOUT CONTRAST TECHNIQUE: Multidetector CT imaging of the chest, abdomen and pelvis was performed following the standard protocol without IV contrast. RADIATION DOSE REDUCTION: This exam was performed according to the departmental dose-optimization program which includes automated exposure control, adjustment of the mA and/or kV according to patient size and/or use of iterative reconstruction technique. COMPARISON:  December 17, 2022. FINDINGS: CT CHEST FINDINGS Cardiovascular: Atherosclerosis of thoracic aorta is noted without aneurysm formation. Normal cardiac size. No pericardial effusion. Mediastinum/Nodes: Thyroid gland is unremarkable. Endotracheal tube is in grossly good position. Nasogastric tube is seen passing through esophagus and into stomach. No definite adenopathy is noted. Lungs/Pleura: No pneumothorax is noted. Minimal bilateral pleural effusions are noted with mild adjacent subsegmental atelectasis. Mild emphysematous disease is noted. Right upper lobe air space opacity is noted posteriorly suggesting atelectasis or less likely infiltrate. 12 mm opacity is seen in right lung apex most consistent with inflammation. Musculoskeletal: Minimally displaced fractures are seen involving the anterior portion of the bilateral seventh and eighth ribs. CT ABDOMEN PELVIS FINDINGS Hepatobiliary: Cholelithiasis is noted without biliary dilatation. Liver is unremarkable. Pancreas: Mild inflammatory stranding is seen around the pancreas which may be decreased compared to prior exam, consistent with probable pancreatitis. No definite pseudocyst formation is noted. Spleen: Normal in size without focal abnormality. Adrenals/Urinary Tract: Stable mild bilateral adrenal gland  enlargement is noted suggesting adrenal hyperplasia. No hydronephrosis or renal obstruction is noted. No renal or ureteral calculi are noted. Urinary bladder is decompressed secondary to Foley catheter. Stomach/Bowel: Nasogastric tube tip is seen in distal stomach. No definite evidence of bowel obstruction or inflammation. The appendix is unremarkable. Vascular/Lymphatic: Aortic atherosclerosis. No enlarged abdominal or pelvic lymph nodes. Reproductive: Uterus and bilateral adnexa are unremarkable. Other: No definite hernia is noted. Small amount of free fluid is noted in the pelvis which may be related to inflammation or pancreatitis. Musculoskeletal: No acute or significant osseous findings. IMPRESSION: Minimally displaced fractures are seen involving the anterior portions of the bilateral seventh and eighth ribs. Minimal bilateral pleural effusions are noted with mild adjacent subsegmental atelectasis. Mild right upper lobe subsegmental atelectasis or infiltrate is noted. 12 mm subpleural rounded airspace opacity is noted in right lung apex which may represent focal inflammation, but follow-up CT  scan in 3 months is recommended to ensure stability or resolution and rule out neoplasm. Cholelithiasis without inflammation. Decreased inflammatory stranding is noted around the pancreas consistent with mild pancreatitis which may be improved compared to prior exam. No pseudocyst formation is noted. Endotracheal and nasogastric tubes are in grossly good position. Stable mild bilateral adrenal gland enlargement is noted most consistent with adrenal hyperplasia. Small amount of free fluid is noted in the pelvis which may be physiologic, but alternately may be due to pancreatitis or inflammation. Aortic Atherosclerosis (ICD10-I70.0). Electronically Signed   By: Marijo Conception M.D.   On: 12/19/2022 13:05    Cardiac Studies   TTE 12/18/22 1. Left ventricular ejection fraction, by estimation, is 60 to 65%. The  left  ventricle has normal function. The left ventricle has no regional  wall motion abnormalities. There is moderate left ventricular hypertrophy.  Left ventricular diastolic  parameters are consistent with Grade III diastolic dysfunction  (restrictive).   2. Right ventricular systolic function is normal. The right ventricular  size is normal.   3. Left atrial size was moderately dilated.   4. Right atrial size was mild to moderately dilated.   5. The mitral valve is normal in structure. Mild mitral valve  regurgitation. No evidence of mitral stenosis.   6. Tricuspid valve regurgitation is mild to moderate.   7. The aortic valve is calcified. Aortic valve regurgitation is mild to  moderate. Aortic valve sclerosis/calcification is present, without any  evidence of aortic stenosis.   8. The inferior vena cava is normal in size with greater than 50%  respiratory variability, suggesting right atrial pressure of 3 mmHg.   Patient Profile     71 y.o. female with a history of hypertension, depression, bipolar disorder, obesity, schizophrenia, and tubular adenoma of the colon, who is being seen and evaluated for type II heart block and non-sustained VT s/p cardiac arrest with ROSC achieved ans severe hypothermia.  Assessment & Plan    Asystole, nonsustained VT, occasional Mobitz type II heart block on telemetry s/p cardiac arrest likely secondary to severe hypothermia  -s/p CPR -in the context of metabolic derangements -successful extubated 12/20/22 -echocardiogram reveals normal EF -less arrhythmia noted on telemetry -potassium 4, mg+ 2.5 -not currently a candidate for PPM -Will continue to follow to determine if EP work-up is needed -pacing pads and bedside -avoiding AV nodal blocking agents -hypothermia corrected as she is not hyperthermic -using bear hugger as needed  Acute hypoxic respiratory failure likely secondary to pneumonia -continued on antibiotics -extubated  12/20/22 -maintaining oxygen sats on 3L O2 via [place] -CT reveals infiltrates in the RLL and a minimal pleural effusion  Metabolic encephalopathy -more awake today -EEG completed revealed moderate to severe diffuse slowing indicative of global cerebral dysfunction. Epileptiform abnormalities were not seen (study completed while patient was comatose) -opening eyes to verbal stimuli -neurology following -supportive care  Sepsis secondary to acute pancreatitis -continued on antibiotics -continue to trend WBC's/fever/ and cultures -remains off of pressors -abnormal LFT's -GI and surgery have continued to follow, conservative therapy recommended  Acute kidney injury -serum creatinine 1.88, improving -continued on gentle hydration -monitor urine output -daily bmp -monitor/trend/replete electrolytes as needed -avoid nephrotoxic agents where able      For questions or updates, please contact [place] Please consult [email or web address] for contact info under        Signed, Clementina Mareno, NP  12/21/2022, 7:36 AM

## 2022-12-21 NOTE — Progress Notes (Addendum)
Progress Note    Jill Smith  OXB:[id number] DOB: 1951-01-30  DOA: 12/17/2022 [facility]CP: [facility]      Brief Narrative:    Medical records reviewed and are as summarized below:  Jill Smith is a 71 y.o. female with medical history significant for bipolar disorder, schizophrenia, depression, hypertension, obesity, tubular adenoma of colon, who presented to the emergency department on 12/17/2022 with hypothermia and altered mental status.  According to EMS, she was non-verbal and that is not her baseline.  Reportedly, the heater in her home had stopped working on the night of 12/16/2022, and she was found the following morning with altered mental status.  ED course In the emergency room, rectal temperature was 31.8 F. BP 154/87/hr 36/ O2 sats 87% on RA.  Bear hugger applied and pt received 1L of warm saline due to severe hypothermia.  Lab results were: K+ 5.2/calcium 10.4/AST 73/ALT 129/total protein 6.3/wbc 3.2/platelet count 92.  COVID-19/Influenza A&B by PCR negative. EKG with sinus bradycardia. CT head with no acute abnormality (chronic findings noted). CT abdomen/pelvis showed findings of acute pancreatitis with early peripancreatic necrosis. There was also cholelithiasis (8 mm CBD) and a large gallstone in the neck of the gallbladder as well. Small volume ascites and bilateral pleural effusions were noted. There was also adrenal hyperplasia.   She was admitted to the hospital for acute pancreatitis with peripancreatic necrosis.  General surgeon and gastroenterologist were consulted, and they recommended conservative management.  She was treated with empiric IV antibiotics and IV fluids.  She developed asystole cardiac arrest on 12/18/2022,  around 2 AM in the morning.  She was intubated and placed on mechanical ventilation.  In the ICU, she required low-dose vasopressors.   Significant Hospital Events: Including procedures, antibiotic start and stop  dates in addition to other pertinent events   12/18: Pt admitted to ICU with acute metabolic encephalopathy, severe hypothermia (reported heater was nonfunctional at home) and sepsis secondary to pneumonia and acute pancreatitis  12/18: Overnight during rewarming pt became bradycardic and subsequently asystole requiring initiation of ACLS protocol and mechanical intubation prior to [place] 12/19: Pt had another episode of bradycardia followed by transient hypotension requiring continuous levophed gtt. Rewarmed. 12/20: increased intraabdominal pressures (measured at 40 mmHg). Continue rewarming. 12/21: intraabdominal pressure at 19 mmHg this morning. Awake and follows commands - SBT  She was transferred to [facility] hospitalist service on 12/21/2022.   Assessment/Plan:   Principal Problem:   Hypothermia Active Problems:   Acute pancreatitis   Calculus of gallbladder with acute cholecystitis without obstruction   Sepsis (HCC)   Aspiration pneumonia (HCC)   Pressure injury of skin   SIRS (systemic inflammatory response syndrome) (HCC)   AKI (acute kidney injury) (HCC)   AV block, Mobitz 2   Non-traumatic compartment syndrome of abdomen   NSVT (nonsustained ventricular tachycardia) (HCC)   Nutrition Problem: Inadequate oral intake   Signs/Symptoms: NPO status   Body mass index is 40.27 kg/m.  (Morbid obesity)   Acute pancreatitis with early peripancreatic necrosis, suspected aspiration pneumonia: She is off of IV Zosyn. She is NPO.  Continue IV fluids for hydration.   S/p cardiac arrest (asystole) on 12/19, NSVT Mobitz type II second-degree AV block: 2D echo showed EF estimated at 60 to 68%, grade 3 diastolic dysfunction (restrictive), mild MR, mild to moderate TR   Acute hypoxic respiratory failure: S/p extubation on 12/20/2022.  She is on 3 L/min oxygen via [place]   Distributive  shock: She is off vasopressors.  Continue IV hydrocortisone and taper off   AKI: Continue IV fluids for  hydration   Nutrition, s/p hypoglycemia on 12/20/2022: Continue dextrose containing IV fluids.  She was previously on enteral nutrition via orogastric tube when she was intubated.  Consult speech therapist to assess swallowing.   Acute toxic metabolic encephalopathy: continue supportive care EEG showed moderate to severe diffuse slowing indicating global cerebral dysfunction.  No epileptiform abnormalities were seen.   ADDENDUM:  Plan of care was discussed with Jill Smith, sister, over the phone at 5:04 pm today    Diet Order             Diet NPO time specified  Diet effective now                            Consultants: [place] [facility] Gastroenterologist  Procedures: Intubation and mechanical ventilation on 12/18/2022 Right IJ central line on 12/17/2022    Medications:    Chlorhexidine Gluconate Cloth  6 each Topical Q0600   heparin injection (subcutaneous)  5,000 Units Subcutaneous Q12H   hydrocortisone sod succinate (SOLU-CORTEF) inj  50 mg Intravenous Q12H   nystatin   Topical TID   mouth rinse  15 mL Mouth Rinse Q2H   pantoprazole (PROTONIX) IV  40 mg Intravenous Daily   Continuous Infusions:  sodium chloride 10 mL/hr at 12/21/22 0900   dextrose 5 % and 0.45% NaCl 50 mL/hr at 12/21/22 0900     Anti-infectives (From admission, onward)    Start     Dose/Rate Route Frequency Ordered Stop   12/17/22 1600  piperacillin-tazobactam (ZOSYN) IVPB 3.375 g  Status:  Discontinued        3.375 g 12.5 mL/hr over 240 Minutes Intravenous Every 8 hours 12/17/22 1554 12/20/22 1019              Family Communication/Anticipated D/C date and plan/Code Status   DVT prophylaxis: heparin injection 5,000 Units Start: 12/18/22 2200 SCDs Start: 12/17/22 1520     Code Status: Full Code  Family Communication: sister Disposition Plan: Plan to discharge to SNF   Status is: Inpatient Remains inpatient appropriate because:  Encephalopathy       Subjective:   Interval events noted.  She is nonverbal and unable to provide any history.  Levada Dy, RN, was at the bedside  Objective:    Vitals:   12/21/22 0600 12/21/22 0700 12/21/22 0800 12/21/22 0900  BP: 125/67 122/87 (!) 151/88 138/77  Pulse: (!) 50 99 96 100  Resp: '11 11 10 13  '$ Temp: 99 F (37.2 C) 99.1 F (37.3 C) 99.1 F (37.3 C) 99.3 F (37.4 C)  TempSrc:      SpO2: 98% 98% 98% 98%  Weight:      Height:       No data found.   Intake/Output Summary (Last 24 hours) at 12/21/2022 0925 Last data filed at 12/21/2022 0900 Gross per 24 hour  Intake 947.92 ml  Output 1090 ml  Net -142.08 ml   Filed Weights   12/18/22 0143 12/19/22 0500 12/20/22 0500  Weight: 117 kg 120.1 kg 123.7 kg    Exam:  GEN: NAD SKIN: Warm and dry EYES: EOMI ENT: MMM CV: RRR PULM: CTA B ABD: soft, obese, NT, +BS CNS: Alert, non verbal, unable to move extremities EXT: No edema or tenderness   Pressure Injury 12/17/22 Foot Left Stage 2 -  Partial thickness loss of  dermis presenting as a shallow open injury with a red, pink wound bed without slough. (Active)  12/17/22   Location: Foot  Location Orientation: Left  Staging: Stage 2 -  Partial thickness loss of dermis presenting as a shallow open injury with a red, pink wound bed without slough.  Wound Description (Comments):   Present on Admission:   Dressing Type None 12/21/22 0400     Data Reviewed:   I have personally reviewed following labs and imaging studies:  Labs: Labs show the following:   Basic Metabolic Panel: Recent Labs  Lab 12/18/22 1811 12/19/22 0409 12/19/22 1347 12/20/22 0206 12/21/22 0515  NA 142 142 143 144 144  K 4.9 4.8 4.6 4.2 4.0  CL 109 112* 112* 111 113*  CO2 '27 26 25 26 28  '$ GLUCOSE 127* 126* 115* 124* 73  BUN 35* 38* 42* 45* 43*  CREATININE 1.65* 2.18* 2.35* 2.46* 1.88*  CALCIUM 10.2 10.1 10.3 9.9 9.8  MG 2.3 2.3 2.3 2.5* 2.5*  PHOS 3.6 3.7 3.7 3.8 3.7    GFR Estimated Creatinine Clearance: 38.6 mL/min (A) (by C-G formula based on SCr of 1.88 mg/dL (H)). Liver Function Tests: Recent Labs  Lab 12/17/22 1401 12/18/22 0122 12/19/22 0409 12/20/22 0206 12/21/22 0515  AST 73* 370* 172* 82* 52*  ALT 129* 481* 333* 239* 165*  ALKPHOS 78 77 70 61 62  BILITOT 0.8 1.0 1.1 1.3* 1.4*  PROT 6.3* 5.4* 5.3* 5.5* 5.5*  ALBUMIN 3.5 2.9* 2.9* 2.8* 2.7*   Recent Labs  Lab 12/17/22 1558 12/18/22 0122  LIPASE 380* 533*   No results for input(s): "AMMONIA" in the last 168 hours. Coagulation profile Recent Labs  Lab 12/17/22 1401  INR 1.1    CBC: Recent Labs  Lab 12/17/22 1401 12/18/22 0122 12/19/22 0409 12/20/22 1143 12/21/22 0515  WBC 3.2* 8.0 8.2 7.2 5.7  NEUTROABS 2.1  --   --  5.6 3.9  HGB 14.8 12.6 11.7* 10.1* 10.0*  HCT 46.4* 38.9 37.2 31.3* 32.2*  MCV 88.2 86.4 88.4 87.9 89.7  PLT 92* 95* 78* 56* 56*   Cardiac Enzymes: Recent Labs  Lab 12/17/22 1401  CKTOTAL 69   BNP (last 3 results) No results for input(s): "PROBNP" in the last 8760 hours. CBG: Recent Labs  Lab 12/20/22 1833 12/20/22 1932 12/20/22 2310 12/21/22 0325 12/21/22 0726  GLUCAP 124* 105* 82 70 75   D-Dimer: Recent Labs    12/18/22 1146  DDIMER 4.16*   Hgb A1c: No results for input(s): "HGBA1C" in the last 72 hours. Lipid Profile: No results for input(s): "CHOL", "HDL", "LDLCALC", "TRIG", "CHOLHDL", "LDLDIRECT" in the last 72 hours. Thyroid function studies: No results for input(s): "TSH", "T4TOTAL", "T3FREE", "THYROIDAB" in the last 72 hours.  Invalid input(s): "FREET3" Anemia work up: No results for input(s): "VITAMINB12", "FOLATE", "FERRITIN", "TIBC", "IRON", "RETICCTPCT" in the last 72 hours. Sepsis Labs: Recent Labs  Lab 12/17/22 1558 12/18/22 0122 12/18/22 0442 12/18/22 0836 12/19/22 0409 12/20/22 1143 12/21/22 0515  PROCALCITON <0.10 <0.10  --   --  0.11  --   --   WBC  --  8.0  --   --  8.2 7.2 5.7  LATICACIDVEN  --   3.1* 2.4* 1.5 1.1  --   --     Microbiology Recent Results (from the past 240 hour(s))  Blood Culture (routine x 2)     Status: None (Preliminary result)   Collection Time: 12/17/22  2:01 PM   Specimen: BLOOD  Result Value  Ref Range Status   Specimen Description BLOOD RIGHT ANTECUBITAL  Final   Special Requests   Final    BOTTLES DRAWN AEROBIC AND ANAEROBIC Blood Culture adequate volume   Culture   Final    NO GROWTH 4 DAYS Performed at [facility], [address], [place], [place] [address]    Report Status PENDING  Incomplete  Blood Culture (routine x 2)     Status: None (Preliminary result)   Collection Time: 12/17/22  2:01 PM   Specimen: BLOOD  Result Value Ref Range Status   Specimen Description BLOOD LEFT ANTECUBITAL  Final   Special Requests   Final    BOTTLES DRAWN AEROBIC AND ANAEROBIC Blood Culture results may not be optimal due to an inadequate volume of blood received in culture bottles   Culture   Final    NO GROWTH 4 DAYS Performed at [facility], [address], [place], [place] [address]    Report Status PENDING  Incomplete  Resp Panel by RT-PCR (Flu A&B, Covid) Anterior Nasal Swab     Status: None   Collection Time: 12/17/22  2:06 PM   Specimen: Anterior Nasal Swab  Result Value Ref Range Status   SARS Coronavirus 2 by RT PCR NEGATIVE NEGATIVE Final    Comment: (NOTE) SARS-CoV-2 target nucleic acids are NOT DETECTED.  The SARS-CoV-2 RNA is generally detectable in upper respiratory specimens during the acute phase of infection. The lowest concentration of SARS-CoV-2 viral copies this assay can detect is 138 copies/mL. A negative result does not preclude SARS-Cov-2 infection and should not be used as the sole basis for treatment or other patient management decisions. A negative result may occur with  improper specimen collection/handling, submission of specimen other than nasopharyngeal swab, presence of viral mutation(s) within  the areas targeted by this assay, and inadequate number of viral copies(<138 copies/mL). A negative result must be combined with clinical observations, patient history, and epidemiological information. The expected result is Negative.  Fact Sheet for Patients:  EntrepreneurPulse.com.au  Fact Sheet for Healthcare Providers:  IncredibleEmployment.be  This test is no t yet approved or cleared by the [place] FDA and  has been authorized for detection and/or diagnosis of SARS-CoV-2 by FDA under an Emergency Use Authorization (EUA). This EUA will remain  in effect (meaning this test can be used) for the duration of the COVID-19 declaration under Section 564(b)(1) of the Act, 21 U.S.C.section 360bbb-3(b)(1), unless the authorization is terminated  or revoked sooner.       Influenza A by PCR NEGATIVE NEGATIVE Final   Influenza B by PCR NEGATIVE NEGATIVE Final    Comment: (NOTE) The Xpert Xpress SARS-CoV-2/FLU/RSV plus assay is intended as an aid in the diagnosis of influenza from Nasopharyngeal swab specimens and should not be used as a sole basis for treatment. Nasal washings and aspirates are unacceptable for Xpert Xpress SARS-CoV-2/FLU/RSV testing.  Fact Sheet for Patients: EntrepreneurPulse.com.au  Fact Sheet for Healthcare Providers: IncredibleEmployment.be  This test is not yet approved or cleared by the [place] FDA and has been authorized for detection and/or diagnosis of SARS-CoV-2 by FDA under an Emergency Use Authorization (EUA). This EUA will remain in effect (meaning this test can be used) for the duration of the COVID-19 declaration under Section 564(b)(1) of the Act, 21 U.S.C. section 360bbb-3(b)(1), unless the authorization is terminated or revoked.  Performed at [facility], [address], [place], [place] [address]   Urine Culture     Status:  None   Collection Time:  12/17/22  3:58 PM   Specimen: In/Out Cath Urine  Result Value Ref Range Status   Specimen Description   Final    IN/OUT CATH URINE Performed at [facility], [address], [place], [place] [address]    Special Requests   Final    NONE Performed at [facility], [address], [place], [place] [address]    Culture   Final    NO GROWTH Performed at [facility], [place] [address], [place], [place] [address]    Report Status 12/18/2022 FINAL  Final  MRSA Next Gen by PCR, Nasal     Status: None   Collection Time: 12/17/22  9:02 PM   Specimen: Nasal Mucosa; Nasal Swab  Result Value Ref Range Status   MRSA by PCR Next Gen NOT DETECTED NOT DETECTED Final    Comment: (NOTE) The GeneXpert MRSA Assay (FDA approved for NASAL specimens only), is one component of a comprehensive MRSA colonization surveillance program. It is not intended to diagnose MRSA infection nor to guide or monitor treatment for MRSA infections. Test performance is not FDA approved in patients less than 48 years old. Performed at [facility], [address] [place], [place] [address]   Culture, Respiratory w Gram Stain     Status: None   Collection Time: 12/18/22 11:37 AM   Specimen: Tracheal Aspirate; Respiratory  Result Value Ref Range Status   Specimen Description   Final    TRACHEAL ASPIRATE Performed at [facility], [address] [place], [place] [address]    Special Requests   Final    NONE Performed at [facility], [address] [place], [place] [address]    Gram Stain   Final    RARE WBC PRESENT, PREDOMINANTLY PMN RARE BUDDING YEAST SEEN    Culture   Final    FEW Normal respiratory flora-no Staph aureus or Pseudomonas seen Performed at [facility] [address] [place], [place] [address]    Report Status 12/20/2022 FINAL  Final    Procedures and diagnostic studies:  DG Chest Port 1 View  Result Date: 12/20/2022 CLINICAL  DATA:  Pulmonary edema. EXAM: PORTABLE CHEST 1 VIEW COMPARISON:  December 17, 2022. FINDINGS: Endotracheal and nasogastric tubes are in grossly good position. Stable cardiomegaly. Right internal jugular catheter is unchanged in position. Lungs are clear. Bony thorax is unremarkable. IMPRESSION: No active disease. Electronically Signed   By: Marijo Conception M.D.   On: 12/20/2022 08:03   CT CHEST ABDOMEN PELVIS WO CONTRAST  Result Date: 12/19/2022 CLINICAL DATA:  Altered mental status, hypothermia. EXAM: CT CHEST, ABDOMEN AND PELVIS WITHOUT CONTRAST TECHNIQUE: Multidetector CT imaging of the chest, abdomen and pelvis was performed following the standard protocol without IV contrast. RADIATION DOSE REDUCTION: This exam was performed according to the departmental dose-optimization program which includes automated exposure control, adjustment of the mA and/or kV according to patient size and/or use of iterative reconstruction technique. COMPARISON:  December 17, 2022. FINDINGS: CT CHEST FINDINGS Cardiovascular: Atherosclerosis of thoracic aorta is noted without aneurysm formation. Normal cardiac size. No pericardial effusion. Mediastinum/Nodes: Thyroid gland is unremarkable. Endotracheal tube is in grossly good position. Nasogastric tube is seen passing through esophagus and into stomach. No definite adenopathy is noted. Lungs/Pleura: No pneumothorax is noted. Minimal bilateral pleural effusions are noted with mild adjacent subsegmental atelectasis. Mild emphysematous disease is noted. Right upper lobe air space opacity is noted posteriorly  suggesting atelectasis or less likely infiltrate. 12 mm opacity is seen in right lung apex most consistent with inflammation. Musculoskeletal: Minimally displaced fractures are seen involving the anterior portion of the bilateral seventh and eighth ribs. CT ABDOMEN PELVIS FINDINGS Hepatobiliary: Cholelithiasis is noted without biliary dilatation. Liver is unremarkable. Pancreas:  Mild inflammatory stranding is seen around the pancreas which may be decreased compared to prior exam, consistent with probable pancreatitis. No definite pseudocyst formation is noted. Spleen: Normal in size without focal abnormality. Adrenals/Urinary Tract: Stable mild bilateral adrenal gland enlargement is noted suggesting adrenal hyperplasia. No hydronephrosis or renal obstruction is noted. No renal or ureteral calculi are noted. Urinary bladder is decompressed secondary to Foley catheter. Stomach/Bowel: Nasogastric tube tip is seen in distal stomach. No definite evidence of bowel obstruction or inflammation. The appendix is unremarkable. Vascular/Lymphatic: Aortic atherosclerosis. No enlarged abdominal or pelvic lymph nodes. Reproductive: Uterus and bilateral adnexa are unremarkable. Other: No definite hernia is noted. Small amount of free fluid is noted in the pelvis which may be related to inflammation or pancreatitis. Musculoskeletal: No acute or significant osseous findings. IMPRESSION: Minimally displaced fractures are seen involving the anterior portions of the bilateral seventh and eighth ribs. Minimal bilateral pleural effusions are noted with mild adjacent subsegmental atelectasis. Mild right upper lobe subsegmental atelectasis or infiltrate is noted. 12 mm subpleural rounded airspace opacity is noted in right lung apex which may represent focal inflammation, but follow-up CT scan in 3 months is recommended to ensure stability or resolution and rule out neoplasm. Cholelithiasis without inflammation. Decreased inflammatory stranding is noted around the pancreas consistent with mild pancreatitis which may be improved compared to prior exam. No pseudocyst formation is noted. Endotracheal and nasogastric tubes are in grossly good position. Stable mild bilateral adrenal gland enlargement is noted most consistent with adrenal hyperplasia. Small amount of free fluid is noted in the pelvis which may be  physiologic, but alternately may be due to pancreatitis or inflammation. Aortic Atherosclerosis (ICD10-I70.0). Electronically Signed   By: Marijo Conception M.D.   On: 12/19/2022 13:05               LOS: 4 days   Laron Angelini  [facility]   Pager on [email or web address]. If 7PM-7AM, please contact night-coverage at [email or web address]     12/21/2022, 9:25 AM

## 2022-12-22 DIAGNOSIS — N179 Acute kidney failure, unspecified: Secondary | ICD-10-CM | POA: Diagnosis not present

## 2022-12-22 DIAGNOSIS — E870 Hyperosmolality and hypernatremia: Secondary | ICD-10-CM | POA: Diagnosis not present

## 2022-12-22 DIAGNOSIS — R040 Epistaxis: Secondary | ICD-10-CM | POA: Diagnosis not present

## 2022-12-22 DIAGNOSIS — I469 Cardiac arrest, cause unspecified: Secondary | ICD-10-CM | POA: Diagnosis not present

## 2022-12-22 DIAGNOSIS — K8511 Biliary acute pancreatitis with uninfected necrosis: Secondary | ICD-10-CM | POA: Diagnosis not present

## 2022-12-22 LAB — COMPREHENSIVE METABOLIC PANEL
ALT: 135 U/L — ABNORMAL HIGH (ref 0–44)
AST: 43 U/L — ABNORMAL HIGH (ref 15–41)
Albumin: 2.8 g/dL — ABNORMAL LOW (ref 3.5–5.0)
Alkaline Phosphatase: 65 U/L (ref 38–126)
Anion gap: 4 — ABNORMAL LOW (ref 5–15)
BUN: 40 mg/dL — ABNORMAL HIGH (ref 8–23)
CO2: 30 mmol/L (ref 22–32)
Calcium: 9.7 mg/dL (ref 8.9–10.3)
Chloride: 115 mmol/L — ABNORMAL HIGH (ref 98–111)
Creatinine, Ser: 1.47 mg/dL — ABNORMAL HIGH (ref 0.44–1.00)
GFR, Estimated: 38 mL/min — ABNORMAL LOW (ref >60)
Glucose, Bld: 90 mg/dL (ref 70–99)
Potassium: 4.3 mmol/L (ref 3.5–5.1)
Sodium: 149 mmol/L — ABNORMAL HIGH (ref 135–145)
Total Bilirubin: 1.5 mg/dL — ABNORMAL HIGH (ref 0.3–1.2)
Total Protein: 5.5 g/dL — ABNORMAL LOW (ref 6.5–8.1)

## 2022-12-22 LAB — CBC WITH DIFFERENTIAL/PLATELET
Abs Immature Granulocytes: 0.01 10*3/uL (ref 0.00–0.07)
Basophils Absolute: 0 10*3/uL (ref 0.0–0.1)
Basophils Relative: 0 %
Eosinophils Absolute: 0 10*3/uL (ref 0.0–0.5)
Eosinophils Relative: 1 %
HCT: 31.5 % — ABNORMAL LOW (ref 36.0–46.0)
Hemoglobin: 9.8 g/dL — ABNORMAL LOW (ref 12.0–15.0)
Immature Granulocytes: 0 %
Lymphocytes Relative: 28 %
Lymphs Abs: 1.2 10*3/uL (ref 0.7–4.0)
MCH: 28.7 pg (ref 26.0–34.0)
MCHC: 31.1 g/dL (ref 30.0–36.0)
MCV: 92.1 fL (ref 80.0–100.0)
Monocytes Absolute: 0.6 10*3/uL (ref 0.1–1.0)
Monocytes Relative: 12 %
Neutro Abs: 2.7 10*3/uL (ref 1.7–7.7)
Neutrophils Relative %: 59 %
Platelets: 55 10*3/uL — ABNORMAL LOW (ref 150–400)
RBC: 3.42 MIL/uL — ABNORMAL LOW (ref 3.87–5.11)
RDW: 14.6 % (ref 11.5–15.5)
WBC: 4.5 10*3/uL (ref 4.0–10.5)
nRBC: 0.7 % — ABNORMAL HIGH (ref 0.0–0.2)

## 2022-12-22 LAB — GLUCOSE, CAPILLARY
Glucose-Capillary: 91 mg/dL (ref 70–99)
Glucose-Capillary: 81 mg/dL (ref 70–99)
Glucose-Capillary: 98 mg/dL (ref 70–99)
Glucose-Capillary: 99 mg/dL (ref 70–99)
Glucose-Capillary: 102 mg/dL — ABNORMAL HIGH (ref 70–99)
Glucose-Capillary: 115 mg/dL — ABNORMAL HIGH (ref 70–99)

## 2022-12-22 LAB — PROTIME-INR
INR: 1.3 — ABNORMAL HIGH (ref 0.8–1.2)
Prothrombin Time: 15.7 seconds — ABNORMAL HIGH (ref 11.4–15.2)

## 2022-12-22 LAB — CULTURE, BLOOD (ROUTINE X 2)
Culture: NO GROWTH
Culture: NO GROWTH
Special Requests: ADEQUATE

## 2022-12-22 LAB — HEMOGLOBIN AND HEMATOCRIT, BLOOD
HCT: 31.5 % — ABNORMAL LOW (ref 36.0–46.0)
HCT: 33.4 % — ABNORMAL LOW (ref 36.0–46.0)
Hemoglobin: 9.9 g/dL — ABNORMAL LOW (ref 12.0–15.0)
Hemoglobin: 10.3 g/dL — ABNORMAL LOW (ref 12.0–15.0)

## 2022-12-22 LAB — MAGNESIUM: Magnesium: 2.7 mg/dL — ABNORMAL HIGH (ref 1.7–2.4)

## 2022-12-22 LAB — PHOSPHORUS: Phosphorus: 3.6 mg/dL (ref 2.5–4.6)

## 2022-12-22 MED ORDER — LABETALOL HCL 5 MG/ML IV SOLN: 5.0000 mg | INTRAVENOUS | Status: DC | PRN

## 2022-12-22 MED ORDER — METOPROLOL TARTRATE 50 MG PO TABS
50.0000 mg | ORAL_TABLET | Freq: Two times a day (BID) | ORAL | Status: DC
  Administered 2022-12-22: 50 mg via ORAL
  Filled 2022-12-22: qty 1

## 2022-12-22 MED ORDER — PHENYLEPHRINE HCL 0.25 % NA SOLN
1.0000 | Freq: Once | NASAL | Status: AC
  Administered 2022-12-22: 1 via NASAL

## 2022-12-22 MED ORDER — LABETALOL HCL 5 MG/ML IV SOLN: 10.0000 mg | INTRAVENOUS | Status: DC | PRN

## 2022-12-22 MED ORDER — METOPROLOL TARTRATE 50 MG PO TABS: 50.0000 mg | ORAL_TABLET | Freq: Two times a day (BID) | ORAL | Status: DC

## 2022-12-22 MED ORDER — ISOSORBIDE MONONITRATE ER 60 MG PO TB24
60.0000 mg | ORAL_TABLET | Freq: Every day | ORAL | Status: DC
  Administered 2022-12-22 – 2022-12-29 (×7): 60 mg via ORAL
  Filled 2022-12-22: qty 2
  Filled 2022-12-22 (×2): qty 1
  Filled 2022-12-22 (×2): qty 2
  Filled 2022-12-22: qty 1
  Filled 2022-12-22 (×2): qty 2

## 2022-12-22 MED ORDER — LABETALOL HCL 5 MG/ML IV SOLN
10.0000 mg | INTRAVENOUS | Status: DC | PRN
  Administered 2022-12-22: 10 mg via INTRAVENOUS
  Filled 2022-12-22: qty 4

## 2022-12-22 MED ORDER — DEXTROSE 5 % IV SOLN: INTRAVENOUS | Status: DC

## 2022-12-22 MED ORDER — OXYMETAZOLINE HCL 0.05 % NA SOLN
1.0000 | Freq: Two times a day (BID) | NASAL | Status: AC
  Administered 2022-12-22: 1 via NASAL
  Filled 2022-12-22: qty 15

## 2022-12-22 MED ORDER — PHENYLEPHRINE HCL 0.25 % NA SOLN
1.0000 | Freq: Four times a day (QID) | NASAL | Status: DC | PRN
  Filled 2022-12-22: qty 15

## 2022-12-22 MED ORDER — AMLODIPINE BESYLATE 5 MG PO TABS
5.0000 mg | ORAL_TABLET | Freq: Every day | ORAL | Status: DC
  Administered 2022-12-22: 5 mg via ORAL
  Filled 2022-12-22: qty 1

## 2022-12-22 MED ORDER — PIPERACILLIN-TAZOBACTAM 4.5 G IVPB
4.5000 g | Freq: Three times a day (TID) | INTRAVENOUS | Status: DC
  Administered 2022-12-22 – 2022-12-23 (×2): 4.5 g via INTRAVENOUS
  Filled 2022-12-22 (×4): qty 100

## 2022-12-22 NOTE — Evaluation (Signed)
Clinical/Bedside Swallow Evaluation Patient Details  Name: Jill Smith MRN: [id number] Date of Birth: [birth year]  Today's Date: 12/22/2022 Time: SLP Start Time (ACUTE ONLY): 21 SLP Stop Time (ACUTE ONLY): 1130 SLP Time Calculation (min) (ACUTE ONLY): 50 min  Past Medical History:  Past Medical History:  Diagnosis Date   Bipolar disorder ([place])    Depression    Hypertension    Obesity    Schizophrenia ([place])    Shortness of breath dyspnea    Tubular adenoma of colon    Past Surgical History:  Past Surgical History:  Procedure Laterality Date   COLONOSCOPY WITH PROPOFOL N/A 01/17/2016   Procedure: COLONOSCOPY WITH PROPOFOL;  Surgeon: Lollie Sails, MD;  Location: 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 female with medical history significant for Bipolar disorder, Schizophrenia, depression, hypertension, Obesity, tubular adenoma of colon, who presented to the emergency department on 12/17/2022 with hypothermia and altered mental status.  According to EMS, she was non-verbal and that is not her baseline.  Reportedly, the heater in her home had stopped working on the night of 12/16/2022, and she was found the following morning with altered mental status.  She was admitted to the hospital for severe hypothermia and acute pancreatitis with peripancreatic necrosis.  General surgeon and gastroenterologist were consulted, and they recommended conservative management.  She was treated with empiric IV antibiotics and IV fluids.  She developed asystole cardiac arrest on 12/18/2022,  around 2 AM in the morning.  She was intubated and placed on mechanical ventilation.  In the ICU, she required low-dose vasopressors.  She  was intubated x3 days.    CXR: No active disease.  Head CT: No CT evidence for acute intracranial abnormality. Atrophy and chronic small vessel ischemic changes of the white matter.    Assessment / Plan / Recommendation  Clinical Impression   Pt seen for BSE today. Sister present in room. Pt awake, verbally responsive to 1-2 basic questions though tangential, mumbled response. Suspect degree of Cognitive change/decline-- see Head CT results indicating Atrophy. Pt's family member stated that at home "she won't talk to you if she doesn't want to". Sister stated pt eats "soft foods" at home, even sandwiches w/out Dentures. O2 [place] 2L; afebrile. WBC wnl.  Pt appears to present w/ oropharyngeal phase dysphagia in setting of Psychiatric illness/dxs, acute illness, and suspected, declined Cognitive status. ANY Cognitive decline can impact overall awareness/timing of swallow and safety during po tasks which increases risk for aspiration, choking. Pt's risk for aspiration can be reduced when following general aspiration precautions, given feeding support and Supervision, and using a modified diet consistency of Pureed foods(does not wear Dentures at home per family) w/ Nectar consistency liquids at this time.  She also requires verbal/visual cues for follow through during po tasks and FULL feeding support.        Pt consumed several trials of ice chips, purees, and Nectar liquids via tsp/pinched straw w/ No overt, clinical s/s of aspiration noted except coughing x1/2 trials of ice chips. W/ other consistencies, no decline in vocal quality; no cough, and no decline in respiratory status during/post trials occurred. O2 sats remained 97%. Oral phase was grossly adequate for bolus management,  A-P transit, and oral clearing of the Nectar boluses; however, increased oral phase time and A-P transit was noted w/ Puree boluses. Suspect impact from Cognitive decline(?). Alternating foods/liquids encouraged timely oral phase  bolus management and clearing. Verbal/tactile cues utilized initially.  OM Exam was cursory but appeared [facility] w/ No unilateral lingual/labial weakness noted. Some confusion and/or apraxic-like behavior noted w/ OM tasks and examination and w/ oral care given. She did not attempt to self-feed despite cues.         In setting of acute illness, Baseline Psychiatric illness/dxs w/ Cognitive decline, Edentulous status, dependency feeding, and her risk for aspiration, recommend initiation of the dysphagia level 1(PUREED foods moistened for ease of oral phase and flavor) w/ Nectar liquids; aspiration precautions; reduce Distractions during meals and engage pt during meals for self-feeding. Pills Crushed in Puree for safer swallowing as needed. Support w/ feeding at meals; check for oral clearing as needed.  Barriers/Prognosis Comment: baseline psychiatric illness/dxs; see Head CT results re: Atrophy. MD/NSG updated.  ST services recommends follow w/ Palliative Care for [place] moving forward and education re: impact of any Cognitive decline/illness on swallowing. Precautions posted in room. SLP Visit Diagnosis: Dysphagia, oropharyngeal phase (R13.12) (baseline psychiatric illness/dxs)    Aspiration Risk  Mild aspiration risk;Risk for inadequate nutrition/hydration    Diet Recommendation   dysphagia level 1(PUREED foods moistened for ease of oral phase and flavor) w/ Nectar liquids; aspiration precautions; reduce Distractions during meals and engage pt during meals for self-feeding. Support w/ feeding at meals; check for oral clearing as needed.  Medication Administration: Crushed with puree    Other  Recommendations Recommended Consults:  (Dietician f/u; Palliative Care following for GOC overall) Oral Care Recommendations: Oral care BID;Oral care before and after PO;Staff/trained caregiver to provide oral care Other Recommendations: Order thickener from pharmacy;Prohibited food (jello, ice cream, thin  soups);Remove water pitcher;Have oral suction available    Recommendations for follow up therapy are one component of a multi-disciplinary discharge planning process, led by the attending physician.  Recommendations may be updated based on patient status, additional functional criteria and insurance authorization.  Follow up Recommendations Skilled nursing-short term rehab (<3 hours/day) (TBD)      Assistance Recommended at Discharge  Full assistance and Supervision at meals  Functional Status Assessment Patient has had a recent decline in their functional status and demonstrates the ability to make significant improvements in function in a reasonable and predictable amount of time.  Frequency and Duration min 2x/week  2 weeks       Prognosis Prognosis for Safe Diet Advancement: Fair Barriers to Reach Goals: Cognitive deficits;Language deficits;Time post onset;Severity of deficits;Behavior Barriers/Prognosis Comment: baseline psychiatric illness/dxs; see Head CT results      Swallow Study   General Date of Onset: 12/17/22 HPI: Pt is a 71 y.o. female with medical history significant for Bipolar disorder, Schizophrenia, depression, hypertension, Obesity, tubular adenoma of colon, who presented to the emergency department on 12/17/2022 with hypothermia and altered mental status.  According to EMS, she was non-verbal and that is not her baseline.  Reportedly, the heater in her home had stopped working on the night of 12/16/2022, and she was found the following morning with altered mental status.  She was admitted to the hospital for severe hypothermia and acute pancreatitis with peripancreatic necrosis.  General surgeon and gastroenterologist were consulted, and they recommended conservative management.  She was treated with empiric IV antibiotics and IV fluids.  She developed asystole cardiac arrest on 12/18/2022,  around 2 AM in the morning.  She was intubated and placed on mechanical  ventilation.  In the ICU, she required low-dose vasopressors.  She was intubated x3 days.   CXR: No active disease.  Head CT: No CT evidence for acute intracranial abnormality. Atrophy and  chronic small vessel ischemic changes of the white matter. Type of Study: Bedside Swallow Evaluation Previous Swallow Assessment: none Diet Prior to this Study: NPO Temperature Spikes Noted: No (wbc 4.5) Respiratory Status: Nasal cannula (2L) History of Recent Intubation: Yes Length of Intubations (days): 3 days Date extubated: 12/20/22 Behavior/Cognition: Alert;Cooperative;Pleasant mood;Confused;Distractible;Requires cueing;Doesn't follow directions (baseline psychiatric illness/dxs) Oral Cavity Assessment: Within Functional Limits Oral Care Completed by SLP: Yes Oral Cavity - Dentition: Edentulous Vision:  (n/a) Self-Feeding Abilities: Total assist (did not attmept) Patient Positioning: Upright in bed (needed full assistance) Baseline Vocal Quality: Low vocal intensity (few words only) Volitional Cough: Cognitively unable to elicit Volitional Swallow: Unable to elicit    Oral/Motor/Sensory Function Overall Oral Motor/Sensory Function: Within functional limits (no unilateral weakness noted -- slight open-mouth posture at rest but holding tongue in mouth(no overt protrusion))   Ice Chips Ice chips: Impaired Presentation: Spoon (fed; 2 trials) Oral Phase Impairments: Poor awareness of bolus Pharyngeal Phase Impairments: Cough - Immediate (x1/2)   Thin Liquid Thin Liquid: Not tested    Nectar Thick Nectar Thick Liquid: Within functional limits Presentation: Spoon;Straw (pinched; 3 via tsp then ~4 ozs via straw) Other Comments: required feeding, cue d/t slow A-P transit/hold x1   Honey Thick Honey Thick Liquid: Not tested   Puree Puree: Impaired Presentation: Spoon (fed; 10+ trials) Oral Phase Impairments: Poor awareness of bolus Oral Phase Functional Implications: Prolonged oral transit Pharyngeal  Phase Impairments:  (none) Other Comments: verbal cues/time given to swallow and clear   Solid     Solid: Not tested         Orinda Kenner, MS, CCC-SLP Speech Language Pathologist Rehab Services; [place] [phone] (ascom) Zohal Reny 12/22/2022,1:25 PM

## 2022-12-22 NOTE — Progress Notes (Signed)
       CROSS COVER NOTE  NAME: Jill Smith MRN: [id number] DOB : 07-15-1951    HPI/Events of Note   Nursing reports patient with significant nose bleed  Assessment and  Interventions   Assessment: H & H stable Plan: Stat P T INR Recheck  H & H  at noon Treated at bedside one time dose afrin nasal spray       Kathlene Cote NP [facility]

## 2022-12-22 NOTE — Progress Notes (Addendum)
Progress Note    Jill Smith  ENI:[id number] DOB: May 18, 1951  DOA: 12/17/2022 [facility]CP: [facility]      Brief Narrative:    Medical records reviewed and are as summarized below:  Jill Smith is a 71 y.o. female with medical history significant for bipolar disorder, schizophrenia, depression, hypertension, obesity, tubular adenoma of colon, who presented to the emergency department on 12/17/2022 with hypothermia and altered mental status.  According to EMS, she was non-verbal and that is not her baseline.  Reportedly, the heater in her home had stopped working on the night of 12/16/2022, and she was found the following morning with altered mental status.  ED course In the emergency room, rectal temperature was 31.8 F. BP 154/87/hr 36/ O2 sats 87% on RA.  Bear hugger applied and pt received 1L of warm saline due to severe hypothermia.  Lab results were: K+ 5.2/calcium 10.4/AST 73/ALT 129/total protein 6.3/wbc 3.2/platelet count 92.  COVID-19/Influenza A&B by PCR negative. EKG with sinus bradycardia. CT head with no acute abnormality (chronic findings noted). CT abdomen/pelvis showed findings of acute pancreatitis with early peripancreatic necrosis. There was also cholelithiasis (8 mm CBD) and a large gallstone in the neck of the gallbladder as well. Small volume ascites and bilateral pleural effusions were noted. There was also adrenal hyperplasia.   She was admitted to the hospital for acute pancreatitis with peripancreatic necrosis.  General surgeon and gastroenterologist were consulted, and they recommended conservative management.  She was treated with empiric IV antibiotics and IV fluids.  She developed asystole cardiac arrest on 12/18/2022,  around 2 AM in the morning.  She was intubated and placed on mechanical ventilation.  In the ICU, she required low-dose vasopressors.   Significant Hospital Events: Including procedures, antibiotic start and stop  dates in addition to other pertinent events   12/18: Pt admitted to ICU with acute metabolic encephalopathy, severe hypothermia (reported heater was nonfunctional at home) and sepsis secondary to pneumonia and acute pancreatitis  12/18: Overnight during rewarming pt became bradycardic and subsequently asystole requiring initiation of ACLS protocol and mechanical intubation prior to [place] 12/19: Pt had another episode of bradycardia followed by transient hypotension requiring continuous levophed gtt. Rewarmed. 12/20: increased intraabdominal pressures (measured at 40 mmHg). Continue rewarming. 12/21: intraabdominal pressure at 19 mmHg this morning. Awake and follows commands - SBT  She was transferred to [facility] hospitalist service on 12/21/2022.   Assessment/Plan:   Principal Problem:   Hypothermia Active Problems:   Acute pancreatitis   Cardiac arrest (HCC)   Calculus of gallbladder with acute cholecystitis without obstruction   Sepsis ([place])   Aspiration pneumonia (HCC)   Pressure injury of skin   SIRS (systemic inflammatory response syndrome) (HCC)   AKI (acute kidney injury) (HCC)   AV block, Mobitz 2   Non-traumatic compartment syndrome of abdomen   NSVT (nonsustained ventricular tachycardia) (HCC)   Hypernatremia   Nutrition Problem: Inadequate oral intake   Signs/Symptoms: NPO status   Body mass index is 38.12 kg/m.  (Morbid obesity)   Acute pancreatitis with early peripancreatic necrosis, suspected aspiration pneumonia: She is off of IV Zosyn. She is NPO.  Continue IV fluids for hydration.   S/p cardiac arrest (asystole) on 12/19, NSVT Mobitz type II second-degree AV block: 2D echo showed EF estimated at 60 to 61%, grade 3 diastolic dysfunction (restrictive), mild MR, mild to moderate TR   Acute hypoxic respiratory failure: Resolved.  She has been weaned off of  oxygen.  S/p extubation on 12/20/2022.     Distributive shock: She is off vasopressors.  Continue IV  hydrocortisone and taper off   AKI: Improving.  Continue IV fluids.  Monitor BMP.   Hypernatremia, nutrition, s/p hypoglycemia on 12/20/2022: Change IV fluids from 5% dextrose with half-normal saline to 5% dextrose infusion. She was previously on enteral nutrition via orogastric tube when she was intubated.  Speech therapist to assess swallow today.   Acute toxic metabolic encephalopathy: Slowly improving.  Continue supportive care.  EEG showed moderate to severe diffuse slowing indicating global cerebral dysfunction.  No epileptiform abnormalities were seen.  Epistaxis: Resolved.  H&H stable.  Her sister said she has had history of epistaxis in the past.    Diet Order             Diet NPO time specified  Diet effective now                            Consultants: [place] surgery Gastroenterologist  Procedures: Intubation and mechanical ventilation on 12/18/2022 Right IJ central line on 12/17/2022    Medications:    Chlorhexidine Gluconate Cloth  6 each Topical Q0600   heparin injection (subcutaneous)  5,000 Units Subcutaneous Q12H   hydrocortisone sod succinate (SOLU-CORTEF) inj  25 mg Intravenous BID   nystatin   Topical TID   mouth rinse  15 mL Mouth Rinse Q2H   pantoprazole (PROTONIX) IV  40 mg Intravenous Daily   Continuous Infusions:  sodium chloride 10 mL/hr at 12/22/22 0600   dextrose 75 mL/hr at 12/22/22 5638     Anti-infectives (From admission, onward)    Start     Dose/Rate Route Frequency Ordered Stop   12/17/22 1600  piperacillin-tazobactam (ZOSYN) IVPB 3.375 g  Status:  Discontinued        3.375 g 12.5 mL/hr over 240 Minutes Intravenous Every 8 hours 12/17/22 1554 12/20/22 1019              Family Communication/Anticipated D/C date and plan/Code Status   DVT prophylaxis: heparin injection 5,000 Units Start: 12/18/22 2200 SCDs Start: 12/17/22 1520     Code Status: Full Code  Family Communication: Jill Smith, sister, at the bedside Disposition Plan: Plan to discharge to SNF   Status is: Inpatient Remains inpatient appropriate because: Encephalopathy       Subjective:   Interval events noted.  Patient is more awake and communicative according to Jill Dy, RN at the bedside.  She had a nosebleed earlier this morning but this has resolved.  Objective:    Vitals:   12/22/22 0400 12/22/22 0452 12/22/22 0500 12/22/22 0600  BP: (!) 150/76  (!) 164/90 (!) 142/93  Pulse: 73 80 86 79  Resp: '11 16 13 10  '$ Temp: (!) 96.8 F (36 C) (!) 96.6 F (35.9 C) (!) 96.6 F (35.9 C) (!) 96.8 F (36 C)  TempSrc:      SpO2: 98% 97% 90% 96%  Weight:  117.1 kg    Height:       No data found.   Intake/Output Summary (Last 24 hours) at 12/22/2022 1057 Last data filed at 12/22/2022 0600 Gross per 24 hour  Intake 1199.1 ml  Output 1135 ml  Net 64.1 ml   Filed Weights   12/19/22 0500 12/20/22 0500 12/22/22 0452  Weight: 120.1 kg 123.7 kg 117.1 kg    Exam:   GEN: NAD SKIN: Warm and dry EYES:  No pallor or icterus ENT: MMM CV: RRR PULM: CTA B ABD: soft, obese, NT, +BS CNS: Alert but confused, attends unintelligible words, moves extremities spontaneously,  EXT: No edema or tenderness.  She has mittens on bilateral hands GU: Foley catheter with amber urine    Pressure Injury 12/17/22 Foot Left Stage 2 -  Partial thickness loss of dermis presenting as a shallow open injury with a red, pink wound bed without slough. (Active)  12/17/22   Location: Foot  Location Orientation: Left  Staging: Stage 2 -  Partial thickness loss of dermis presenting as a shallow open injury with a red, pink wound bed without slough.  Wound Description (Comments):   Present on Admission:   Dressing Type Gauze (Comment) 12/21/22 1945     Data Reviewed:   I have personally reviewed following labs and imaging studies:  Labs: Labs show the following:   Basic Metabolic Panel: Recent Labs  Lab  12/19/22 0409 12/19/22 1347 12/20/22 0206 12/21/22 0515 12/22/22 0459  NA 142 143 144 144 149*  K 4.8 4.6 4.2 4.0 4.3  CL 112* 112* 111 113* 115*  CO2 '26 25 26 28 30  '$ GLUCOSE 126* 115* 124* 73 90  BUN 38* 42* 45* 43* 40*  CREATININE 2.18* 2.35* 2.46* 1.88* 1.47*  CALCIUM 10.1 10.3 9.9 9.8 9.7  MG 2.3 2.3 2.5* 2.5* 2.7*  PHOS 3.7 3.7 3.8 3.7 3.6   GFR Estimated Creatinine Clearance: 48 mL/min (A) (by C-G formula based on SCr of 1.47 mg/dL (H)). Liver Function Tests: Recent Labs  Lab 12/18/22 0122 12/19/22 0409 12/20/22 0206 12/21/22 0515 12/22/22 0459  AST 370* 172* 82* 52* 43*  ALT 481* 333* 239* 165* 135*  ALKPHOS 77 70 61 62 65  BILITOT 1.0 1.1 1.3* 1.4* 1.5*  PROT 5.4* 5.3* 5.5* 5.5* 5.5*  ALBUMIN 2.9* 2.9* 2.8* 2.7* 2.8*   Recent Labs  Lab 12/17/22 1558 12/18/22 0122  LIPASE 380* 533*   No results for input(s): "AMMONIA" in the last 168 hours. Coagulation profile Recent Labs  Lab 12/17/22 1401 12/22/22 0608  INR 1.1 1.3*    CBC: Recent Labs  Lab 12/17/22 1401 12/18/22 0122 12/19/22 0409 12/20/22 1143 12/21/22 0515 12/22/22 0459  WBC 3.2* 8.0 8.2 7.2 5.7 4.5  NEUTROABS 2.1  --   --  5.6 3.9 2.7  HGB 14.8 12.6 11.7* 10.1* 10.0* 9.8*  HCT 46.4* 38.9 37.2 31.3* 32.2* 31.5*  MCV 88.2 86.4 88.4 87.9 89.7 92.1  PLT 92* 95* 78* 56* 56* 55*   Cardiac Enzymes: Recent Labs  Lab 12/17/22 1401  CKTOTAL 69   BNP (last 3 results) No results for input(s): "PROBNP" in the last 8760 hours. CBG: Recent Labs  Lab 12/21/22 1539 12/21/22 1948 12/21/22 2319 12/22/22 0310 12/22/22 0729  GLUCAP 77 79 91 91 81   D-Dimer: No results for input(s): "DDIMER" in the last 72 hours.  Hgb A1c: No results for input(s): "HGBA1C" in the last 72 hours. Lipid Profile: No results for input(s): "CHOL", "HDL", "LDLCALC", "TRIG", "CHOLHDL", "LDLDIRECT" in the last 72 hours. Thyroid function studies: No results for input(s): "TSH", "T4TOTAL", "T3FREE", "THYROIDAB"  in the last 72 hours.  Invalid input(s): "FREET3" Anemia work up: No results for input(s): "VITAMINB12", "FOLATE", "FERRITIN", "TIBC", "IRON", "RETICCTPCT" in the last 72 hours. Sepsis Labs: Recent Labs  Lab 12/17/22 1558 12/18/22 0122 12/18/22 0442 12/18/22 0836 12/19/22 0409 12/20/22 1143 12/21/22 0515 12/22/22 0459  PROCALCITON <0.10 <0.10  --   --  0.11  --   --   --  WBC  --  8.0  --   --  8.2 7.2 5.7 4.5  LATICACIDVEN  --  3.1* 2.4* 1.5 1.1  --   --   --     Microbiology Recent Results (from the past 240 hour(s))  Blood Culture (routine x 2)     Status: None   Collection Time: 12/17/22  2:01 PM   Specimen: BLOOD  Result Value Ref Range Status   Specimen Description BLOOD RIGHT ANTECUBITAL  Final   Special Requests   Final    BOTTLES DRAWN AEROBIC AND ANAEROBIC Blood Culture adequate volume   Culture   Final    NO GROWTH 5 DAYS Performed at [facility], [address] [place], [place] [address]    Report Status 12/22/2022 FINAL  Final  Blood Culture (routine x 2)     Status: None   Collection Time: 12/17/22  2:01 PM   Specimen: BLOOD  Result Value Ref Range Status   Specimen Description BLOOD LEFT ANTECUBITAL  Final   Special Requests   Final    BOTTLES DRAWN AEROBIC AND ANAEROBIC Blood Culture results may not be optimal due to an inadequate volume of blood received in culture bottles   Culture   Final    NO GROWTH 5 DAYS Performed at [facility], [address] [place], [place] [address]    Report Status 12/22/2022 FINAL  Final  Resp Panel by RT-PCR (Flu A&B, Covid) Anterior Nasal Swab     Status: None   Collection Time: 12/17/22  2:06 PM   Specimen: Anterior Nasal Swab  Result Value Ref Range Status   SARS Coronavirus 2 by RT PCR NEGATIVE NEGATIVE Final    Comment: (NOTE) SARS-CoV-2 target nucleic acids are NOT DETECTED.  The SARS-CoV-2 RNA is generally detectable in upper respiratory specimens during the acute phase of  infection. The lowest concentration of SARS-CoV-2 viral copies this assay can detect is 138 copies/mL. A negative result does not preclude SARS-Cov-2 infection and should not be used as the sole basis for treatment or other patient management decisions. A negative result may occur with  improper specimen collection/handling, submission of specimen other than nasopharyngeal swab, presence of viral mutation(s) within the areas targeted by this assay, and inadequate number of viral copies(<138 copies/mL). A negative result must be combined with clinical observations, patient history, and epidemiological information. The expected result is Negative.  Fact Sheet for Patients:  EntrepreneurPulse.com.au  Fact Sheet for Healthcare Providers:  IncredibleEmployment.be  This test is no t yet approved or cleared by the [place] FDA and  has been authorized for detection and/or diagnosis of SARS-CoV-2 by FDA under an Emergency Use Authorization (EUA). This EUA will remain  in effect (meaning this test can be used) for the duration of the COVID-19 declaration under Section 564(b)(1) of the Act, 21 U.S.C.section 360bbb-3(b)(1), unless the authorization is terminated  or revoked sooner.       Influenza A by PCR NEGATIVE NEGATIVE Final   Influenza B by PCR NEGATIVE NEGATIVE Final    Comment: (NOTE) The Xpert Xpress SARS-CoV-2/FLU/RSV plus assay is intended as an aid in the diagnosis of influenza from Nasopharyngeal swab specimens and should not be used as a sole basis for treatment. Nasal washings and aspirates are unacceptable for Xpert Xpress SARS-CoV-2/FLU/RSV testing.  Fact Sheet for Patients: EntrepreneurPulse.com.au  Fact Sheet for Healthcare Providers: IncredibleEmployment.be  This test is not yet approved or cleared by the [place] and has been authorized for  detection and/or diagnosis of SARS-CoV-2  by FDA under an Emergency Use Authorization (EUA). This EUA will remain in effect (meaning this test can be used) for the duration of the COVID-19 declaration under Section 564(b)(1) of the Act, 21 U.S.C. section 360bbb-3(b)(1), unless the authorization is terminated or revoked.  Performed at [facility], [address] [place], [place] [address]   Urine Culture     Status: None   Collection Time: 12/17/22  3:58 PM   Specimen: In/Out Cath Urine  Result Value Ref Range Status   Specimen Description   Final    IN/OUT CATH URINE Performed at [facility], [address], [place], [place] [address]    Special Requests   Final    NONE Performed at [facility], [address], [place], [place] [address]    Culture   Final    NO GROWTH Performed at [facility], [place] [address] [place], [place] [address]    Report Status 12/18/2022 FINAL  Final  MRSA Next Gen by PCR, Nasal     Status: None   Collection Time: 12/17/22  9:02 PM   Specimen: Nasal Mucosa; Nasal Swab  Result Value Ref Range Status   MRSA by PCR Next Gen NOT DETECTED NOT DETECTED Final    Comment: (NOTE) The GeneXpert MRSA Assay (FDA approved for NASAL specimens only), is one component of a comprehensive MRSA colonization surveillance program. It is not intended to diagnose MRSA infection nor to guide or monitor treatment for MRSA infections. Test performance is not FDA approved in patients less than 2 years old. Performed at [facility], [address] [place], [place] [address]   Culture, Respiratory w Gram Stain     Status: None   Collection Time: 12/18/22 11:37 AM   Specimen: Tracheal Aspirate; Respiratory  Result Value Ref Range Status   Specimen Description   Final    TRACHEAL ASPIRATE Performed at [facility], [address] [place], [place] [address]    Special Requests   Final    NONE Performed at [facility], [address] [place], [place] [address]    Gram Stain   Final    RARE WBC PRESENT, PREDOMINANTLY PMN RARE BUDDING YEAST SEEN    Culture   Final    FEW Normal respiratory flora-no Staph aureus or Pseudomonas seen Performed at [facility] [address] [place], [place] [address]    Report Status 12/20/2022 FINAL  Final    Procedures and diagnostic studies:  No results found.             LOS: 5 days   Shundra Wirsing  [facility]   Pager on [email or web address]. If 7PM-7AM, please contact night-coverage at [email or web address]     12/22/2022, 10:57 AM

## 2022-12-22 NOTE — Consult Note (Signed)
Pharmacy Antibiotic Note  Jill Smith is a 71 y.o. female admitted on 12/17/2022 with hypothermia.  Pharmacy has been consulted for aspiration pneumonia dosing. Patient was recently intubated from 12/18 through 12/21.  Plan: Zosyn 3.375g IV q8h (4 hour infusion).  Height: '5\' 9"'$  (175.3 cm) Weight: 117.1 kg (258 lb 2.5 oz) IBW/kg (Calculated) : 66.2  Temp (24hrs), Avg:96.8 F (36 C), Min:96.1 F (35.6 C), Max:97.9 F (36.6 C)  Recent Labs  Lab 12/17/22 1557 12/18/22 0122 12/18/22 0442 12/18/22 0836 12/18/22 1146 12/19/22 0409 12/19/22 1347 12/20/22 0206 12/20/22 1143 12/21/22 0515 12/22/22 0459  WBC  --  8.0  --   --   --  8.2  --   --  7.2 5.7 4.5  CREATININE  --  1.16*  --   --    < > 2.18* 2.35* 2.46*  --  1.88* 1.47*  LATICACIDVEN 1.3 3.1* 2.4* 1.5  --  1.1  --   --   --   --   --    < > = values in this interval not displayed.    Estimated Creatinine Clearance: 48 mL/min (A) (by C-G formula based on SCr of 1.47 mg/dL (H)).    No Known Allergies  Antimicrobials this admission: 12/18 zosyn >> 12/21 12/23 zosyn >>    Microbiology results: 12/18 BCx: NGTD 12/18 UCx: NGTD  12/17 Resp PCR: Negative  12/17 MRSA PCR: Not Detected  Thank you for allowing pharmacy to be a part of this patient's care.  Darrick Penna 12/22/2022 9:02 PM

## 2022-12-22 NOTE — Progress Notes (Addendum)
I was informed by Levada Dy, RN, that patient had developed epistaxis. She had suctioned clots of blood from the oropharynx.  Patient has dried blood on the nose but she is not actively bleeding.  Oropharynx looked bloody but there was no active bleeding noted.  Patient is alert and vital signs are stable.  Will hold next dose of subcutaneous heparin.  H&H is stable.  Repeat H&H if epistaxis recurs.  Consulted Dr.  Pryor Ochoa, ENT specialist, for further evaluation. He said he will place a nose pack

## 2022-12-22 NOTE — Consult Note (Addendum)
Jill Smith, Jill Smith [id number] 1951-11-24 Riley Nearing, MD  Reason for Consult: Recurrent epistaxis Requesting Physician: Jennye Boroughs, MD Consulting Physician: Riley Nearing, MD  HPI: This 71 y.o. year old female was admitted on 12/17/2022 for Hypothermia [T68.XXXA] Hypothermia, initial encounter [T68.XXXA] Acute pancreatitis, unspecified complication status, unspecified pancreatitis type [K85.90] Aspiration pneumonia, unspecified aspiration pneumonia type, unspecified laterality, unspecified part of lung ([place]) [J69.0] Sepsis, due to unspecified organism, unspecified whether acute organ dysfunction present ([place]) [A41.9]. Since extubation she has had a couple of nosebleeds that stopped spontaneously followed by a heavy episode that was controlled by one of the CCU physicians placing bilateral Merocel packs. Talking to him and nursing, there was not a clear side that was bleeding, with most of the blood coming from the mouth. Family had reported a history of some prior issues with epistaxis.  Medications:  Current Facility-Administered Medications  Medication Dose Route Frequency Provider Last Rate Last Admin   0.9 %  sodium chloride infusion  250 mL Intravenous Continuous Lang Snow, NP 10 mL/hr at 12/22/22 1800 Infusion Verify at 12/22/22 1800   amLODipine (NORVASC) tablet 5 mg  5 mg Oral Daily Jennye Boroughs, MD       atropine 1 MG/10ML injection 1 mg  1 mg Intravenous PRN Teressa Lower, NP       Chlorhexidine Gluconate Cloth 2 % PADS 6 each  6 each Topical Q0600 Armando Reichert, MD   6 each at 12/22/22 0554   dextrose 5 % solution   Intravenous Continuous Jennye Boroughs, MD 75 mL/hr at 12/22/22 1800 Infusion Verify at 12/22/22 1800   docusate sodium (COLACE) capsule 100 mg  100 mg Oral BID PRN Teressa Lower, NP       fentaNYL (SUBLIMAZE) injection 25-50 mcg  25-50 mcg Intravenous Q1H PRN Teressa Lower, NP   50 mcg at 12/20/22 0358   heparin injection 5,000 Units  5,000 Units  Subcutaneous Q12H Armando Reichert, MD   5,000 Units at 12/22/22 1136   hydrocortisone sodium succinate (SOLU-CORTEF) 100 MG injection 25 mg  25 mg Intravenous BID Jennye Boroughs, MD   25 mg at 12/22/22 1213   isosorbide mononitrate (IMDUR) 24 hr tablet 60 mg  60 mg Oral Daily Jennye Boroughs, MD       labetalol (NORMODYNE) injection 10 mg  10 mg Intravenous Q4H PRN Jennye Boroughs, MD   10 mg at 12/22/22 1742   metoprolol tartrate (LOPRESSOR) tablet 50 mg  50 mg Oral BID Jennye Boroughs, MD       nystatin (MYCOSTATIN/NYSTOP) topical powder   Topical TID Teressa Lower, NP   Given at 12/22/22 1710   Oral care mouth rinse  15 mL Mouth Rinse Q2H Lang Snow, NP   15 mL at 12/22/22 1600   Oral care mouth rinse  15 mL Mouth Rinse PRN Lang Snow, NP       oxymetazoline (AFRIN) 0.05 % nasal spray 1 spray  1 spray Each Nare BID Jennye Boroughs, MD   1 spray at 12/22/22 1707   pantoprazole (PROTONIX) injection 40 mg  40 mg Intravenous Daily Teressa Lower, NP   40 mg at 12/22/22 1136   polyethylene glycol (MIRALAX / GLYCOLAX) packet 17 g  17 g Oral Daily PRN Teressa Lower, NP      .  Medications Prior to Admission  Medication Sig Dispense Refill   amoxicillin-clavulanate (AUGMENTIN) 875-125 MG tablet Take 1 tablet by mouth 2 (two) times daily.  ciprofloxacin (CIPRO) 500 MG tablet Take 500 mg by mouth 2 (two) times daily.     metoprolol tartrate (LOPRESSOR) 25 MG tablet Take 25 mg by mouth 2 (two) times daily.     NYAMYC powder Apply 1 Application topically 2 (two) times daily.     amLODipine (NORVASC) 5 MG tablet Take 5 mg by mouth daily.     aspirin EC 81 MG tablet Take 81 mg by mouth daily.     docusate sodium (COLACE) 100 MG capsule Take 100 mg by mouth daily as needed for mild constipation.      isosorbide mononitrate (IMDUR) 60 MG 24 hr tablet Take 60 mg by mouth daily.     lisinopril (PRINIVIL,ZESTRIL) 20 MG tablet Take 20 mg by mouth daily.     metoprolol tartrate  (LOPRESSOR) 50 MG tablet Take 50 mg by mouth 2 (two) times daily.      paliperidone (INVEGA SUSTENNA) 234 MG/1.5ML SUSY injection Inject 234 mg into the muscle every 30 (thirty) days.     QUEtiapine (SEROQUEL) 100 MG tablet Take 100 mg by mouth at bedtime.     SIMBRINZA 1-0.2 % SUSP Place 1 drop into the left eye 3 (three) times daily. (Patient not taking: Reported on 12/17/2022)     vitamin E 400 UNIT capsule Take 800 Units by mouth 2 (two) times daily.       Allergies: No Known Allergies  PMH:  Past Medical History:  Diagnosis Date   Bipolar disorder ([place])    Depression    Hypertension    Obesity    Schizophrenia ([place])    Shortness of breath dyspnea    Tubular adenoma of colon     Fam Hx:  Family History  Problem Relation Age of Onset   Diabetes Mother    Hypertension Mother    Diabetes Father    Prostate cancer Father    Breast cancer Neg Hx     Soc Hx:  Social History   Socioeconomic History   Marital status: Legally Separated    Spouse name: Not on file   Number of children: Not on file   Years of education: Not on file   Highest education level: Not on file  Occupational History   Not on file  Tobacco Use   Smoking status: Former    Types: Cigarettes   Smokeless tobacco: Never  Substance and Sexual Activity   Alcohol use: No   Drug use: No   Sexual activity: Not Currently  Other Topics Concern   Not on file  Social History Narrative   Lives at home with her sister.  Walks with a walker at baseline   Social Determinants of Health   Financial Resource Strain: Not on file  Food Insecurity: Not on file  Transportation Needs: Not on file  Physical Activity: Not on file  Stress: Not on file  Social Connections: Not on file  Intimate Partner Violence: Not on file    PSH:  Past Surgical History:  Procedure Laterality Date   COLONOSCOPY WITH PROPOFOL N/A 01/17/2016   Procedure: COLONOSCOPY WITH PROPOFOL;  Surgeon: Lollie Sails, MD;  [facility]  [facility] ENDOSCOPY;  Service: Endoscopy;  Laterality: N/A;   COLONOSCOPY WITH PROPOFOL N/A 02/28/2016   Procedure: COLONOSCOPY WITH PROPOFOL;  Surgeon: Lollie Sails, MD;  Location: [facility] ENDOSCOPY;  Service: Endoscopy;  Laterality: N/A;   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 Procedures since admission: No admission procedures for hospital encounter.  ROS: Review of systems normal other than 12 systems except per HPI.  PHYSICAL EXAM  Vitals: Blood pressure (!) 165/111, pulse 74, temperature (!) 96.4 F (35.8 C), resp. rate 10, height '5\' 9"'$  (1.753 m), weight 117.1 kg, SpO2 100 %.. General: Well-developed, Well-nourished in no acute distress Mood: Mood and affect groggy but cooperative. Orientation: Not alert Vocal Quality: Did not talk during evaluation  head and Face: NCAT. No facial asymmetry. No visible skin lesions. No significant facial scars. No tenderness with sinus percussion. Facial strength normal and symmetric. Ears: External ears with normal landmarks, no lesions. External auditory canals free of infection, cerumen impaction or lesions. Tympanic membranes intact with good landmarks and normal mobility on pneumatic otoscopy. No middle ear effusion. Hearing: Speech reception grossly normal. Nose: External nose normal with midline dorsum and no lesions or deformity. Nasal Cavity is packed with bilateral Merocel packs. No bleeding from nose or down back of throat Oral Cavity/ Oropharynx: Lips are normal with no lesions. Teeth no frank dental caries. Gingiva healthy with no lesions or gingivitis. Oropharynx including tongue, buccal mucosa, floor of mouth, hard and soft palate, uvula and posterior pharynx free of exudates, erythema or lesions with normal symmetry and hydration. No bleeding in throat. Indirect Laryngoscopy/Nasopharyngoscopy: Visualization of the larynx, hypopharynx and nasopharynx is not possible in this setting with routine examination. Neck: Supple and  symmetric with no palpable masses, tenderness or crepitance. The trachea is midline. Thyroid gland is soft, nontender and symmetric with no masses or enlargement. Parotid and submandibular glands are soft, nontender and symmetric, without masses. Lymphatic: Cervical lymph nodes are without palpable lymphadenopathy or tenderness. Respiratory: Normal respiratory effort without labored breathing. Cardiovascular: Carotid pulse shows regular rate and rhythm Neurologic: Cranial Nerves II through XII are grossly intact, not well assessed due to mental status Eyes: Gaze and Ocular Motility are grossly normal. PERRLA. No visible nystagmus.  MEDICAL DECISION MAKING: Data Review:  Results for orders placed or performed during the hospital encounter of 12/17/22 (from the past 48 hour(s))  Glucose, capillary     Status: Abnormal   Collection Time: 12/20/22  6:33 PM  Result Value Ref Range   Glucose-Capillary 124 (H) 70 - 99 mg/dL    Comment: Glucose reference range applies only to samples taken after fasting for at least 8 hours.  Glucose, capillary     Status: Abnormal   Collection Time: 12/20/22  7:32 PM  Result Value Ref Range   Glucose-Capillary 105 (H) 70 - 99 mg/dL    Comment: Glucose reference range applies only to samples taken after fasting for at least 8 hours.  Glucose, capillary     Status: None   Collection Time: 12/20/22 11:10 PM  Result Value Ref Range   Glucose-Capillary 82 70 - 99 mg/dL    Comment: Glucose reference range applies only to samples taken after fasting for at least 8 hours.  Glucose, capillary     Status: None   Collection Time: 12/21/22  3:25 AM  Result Value Ref Range   Glucose-Capillary 70 70 - 99 mg/dL    Comment: Glucose reference range applies only to samples taken after fasting for at least 8 hours.  Comprehensive metabolic panel     Status: Abnormal   Collection Time: 12/21/22  5:15 AM  Result Value Ref Range   Sodium 144 135 - 145 mmol/L   Potassium 4.0 3.5  - 5.1 mmol/L   Chloride 113 (H) 98 - 111 mmol/L   CO2 28 22 - 32  mmol/L   Glucose, Bld 73 70 - 99 mg/dL    Comment: Glucose reference range applies only to samples taken after fasting for at least 8 hours.   BUN 43 (H) 8 - 23 mg/dL   Creatinine, Ser 1.88 (H) 0.44 - 1.00 mg/dL   Calcium 9.8 8.9 - 10.3 mg/dL   Total Protein 5.5 (L) 6.5 - 8.1 g/dL   Albumin 2.7 (L) 3.5 - 5.0 g/dL   AST 52 (H) 15 - 41 U/L   ALT 165 (H) 0 - 44 U/L   Alkaline Phosphatase 62 38 - 126 U/L   Total Bilirubin 1.4 (H) 0.3 - 1.2 mg/dL   GFR, Estimated 28 (L) >60 mL/min    Comment: (NOTE) Calculated using the CKD-EPI Creatinine Equation (2021)    Anion gap 3 (L) 5 - 15    Comment: Performed at [facility], [address] [place], [place] [address]  Magnesium     Status: Abnormal   Collection Time: 12/21/22  5:15 AM  Result Value Ref Range   Magnesium 2.5 (H) 1.7 - 2.4 mg/dL    Comment: Performed at [facility], [address] [place], [place] [address]  Phosphorus     Status: None   Collection Time: 12/21/22  5:15 AM  Result Value Ref Range   Phosphorus 3.7 2.5 - 4.6 mg/dL    Comment: Performed at [facility], [address] [place], [place] [address]  CBC with Differential/Platelet     Status: Abnormal   Collection Time: 12/21/22  5:15 AM  Result Value Ref Range   WBC 5.7 4.0 - 10.5 K/uL   RBC 3.59 (L) 3.87 - 5.11 MIL/uL   Hemoglobin 10.0 (L) 12.0 - 15.0 g/dL   HCT 32.2 (L) 36.0 - 46.0 %   MCV 89.7 80.0 - 100.0 fL   MCH 27.9 26.0 - 34.0 pg   MCHC 31.1 30.0 - 36.0 g/dL   RDW 14.8 11.5 - 15.5 %   Platelets 56 (L) 150 - 400 K/uL    Comment: Immature Platelet Fraction may be clinically indicated, consider ordering this additional test [id number]    nRBC 0.7 (H) 0.0 - 0.2 %   Neutrophils Relative % 69 %   Neutro Abs 3.9 1.7 - 7.7 K/uL   Lymphocytes Relative 22 %   Lymphs Abs 1.3 0.7 - 4.0 K/uL   Monocytes Relative 9 %   Monocytes Absolute 0.5 0.1 - 1.0 K/uL    Eosinophils Relative 0 %   Eosinophils Absolute 0.0 0.0 - 0.5 K/uL   Basophils Relative 0 %   Basophils Absolute 0.0 0.0 - 0.1 K/uL   Immature Granulocytes 0 %   Abs Immature Granulocytes 0.02 0.00 - 0.07 K/uL    Comment: Performed at [facility], [address] [place], [place] [address]  Glucose, capillary     Status: None   Collection Time: 12/21/22  7:26 AM  Result Value Ref Range   Glucose-Capillary 75 70 - 99 mg/dL    Comment: Glucose reference range applies only to samples taken after fasting for at least 8 hours.  Glucose, capillary     Status: None   Collection Time: 12/21/22 11:17 AM  Result Value Ref Range   Glucose-Capillary 75 70 - 99 mg/dL    Comment: Glucose reference range applies only to samples taken after fasting for at least 8 hours.  Glucose, capillary     Status: None   Collection Time: 12/21/22  3:39 PM  Result  Value Ref Range   Glucose-Capillary 77 70 - 99 mg/dL    Comment: Glucose reference range applies only to samples taken after fasting for at least 8 hours.  Glucose, capillary     Status: None   Collection Time: 12/21/22  7:48 PM  Result Value Ref Range   Glucose-Capillary 79 70 - 99 mg/dL    Comment: Glucose reference range applies only to samples taken after fasting for at least 8 hours.  Glucose, capillary     Status: None   Collection Time: 12/21/22 11:19 PM  Result Value Ref Range   Glucose-Capillary 91 70 - 99 mg/dL    Comment: Glucose reference range applies only to samples taken after fasting for at least 8 hours.  Glucose, capillary     Status: None   Collection Time: 12/22/22  3:10 AM  Result Value Ref Range   Glucose-Capillary 91 70 - 99 mg/dL    Comment: Glucose reference range applies only to samples taken after fasting for at least 8 hours.  Comprehensive metabolic panel     Status: Abnormal   Collection Time: 12/22/22  4:59 AM  Result Value Ref Range   Sodium 149 (H) 135 - 145 mmol/L   Potassium 4.3 3.5 - 5.1 mmol/L    Chloride 115 (H) 98 - 111 mmol/L   CO2 30 22 - 32 mmol/L   Glucose, Bld 90 70 - 99 mg/dL    Comment: Glucose reference range applies only to samples taken after fasting for at least 8 hours.   BUN 40 (H) 8 - 23 mg/dL   Creatinine, Ser 1.47 (H) 0.44 - 1.00 mg/dL   Calcium 9.7 8.9 - 10.3 mg/dL   Total Protein 5.5 (L) 6.5 - 8.1 g/dL   Albumin 2.8 (L) 3.5 - 5.0 g/dL   AST 43 (H) 15 - 41 U/L   ALT 135 (H) 0 - 44 U/L   Alkaline Phosphatase 65 38 - 126 U/L   Total Bilirubin 1.5 (H) 0.3 - 1.2 mg/dL   GFR, Estimated 38 (L) >60 mL/min    Comment: (NOTE) Calculated using the CKD-EPI Creatinine Equation (2021)    Anion gap 4 (L) 5 - 15    Comment: Performed at [facility], [address] [place], [place] [address]  Magnesium     Status: Abnormal   Collection Time: 12/22/22  4:59 AM  Result Value Ref Range   Magnesium 2.7 (H) 1.7 - 2.4 mg/dL    Comment: Performed at [facility], [address] [place], [place] [address]  Phosphorus     Status: None   Collection Time: 12/22/22  4:59 AM  Result Value Ref Range   Phosphorus 3.6 2.5 - 4.6 mg/dL    Comment: Performed at [facility], [address] [place], [place] [address]  CBC with Differential/Platelet     Status: Abnormal   Collection Time: 12/22/22  4:59 AM  Result Value Ref Range   WBC 4.5 4.0 - 10.5 K/uL   RBC 3.42 (L) 3.87 - 5.11 MIL/uL   Hemoglobin 9.8 (L) 12.0 - 15.0 g/dL   HCT 31.5 (L) 36.0 - 46.0 %   MCV 92.1 80.0 - 100.0 fL   MCH 28.7 26.0 - 34.0 pg   MCHC 31.1 30.0 - 36.0 g/dL   RDW 14.6 11.5 - 15.5 %   Platelets 55 (L) 150 - 400 K/uL    Comment: Immature Platelet Fraction may be clinically indicated, consider ordering this additional test [id number]  nRBC 0.7 (H) 0.0 - 0.2 %   Neutrophils Relative % 59 %   Neutro Abs 2.7 1.7 - 7.7 K/uL   Lymphocytes Relative 28 %   Lymphs Abs 1.2 0.7 - 4.0 K/uL   Monocytes Relative 12 %   Monocytes Absolute 0.6 0.1 - 1.0 K/uL   Eosinophils Relative  1 %   Eosinophils Absolute 0.0 0.0 - 0.5 K/uL   Basophils Relative 0 %   Basophils Absolute 0.0 0.0 - 0.1 K/uL   Immature Granulocytes 0 %   Abs Immature Granulocytes 0.01 0.00 - 0.07 K/uL    Comment: Performed at [facility], [address], [place], [place] [address]  Protime-INR     Status: Abnormal   Collection Time: 12/22/22  6:08 AM  Result Value Ref Range   Prothrombin Time 15.7 (H) 11.4 - 15.2 seconds   INR 1.3 (H) 0.8 - 1.2    Comment: (NOTE) INR goal varies based on device and disease states. Performed at [facility], [address] [place], [place] [address]   Glucose, capillary     Status: None   Collection Time: 12/22/22  7:29 AM  Result Value Ref Range   Glucose-Capillary 81 70 - 99 mg/dL    Comment: Glucose reference range applies only to samples taken after fasting for at least 8 hours.  Glucose, capillary     Status: None   Collection Time: 12/22/22 11:24 AM  Result Value Ref Range   Glucose-Capillary 98 70 - 99 mg/dL    Comment: Glucose reference range applies only to samples taken after fasting for at least 8 hours.  Hemoglobin and hematocrit, blood     Status: Abnormal   Collection Time: 12/22/22  2:50 PM  Result Value Ref Range   Hemoglobin 9.9 (L) 12.0 - 15.0 g/dL   HCT 31.5 (L) 36.0 - 46.0 %    Comment: Performed at [facility], [address] [place], [place] [address]  Glucose, capillary     Status: None   Collection Time: 12/22/22  3:43 PM  Result Value Ref Range   Glucose-Capillary 99 70 - 99 mg/dL    Comment: Glucose reference range applies only to samples taken after fasting for at least 8 hours.  . No results found..   ASSESSMENT: Recurrent epistaxis. Based on description from physician and nursing staff this appears to have been a posterior nosebleed. It is now controlled with packing. Heparin has been stopped but blood pressure is usually the major factor in these and her BP has been running very high,  particularly around this more severe event.  PLAN: Since bleeding is controlled would leave packs in place. Can be assessed for removal in 5 days. Blood pressure needs to be controlled, as further packing is unlikely to help if BP is this high. I will let the on call ENT (Dr. Sheppard Coil, who is on call tomorrow through the holidays) know about the patient, but I will plan to remove packs neck Thursday.    Riley Nearing, MD 12/22/2022 6:23 PM

## 2022-12-22 NOTE — Progress Notes (Signed)
LATE ENTRY  I had consulted and discussed case with Dr. Richardson Landry, ENT specialist, and not Dr. Pryor Ochoa as mistakenly stated in previous note.

## 2022-12-22 NOTE — Progress Notes (Signed)
Brief Progress Note:  I was informed by patient's nurse, Levada Dy, that patient Jill Smith located in the ICU not under my care had developed significant epistaxis. I presented to the bedside and suctioned blood from the oropharynx. I proceeded with placing a Merocel pack in each nostril with improvement in the bleeding. We have also obtained Rapid Rhino kits to the bedside should they be needed. ENT called by me and informed of the update. Attending of record, Dr. Mal Misty, to be updated.  Armando Reichert, MD [facility] 12/22/2022 6:03 PM

## 2022-12-23 DIAGNOSIS — N179 Acute kidney failure, unspecified: Secondary | ICD-10-CM | POA: Diagnosis not present

## 2022-12-23 DIAGNOSIS — K8511 Biliary acute pancreatitis with uninfected necrosis: Secondary | ICD-10-CM | POA: Diagnosis not present

## 2022-12-23 DIAGNOSIS — I469 Cardiac arrest, cause unspecified: Secondary | ICD-10-CM | POA: Diagnosis not present

## 2022-12-23 DIAGNOSIS — R040 Epistaxis: Secondary | ICD-10-CM | POA: Diagnosis not present

## 2022-12-23 LAB — COMPREHENSIVE METABOLIC PANEL
ALT: 113 U/L — ABNORMAL HIGH (ref 0–44)
AST: 37 U/L (ref 15–41)
Albumin: 2.7 g/dL — ABNORMAL LOW (ref 3.5–5.0)
Alkaline Phosphatase: 65 U/L (ref 38–126)
Anion gap: 6 (ref 5–15)
BUN: 38 mg/dL — ABNORMAL HIGH (ref 8–23)
CO2: 27 mmol/L (ref 22–32)
Calcium: 9.8 mg/dL (ref 8.9–10.3)
Chloride: 113 mmol/L — ABNORMAL HIGH (ref 98–111)
Creatinine, Ser: 1.32 mg/dL — ABNORMAL HIGH (ref 0.44–1.00)
GFR, Estimated: 43 mL/min — ABNORMAL LOW (ref >60)
Glucose, Bld: 123 mg/dL — ABNORMAL HIGH (ref 70–99)
Potassium: 4.5 mmol/L (ref 3.5–5.1)
Sodium: 146 mmol/L — ABNORMAL HIGH (ref 135–145)
Total Bilirubin: 1.4 mg/dL — ABNORMAL HIGH (ref 0.3–1.2)
Total Protein: 5.5 g/dL — ABNORMAL LOW (ref 6.5–8.1)

## 2022-12-23 LAB — GLUCOSE, CAPILLARY
Glucose-Capillary: 112 mg/dL — ABNORMAL HIGH (ref 70–99)
Glucose-Capillary: 118 mg/dL — ABNORMAL HIGH (ref 70–99)
Glucose-Capillary: 132 mg/dL — ABNORMAL HIGH (ref 70–99)
Glucose-Capillary: 88 mg/dL (ref 70–99)
Glucose-Capillary: 89 mg/dL (ref 70–99)
Glucose-Capillary: 92 mg/dL (ref 70–99)

## 2022-12-23 LAB — CBC WITH DIFFERENTIAL/PLATELET
Abs Immature Granulocytes: 0.01 10*3/uL (ref 0.00–0.07)
Basophils Absolute: 0 10*3/uL (ref 0.0–0.1)
Basophils Relative: 0 %
Eosinophils Absolute: 0 10*3/uL (ref 0.0–0.5)
Eosinophils Relative: 0 %
HCT: 30.8 % — ABNORMAL LOW (ref 36.0–46.0)
Hemoglobin: 9.4 g/dL — ABNORMAL LOW (ref 12.0–15.0)
Immature Granulocytes: 0 %
Lymphocytes Relative: 14 %
Lymphs Abs: 0.7 10*3/uL (ref 0.7–4.0)
MCH: 27.8 pg (ref 26.0–34.0)
MCHC: 30.5 g/dL (ref 30.0–36.0)
MCV: 91.1 fL (ref 80.0–100.0)
Monocytes Absolute: 0.5 10*3/uL (ref 0.1–1.0)
Monocytes Relative: 9 %
Neutro Abs: 3.9 10*3/uL (ref 1.7–7.7)
Neutrophils Relative %: 77 %
Platelets: 56 10*3/uL — ABNORMAL LOW (ref 150–400)
RBC: 3.38 MIL/uL — ABNORMAL LOW (ref 3.87–5.11)
RDW: 14.2 % (ref 11.5–15.5)
WBC: 5.1 10*3/uL (ref 4.0–10.5)
nRBC: 0 % (ref 0.0–0.2)

## 2022-12-23 LAB — PHOSPHORUS: Phosphorus: 3.5 mg/dL (ref 2.5–4.6)

## 2022-12-23 LAB — MAGNESIUM: Magnesium: 2.5 mg/dL — ABNORMAL HIGH (ref 1.7–2.4)

## 2022-12-23 MED ORDER — ORAL CARE MOUTH RINSE
15.0000 mL | OROMUCOSAL | Status: DC
  Administered 2022-12-23 – 2022-12-28 (×21): 15 mL via OROMUCOSAL

## 2022-12-23 MED ORDER — ORAL CARE MOUTH RINSE: 15.0000 mL | OROMUCOSAL | Status: DC | PRN

## 2022-12-23 MED ORDER — HYDRALAZINE HCL 20 MG/ML IJ SOLN
10.0000 mg | INTRAMUSCULAR | Status: DC | PRN
  Administered 2022-12-24 – 2022-12-25 (×2): 10 mg via INTRAVENOUS
  Filled 2022-12-23 (×2): qty 1

## 2022-12-23 MED ORDER — AMLODIPINE BESYLATE 10 MG PO TABS
10.0000 mg | ORAL_TABLET | Freq: Every day | ORAL | Status: DC
  Administered 2022-12-23 – 2022-12-29 (×7): 10 mg via ORAL
  Filled 2022-12-23 (×7): qty 1

## 2022-12-23 MED ORDER — PIPERACILLIN-TAZOBACTAM 3.375 G IVPB: 3.3750 g | Freq: Three times a day (TID) | INTRAVENOUS | Status: DC

## 2022-12-23 NOTE — Progress Notes (Addendum)
Progress Note    Jill Smith  BHA:[id number] DOB: 1951/02/22  DOA: 12/17/2022 PCP: [facility] [place]      Brief Narrative:    Medical records reviewed and are as summarized below:  Jill Smith is a 71 y.o. female with medical history significant for bipolar disorder, schizophrenia, depression, hypertension, obesity, tubular adenoma of colon, who presented to the emergency department on 12/17/2022 with hypothermia and altered mental status.  According to EMS, she was non-verbal and that is not her baseline.  Reportedly, the heater in her home had stopped working on the night of 12/16/2022, and she was found the following morning with altered mental status.  ED course In the emergency room, rectal temperature was 31.8 F. BP 154/87/hr 36/ O2 sats 87% on RA.  Bear hugger applied and pt received 1L of warm saline due to severe hypothermia.  Lab results were: K+ 5.2/calcium 10.4/AST 73/ALT 129/total protein 6.3/wbc 3.2/platelet count 92.  COVID-19/Influenza A&B by PCR negative. EKG with sinus bradycardia. CT head with no acute abnormality (chronic findings noted). CT abdomen/pelvis showed findings of acute pancreatitis with early peripancreatic necrosis. There was also cholelithiasis (8 mm CBD) and a large gallstone in the neck of the gallbladder as well. Small volume ascites and bilateral pleural effusions were noted. There was also adrenal hyperplasia.   She was admitted to the hospital for acute pancreatitis with peripancreatic necrosis.  General surgeon and gastroenterologist were consulted, and they recommended conservative management.  She was treated with empiric IV antibiotics and IV fluids.  She developed asystole cardiac arrest on 12/18/2022,  around 2 AM in the morning.  She was intubated and placed on mechanical ventilation.  In the ICU, she required low-dose vasopressors.   Significant Hospital Events: Including procedures, antibiotic start and stop dates  in addition to other pertinent events   12/18: Pt admitted to ICU with acute metabolic encephalopathy, severe hypothermia (reported heater was nonfunctional at home) and sepsis secondary to pneumonia and acute pancreatitis  12/18: Overnight during rewarming pt became bradycardic and subsequently asystole requiring initiation of ACLS protocol and mechanical intubation prior to [place] 12/19: Pt had another episode of bradycardia followed by transient hypotension requiring continuous levophed gtt. Rewarmed. 12/20: increased intraabdominal pressures (measured at 40 mmHg). Continue rewarming. 12/21: intraabdominal pressure at 19 mmHg this morning. Awake and follows commands - SBT  She was transferred to [facility] hospitalist service on 12/21/2022.   Assessment/Plan:   Principal Problem:   Hypothermia Active Problems:   Acute pancreatitis   Cardiac arrest (HCC)   Calculus of gallbladder with acute cholecystitis without obstruction   Sepsis ([place])   Aspiration pneumonia (HCC)   Pressure injury of skin   SIRS (systemic inflammatory response syndrome) (HCC)   AKI (acute kidney injury) (HCC)   AV block, Mobitz 2   Non-traumatic compartment syndrome of abdomen   NSVT (nonsustained ventricular tachycardia) (HCC)   Hypernatremia   Epistaxis   Nutrition Problem: Inadequate oral intake   Signs/Symptoms: NPO status   Body mass index is 39.33 kg/m.  (Morbid obesity)   Acute pancreatitis with early peripancreatic necrosis, suspected aspiration pneumonia: Discontinue IV fluids.  She was started on Zosyn again on 12/22/2022 after she had recurrent epistaxis with concern for aspiration.  However, Zosyn will be discontinued since patient looks clinically stable.  She is tolerating room air.  No fever, cough or shortness of breath. She has been started on  dysphagia 1 diet.   S/p cardiac arrest (asystole) on  12/19, NSVT Mobitz type II second-degree AV block, chronic diastolic CHF: 2D echo showed EF  estimated at 60 to 82%, grade 3 diastolic dysfunction (restrictive), mild MR, mild to moderate TR   Acute hypoxic respiratory failure: Resolved.  She has been weaned off of oxygen.  S/p extubation on 12/20/2022.     Recurrent epistaxis: S/p nose packing on 12/22/2022.  She was evaluated by Dr. Richardson Landry, ENT physician.  Plan to remove nose packing on 12/27/2022.   Hypertension: BP well-controlled.  Resumed amlodipine and isosorbide mononitrate.  Discontinue metoprolol because of recent AV block.  Use IV hydralazine as needed for severe hypertension.   S/p distributive shock: She is off of vasopressors and IV hydrocortisone.     AKI: Improving.  Discontinue IV fluids to avoid fluid overload   Hypernatremia, nutrition, s/p hypoglycemia on 12/20/2022: Improved.  Discontinue IV fluids to avoid fluid overload  Acute toxic metabolic encephalopathy: Slowly improving.  Continue supportive care.  EEG showed moderate to severe diffuse slowing indicating global cerebral dysfunction.  No epileptiform abnormalities were seen.     Diet Order             DIET - DYS 1 Room service appropriate? Yes with Assist; Fluid consistency: Nectar Thick  Diet effective now                            Consultants: [place] surgery Gastroenterologist  Procedures: Intubation and mechanical ventilation on 12/18/2022 Right IJ central line on 12/17/2022    Medications:    amLODipine  10 mg Oral Daily   Chlorhexidine Gluconate Cloth  6 each Topical Q0600   heparin injection (subcutaneous)  5,000 Units Subcutaneous Q12H   isosorbide mononitrate  60 mg Oral Daily   nystatin   Topical TID   mouth rinse  15 mL Mouth Rinse Q2H   oxymetazoline  1 spray Each Nare BID   pantoprazole (PROTONIX) IV  40 mg Intravenous Daily   Continuous Infusions:  sodium chloride 10 mL/hr at 12/23/22 0700     Anti-infectives (From admission, onward)    Start     Dose/Rate Route Frequency Ordered  Stop   12/23/22 2200  piperacillin-tazobactam (ZOSYN) IVPB 3.375 g  Status:  Discontinued        3.375 g 12.5 mL/hr over 240 Minutes Intravenous Every 8 hours 12/23/22 0646 12/23/22 1132   12/22/22 2200  piperacillin-tazobactam (ZOSYN) IVPB 4.5 g  Status:  Discontinued        4.5 g 200 mL/hr over 30 Minutes Intravenous Every 8 hours 12/22/22 2105 12/23/22 1132   12/17/22 1600  piperacillin-tazobactam (ZOSYN) IVPB 3.375 g  Status:  Discontinued        3.375 g 12.5 mL/hr over 240 Minutes Intravenous Every 8 hours 12/17/22 1554 12/20/22 1019              Family Communication/Anticipated D/C date and plan/Code Status   DVT prophylaxis: heparin injection 5,000 Units Start: 12/18/22 2200 SCDs Start: 12/17/22 1520     Code Status: Full Code  Family Communication: [name], sister, at the bedside Disposition Plan: Plan to discharge to SNF   Status is: Inpatient Remains inpatient appropriate because: Encephalopathy       Subjective:   Interval events noted.  Patient is more awake and communicative according to Jill Dy, RN at the bedside.  She had a nosebleed earlier this morning but this has resolved.  Objective:    Vitals:   12/23/22  0930 12/23/22 1000 12/23/22 1030 12/23/22 1100  BP: (!) 149/90 (!) 155/71 130/79 (!) 148/72  Pulse: (!) 54 73 69 66  Resp: (!) 9 (!) 9 (!) 8 12  Temp: (!) 97.2 F (36.2 C) (!) 97.3 F (36.3 C) (!) 97.3 F (36.3 C) (!) 97.3 F (36.3 C)  TempSrc:    Bladder  SpO2: 98% 97% 97% 98%  Weight:      Height:       No data found.   Intake/Output Summary (Last 24 hours) at 12/23/2022 1133 Last data filed at 12/23/2022 1100 Gross per 24 hour  Intake 2006.99 ml  Output 1525 ml  Net 481.99 ml   Filed Weights   12/20/22 0500 12/22/22 0452 12/23/22 0500  Weight: 123.7 kg 117.1 kg 120.8 kg    Exam:   GEN: NAD SKIN: Warm and dry EYES: No pallor or icterus ENT: MMM CV: RRR PULM: CTA B ABD: soft, obese, NT, +BS CNS:  Alert but confused, attends unintelligible words, moves extremities spontaneously,  EXT: No edema or tenderness.  She has mittens on bilateral hands GU: Foley catheter with amber urine    Pressure Injury 12/17/22 Foot Left Stage 2 -  Partial thickness loss of dermis presenting as a shallow open injury with a red, pink wound bed without slough. (Active)  12/17/22   Location: Foot  Location Orientation: Left  Staging: Stage 2 -  Partial thickness loss of dermis presenting as a shallow open injury with a red, pink wound bed without slough.  Wound Description (Comments):   Present on Admission:   Dressing Type Gauze (Comment) 12/23/22 0555     Data Reviewed:   I have personally reviewed following labs and imaging studies:  Labs: Labs show the following:   Basic Metabolic Panel: Recent Labs  Lab 12/19/22 1347 12/20/22 0206 12/21/22 0515 12/22/22 0459 12/23/22 0114  NA 143 144 144 149* 146*  K 4.6 4.2 4.0 4.3 4.5  CL 112* 111 113* 115* 113*  CO2 '25 26 28 30 27  '$ GLUCOSE 115* 124* 73 90 123*  BUN 42* 45* 43* 40* 38*  CREATININE 2.35* 2.46* 1.88* 1.47* 1.32*  CALCIUM 10.3 9.9 9.8 9.7 9.8  MG 2.3 2.5* 2.5* 2.7* 2.5*  PHOS 3.7 3.8 3.7 3.6 3.5   GFR Estimated Creatinine Clearance: 54.3 mL/min (A) (by C-G formula based on SCr of 1.32 mg/dL (H)). Liver Function Tests: Recent Labs  Lab 12/19/22 0409 12/20/22 0206 12/21/22 0515 12/22/22 0459 12/23/22 0114  AST 172* 82* 52* 43* 37  ALT 333* 239* 165* 135* 113*  ALKPHOS 70 61 62 65 65  BILITOT 1.1 1.3* 1.4* 1.5* 1.4*  PROT 5.3* 5.5* 5.5* 5.5* 5.5*  ALBUMIN 2.9* 2.8* 2.7* 2.8* 2.7*   Recent Labs  Lab 12/17/22 1558 12/18/22 0122  LIPASE 380* 533*   No results for input(s): "AMMONIA" in the last 168 hours. Coagulation profile Recent Labs  Lab 12/17/22 1401 12/22/22 0608  INR 1.1 1.3*    CBC: Recent Labs  Lab 12/17/22 1401 12/18/22 0122 12/19/22 0409 12/20/22 1143 12/21/22 0515 12/22/22 0459 12/22/22 1450  12/22/22 1926 12/23/22 0114  WBC 3.2*   < > 8.2 7.2 5.7 4.5  --   --  5.1  NEUTROABS 2.1  --   --  5.6 3.9 2.7  --   --  3.9  HGB 14.8   < > 11.7* 10.1* 10.0* 9.8* 9.9* 10.3* 9.4*  HCT 46.4*   < > 37.2 31.3* 32.2* 31.5* 31.5* 33.4* 30.8*  MCV 88.2   < > 88.4 87.9 89.7 92.1  --   --  91.1  PLT 92*   < > 78* 56* 56* 55*  --   --  56*   < > = values in this interval not displayed.   Cardiac Enzymes: Recent Labs  Lab 12/17/22 1401  CKTOTAL 69   BNP (last 3 results) No results for input(s): "PROBNP" in the last 8760 hours. CBG: Recent Labs  Lab 12/22/22 1543 12/22/22 1913 12/22/22 2315 12/23/22 0324 12/23/22 0744  GLUCAP 99 102* 115* 112* 88   D-Dimer: No results for input(s): "DDIMER" in the last 72 hours.  Hgb A1c: No results for input(s): "HGBA1C" in the last 72 hours. Lipid Profile: No results for input(s): "CHOL", "HDL", "LDLCALC", "TRIG", "CHOLHDL", "LDLDIRECT" in the last 72 hours. Thyroid function studies: No results for input(s): "TSH", "T4TOTAL", "T3FREE", "THYROIDAB" in the last 72 hours.  Invalid input(s): "FREET3" Anemia work up: No results for input(s): "VITAMINB12", "FOLATE", "FERRITIN", "TIBC", "IRON", "RETICCTPCT" in the last 72 hours. Sepsis Labs: Recent Labs  Lab 12/17/22 1558 12/18/22 0122 12/18/22 0442 12/18/22 0836 12/19/22 0409 12/20/22 1143 12/21/22 0515 12/22/22 0459 12/23/22 0114  PROCALCITON <0.10 <0.10  --   --  0.11  --   --   --   --   WBC  --  8.0  --   --  8.2 7.2 5.7 4.5 5.1  LATICACIDVEN  --  3.1* 2.4* 1.5 1.1  --   --   --   --     Microbiology Recent Results (from the past 240 hour(s))  Blood Culture (routine x 2)     Status: None   Collection Time: 12/17/22  2:01 PM   Specimen: BLOOD  Result Value Ref Range Status   Specimen Description BLOOD RIGHT ANTECUBITAL  Final   Special Requests   Final    BOTTLES DRAWN AEROBIC AND ANAEROBIC Blood Culture adequate volume   Culture   Final    NO GROWTH 5 DAYS Performed at  [facility], [address] [place], [place] [address]    Report Status 12/22/2022 FINAL  Final  Blood Culture (routine x 2)     Status: None   Collection Time: 12/17/22  2:01 PM   Specimen: BLOOD  Result Value Ref Range Status   Specimen Description BLOOD LEFT ANTECUBITAL  Final   Special Requests   Final    BOTTLES DRAWN AEROBIC AND ANAEROBIC Blood Culture results may not be optimal due to an inadequate volume of blood received in culture bottles   Culture   Final    NO GROWTH 5 DAYS Performed at [facility], [address] [place], [place] [address]    Report Status 12/22/2022 FINAL  Final  Resp Panel by RT-PCR (Flu A&B, Covid) Anterior Nasal Swab     Status: None   Collection Time: 12/17/22  2:06 PM   Specimen: Anterior Nasal Swab  Result Value Ref Range Status   SARS Coronavirus 2 by RT PCR NEGATIVE NEGATIVE Final    Comment: (NOTE) SARS-CoV-2 target nucleic acids are NOT DETECTED.  The SARS-CoV-2 RNA is generally detectable in upper respiratory specimens during the acute phase of infection. The lowest concentration of SARS-CoV-2 viral copies this assay can detect is 138 copies/mL. A negative result does not preclude SARS-Cov-2 infection and should not be used as the sole basis for treatment or other patient management decisions. A negative result may occur with  improper specimen collection/handling, submission of  specimen other than nasopharyngeal swab, presence of viral mutation(s) within the areas targeted by this assay, and inadequate number of viral copies(<138 copies/mL). A negative result must be combined with clinical observations, patient history, and epidemiological information. The expected result is Negative.  Fact Sheet for Patients:  EntrepreneurPulse.com.au  Fact Sheet for Healthcare Providers:  IncredibleEmployment.be  This test is no t yet approved or cleared by the [place] FDA and  has  been authorized for detection and/or diagnosis of SARS-CoV-2 by FDA under an Emergency Use Authorization (EUA). This EUA will remain  in effect (meaning this test can be used) for the duration of the COVID-19 declaration under Section 564(b)(1) of the Act, 21 U.S.C.section 360bbb-3(b)(1), unless the authorization is terminated  or revoked sooner.       Influenza A by PCR NEGATIVE NEGATIVE Final   Influenza B by PCR NEGATIVE NEGATIVE Final    Comment: (NOTE) The Xpert Xpress SARS-CoV-2/FLU/RSV plus assay is intended as an aid in the diagnosis of influenza from Nasopharyngeal swab specimens and should not be used as a sole basis for treatment. Nasal washings and aspirates are unacceptable for Xpert Xpress SARS-CoV-2/FLU/RSV testing.  Fact Sheet for Patients: EntrepreneurPulse.com.au  Fact Sheet for Healthcare Providers: IncredibleEmployment.be  This test is not yet approved or cleared by the [place] FDA and has been authorized for detection and/or diagnosis of SARS-CoV-2 by FDA under an Emergency Use Authorization (EUA). This EUA will remain in effect (meaning this test can be used) for the duration of the COVID-19 declaration under Section 564(b)(1) of the Act, 21 U.S.C. section 360bbb-3(b)(1), unless the authorization is terminated or revoked.  Performed at [facility], [address] [place], [place] [address]   Urine Culture     Status: None   Collection Time: 12/17/22  3:58 PM   Specimen: In/Out Cath Urine  Result Value Ref Range Status   Specimen Description   Final    IN/OUT CATH URINE Performed at [facility], [address] [place], [place] [address]    Special Requests   Final    NONE Performed at [facility], [address] [place], [place] [address]    Culture   Final    NO GROWTH Performed at [facility], [place] [address] [place], [place] [address]    Report Status  12/18/2022 FINAL  Final  MRSA Next Gen by PCR, Nasal     Status: None   Collection Time: 12/17/22  9:02 PM   Specimen: Nasal Mucosa; Nasal Swab  Result Value Ref Range Status   MRSA by PCR Next Gen NOT DETECTED NOT DETECTED Final    Comment: (NOTE) The GeneXpert MRSA Assay (FDA approved for NASAL specimens only), is one component of a comprehensive MRSA colonization surveillance program. It is not intended to diagnose MRSA infection nor to guide or monitor treatment for MRSA infections. Test performance is not FDA approved in patients less than 71 years old. Performed at [facility], [address] [place], [place] [address]   Culture, Respiratory w Gram Stain     Status: None   Collection Time: 12/18/22 11:37 AM   Specimen: Tracheal Aspirate; Respiratory  Result Value Ref Range Status   Specimen Description   Final    TRACHEAL ASPIRATE Performed at [facility], [address] [place], [place] [address]    Special Requests   Final    NONE Performed at [facility], [address] [place], [place] [address]  Gram Stain   Final    RARE WBC PRESENT, PREDOMINANTLY PMN RARE BUDDING YEAST SEEN    Culture   Final    FEW Normal respiratory flora-no Staph aureus or Pseudomonas seen Performed at [facility] [address] [place], [place] [address]    Report Status 12/20/2022 FINAL  Final    Procedures and diagnostic studies:  No results found.             LOS: 6 days   Margie Urbanowicz  [facility]   Pager on [email or web address]. If 7PM-7AM, please contact night-coverage at [email or web address]     12/23/2022, 11:33 AM

## 2022-12-24 ENCOUNTER — Inpatient Hospital Stay: Payer: Medicare HMO

## 2022-12-24 DIAGNOSIS — E870 Hyperosmolality and hypernatremia: Secondary | ICD-10-CM | POA: Diagnosis not present

## 2022-12-24 DIAGNOSIS — K8511 Biliary acute pancreatitis with uninfected necrosis: Secondary | ICD-10-CM | POA: Diagnosis not present

## 2022-12-24 DIAGNOSIS — N179 Acute kidney failure, unspecified: Secondary | ICD-10-CM | POA: Diagnosis not present

## 2022-12-24 DIAGNOSIS — R040 Epistaxis: Secondary | ICD-10-CM | POA: Diagnosis not present

## 2022-12-24 LAB — CBC WITH DIFFERENTIAL/PLATELET
Abs Immature Granulocytes: 0.03 10*3/uL (ref 0.00–0.07)
Basophils Absolute: 0 10*3/uL (ref 0.0–0.1)
Basophils Relative: 0 %
Eosinophils Absolute: 0.1 10*3/uL (ref 0.0–0.5)
Eosinophils Relative: 1 %
HCT: 29.7 % — ABNORMAL LOW (ref 36.0–46.0)
Hemoglobin: 9.2 g/dL — ABNORMAL LOW (ref 12.0–15.0)
Immature Granulocytes: 1 %
Lymphocytes Relative: 19 %
Lymphs Abs: 1 10*3/uL (ref 0.7–4.0)
MCH: 28.3 pg (ref 26.0–34.0)
MCHC: 31 g/dL (ref 30.0–36.0)
MCV: 91.4 fL (ref 80.0–100.0)
Monocytes Absolute: 0.6 10*3/uL (ref 0.1–1.0)
Monocytes Relative: 12 %
Neutro Abs: 3.5 10*3/uL (ref 1.7–7.7)
Neutrophils Relative %: 67 %
Platelets: 64 10*3/uL — ABNORMAL LOW (ref 150–400)
RBC: 3.25 MIL/uL — ABNORMAL LOW (ref 3.87–5.11)
RDW: 13.9 % (ref 11.5–15.5)
WBC: 5.3 10*3/uL (ref 4.0–10.5)
nRBC: 0.4 % — ABNORMAL HIGH (ref 0.0–0.2)

## 2022-12-24 LAB — LACTIC ACID, PLASMA: Lactic Acid, Venous: 0.8 mmol/L (ref 0.5–1.9)

## 2022-12-24 LAB — COMPREHENSIVE METABOLIC PANEL
ALT: 94 U/L — ABNORMAL HIGH (ref 0–44)
AST: 35 U/L (ref 15–41)
Albumin: 2.7 g/dL — ABNORMAL LOW (ref 3.5–5.0)
Alkaline Phosphatase: 64 U/L (ref 38–126)
Anion gap: 3 — ABNORMAL LOW (ref 5–15)
BUN: 31 mg/dL — ABNORMAL HIGH (ref 8–23)
CO2: 29 mmol/L (ref 22–32)
Calcium: 9.8 mg/dL (ref 8.9–10.3)
Chloride: 115 mmol/L — ABNORMAL HIGH (ref 98–111)
Creatinine, Ser: 1.29 mg/dL — ABNORMAL HIGH (ref 0.44–1.00)
GFR, Estimated: 44 mL/min — ABNORMAL LOW (ref >60)
Glucose, Bld: 122 mg/dL — ABNORMAL HIGH (ref 70–99)
Potassium: 4 mmol/L (ref 3.5–5.1)
Sodium: 147 mmol/L — ABNORMAL HIGH (ref 135–145)
Total Bilirubin: 1.1 mg/dL (ref 0.3–1.2)
Total Protein: 5.5 g/dL — ABNORMAL LOW (ref 6.5–8.1)

## 2022-12-24 LAB — PROCALCITONIN: Procalcitonin: <0.1 ng/mL

## 2022-12-24 LAB — GLUCOSE, CAPILLARY
Glucose-Capillary: 109 mg/dL — ABNORMAL HIGH (ref 70–99)
Glucose-Capillary: 84 mg/dL (ref 70–99)
Glucose-Capillary: 107 mg/dL — ABNORMAL HIGH (ref 70–99)
Glucose-Capillary: 108 mg/dL — ABNORMAL HIGH (ref 70–99)
Glucose-Capillary: 93 mg/dL (ref 70–99)
Glucose-Capillary: 122 mg/dL — ABNORMAL HIGH (ref 70–99)

## 2022-12-24 LAB — PHOSPHORUS: Phosphorus: 2 mg/dL — ABNORMAL LOW (ref 2.5–4.6)

## 2022-12-24 LAB — MAGNESIUM: Magnesium: 2.3 mg/dL (ref 1.7–2.4)

## 2022-12-24 MED ORDER — DEXTROSE 5 % IV SOLN: INTRAVENOUS | Status: AC

## 2022-12-24 MED ORDER — K PHOS MONO-SOD PHOS DI & MONO 155-852-130 MG PO TABS
500.0000 mg | ORAL_TABLET | ORAL | Status: AC
  Administered 2022-12-24 (×2): 500 mg via ORAL
  Filled 2022-12-24 (×2): qty 2

## 2022-12-24 MED ORDER — HYDRALAZINE HCL 50 MG PO TABS
25.0000 mg | ORAL_TABLET | Freq: Two times a day (BID) | ORAL | Status: DC
  Administered 2022-12-24 – 2022-12-25 (×3): 25 mg via ORAL
  Filled 2022-12-24 (×3): qty 1

## 2022-12-24 MED ORDER — PIPERACILLIN-TAZOBACTAM 3.375 G IVPB
3.3750 g | Freq: Three times a day (TID) | INTRAVENOUS | Status: DC
  Administered 2022-12-24 – 2022-12-26 (×6): 3.375 g via INTRAVENOUS
  Filled 2022-12-24 (×6): qty 50

## 2022-12-24 NOTE — Progress Notes (Signed)
[facility] cardiology reconsulted on patient due to concerns for atrial fibrillation.  Telemetry reviewed, Rhythm shows sinus rhythm with PACs, PVCs no evidence for atrial fibrillation.  Communicated with attending hospitalist.  No additional cardiac input at this point.  Please let us know if additional cardiac input is needed  Signed, Kate Sable, M.D. 12/24/22 [place], [place]

## 2022-12-24 NOTE — Consult Note (Addendum)
Pharmacy Antibiotic Note  Jill Smith is a 71 y.o. female admitted on 12/17/2022 with pneumonia.  Pharmacy has been consulted for pip/tazo dosing. Fever, recent epistaxis, suspected aspiration   Plan: Zosyn 3.375g IV q8h (4 hour infusion).  Height: '5\' 9"'$  (175.3 cm) Weight: 122.4 kg (269 lb 13.5 oz) IBW/kg (Calculated) : 66.2  Temp (24hrs), Avg:98.9 F (37.2 C), Min:97.5 F (36.4 C), Max:100.6 F (38.1 C)  Recent Labs  Lab 12/18/22 0122 12/18/22 0442 12/18/22 0836 12/18/22 1146 12/19/22 0409 12/19/22 1347 12/20/22 0206 12/20/22 1143 12/21/22 0515 12/22/22 0459 12/23/22 0114 12/24/22 0402  WBC 8.0  --   --   --  8.2  --   --  7.2 5.7 4.5 5.1 5.3  CREATININE 1.16*  --   --    < > 2.18*   < > 2.46*  --  1.88* 1.47* 1.32* 1.29*  LATICACIDVEN 3.1* 2.4* 1.5  --  1.1  --   --   --   --   --   --   --    < > = values in this interval not displayed.    Estimated Creatinine Clearance: 56 mL/min (A) (by C-G formula based on SCr of 1.29 mg/dL (H)).    No Known Allergies  Antimicrobials this admission: 12/23 pip/tazo >>    Dose adjustments this admission: None  Microbiology results: 12/18 BCx: NGTD 12/19 tach Cx: RARE BUDDING YEAST SEEN   12/18 MRSA PCR: negative  Thank you for allowing pharmacy to be a part of this patient's care.  Oswald Hillock, PharmD, BCPS 12/24/2022 4:31 PM

## 2022-12-24 NOTE — Progress Notes (Addendum)
Progress Note    Jill Smith  MWU:[id number] DOB: 05-08-1951  DOA: 12/17/2022 PCP: [facility]      Brief Narrative:    Medical records reviewed and are as summarized below:  Jill Smith is a 71 y.o. female with medical history significant for bipolar disorder, schizophrenia, depression, hypertension, obesity, tubular adenoma of colon, who presented to the emergency department on 12/17/2022 with hypothermia and altered mental status.  According to EMS, she was non-verbal and that is not her baseline.  Reportedly, the heater in her home had stopped working on the night of 12/16/2022, and she was found the following morning with altered mental status.  ED course In the emergency room, rectal temperature was 31.8 F. BP 154/87/hr 36/ O2 sats 87% on RA.  Bear hugger applied and pt received 1L of warm saline due to severe hypothermia.  Lab results were: K+ 5.2/calcium 10.4/AST 73/ALT 129/total protein 6.3/wbc 3.2/platelet count 92.  COVID-19/Influenza A&B by PCR negative. EKG with sinus bradycardia. CT head with no acute abnormality (chronic findings noted). CT abdomen/pelvis showed findings of acute pancreatitis with early peripancreatic necrosis. There was also cholelithiasis (8 mm CBD) and a large gallstone in the neck of the gallbladder as well. Small volume ascites and bilateral pleural effusions were noted. There was also adrenal hyperplasia.   She was admitted to the hospital for acute pancreatitis with peripancreatic necrosis.  General surgeon and gastroenterologist were consulted, and they recommended conservative management.  She was treated with empiric IV antibiotics and IV fluids.  She developed asystole cardiac arrest on 12/18/2022,  around 2 AM in the morning.  She was intubated and placed on mechanical ventilation.  In the ICU, she required low-dose vasopressors.   Significant Hospital Events: Including procedures, antibiotic start and stop dates  in addition to other pertinent events   12/18: Pt admitted to ICU with acute metabolic encephalopathy, severe hypothermia (reported heater was nonfunctional at home) and sepsis secondary to pneumonia and acute pancreatitis  12/18: Overnight during rewarming pt became bradycardic and subsequently asystole requiring initiation of ACLS protocol and mechanical intubation prior to [place] 12/19: Pt had another episode of bradycardia followed by transient hypotension requiring continuous levophed gtt. Rewarmed. 12/20: increased intraabdominal pressures (measured at 40 mmHg). Continue rewarming. 12/21: intraabdominal pressure at 19 mmHg this morning. Awake and follows commands - SBT  She was transferred to [facility] hospitalist service on 12/21/2022.   Assessment/Plan:   Principal Problem:   Hypothermia Active Problems:   Acute pancreatitis   Cardiac arrest (HCC)   Calculus of gallbladder with acute cholecystitis without obstruction   Sepsis ([place])   Aspiration pneumonia (HCC)   Pressure injury of skin   SIRS (systemic inflammatory response syndrome) (HCC)   AKI (acute kidney injury) (HCC)   AV block, Mobitz 2   Non-traumatic compartment syndrome of abdomen   NSVT (nonsustained ventricular tachycardia) (HCC)   Hypernatremia   Epistaxis   Nutrition Problem: Inadequate oral intake   Signs/Symptoms: NPO status   Body mass index is 39.85 kg/m.  (Morbid obesity)   Acute pancreatitis with early peripancreatic necrosis, suspected aspiration pneumonia:  Continue dysphagia 1 diet as tolerated   Fever: Chest x-ray ordered today showed increasing density in the right lower lung field possibly underlying atelectasis/pneumonia.  However, given fever, tachycardia, advanced age and recent epistaxis with possible aspiration, patient will be started on empiric IV Zosyn again.  Blood culture, procalcitonin and lactic acid levels have been ordered. Of note, previous  chest x-ray on 12/20/2022 did not show  any active disease.   S/p cardiac arrest (asystole) on 12/19, NSVT Mobitz type II second-degree AV block, chronic diastolic CHF: Reengaged cardiologist because of concern for A-fib with RVR on telemetry and recent heart block.  This was reviewed by Dr. Garen Lah, cardiologist.  No A-fib. 2D echo showed EF estimated at 60 to 00%, grade 3 diastolic dysfunction (restrictive), mild MR, mild to moderate TR   Acute hypoxic respiratory failure: Resolved.  She has been weaned off of oxygen.  S/p extubation on 12/20/2022.     Recurrent epistaxis: S/p nose packing on 12/22/2022.  She was evaluated by Dr. Richardson Landry, ENT physician.  Plan to remove nose packing on 12/27/2022.  Completed treatment with Afrin nasal spray on 12/23/2022   Hypertension: Continue antihypertensives.   Use IV hydralazine as needed for severe hypertension.   S/p distributive shock: She is off of vasopressors and IV hydrocortisone.     AKI: Improving.    Hypernatremia, poor oral intake, s/p hypoglycemia on 12/20/2022: Sodium level is trending upward again.  Restart 5% dextrose infusion   Hypophosphatemia: Replete phosphorus with K-Phos.   Acute toxic metabolic encephalopathy: Slowly improving.  Continue supportive care.  EEG showed moderate to severe diffuse slowing indicating global cerebral dysfunction.  No epileptiform abnormalities were seen.     Diet Order             DIET - DYS 1 Room service appropriate? Yes with Assist; Fluid consistency: Nectar Thick  Diet effective now                            Consultants: [place] surgery Gastroenterologist  Procedures: Intubation and mechanical ventilation on 12/18/2022 Right IJ central line on 12/17/2022    Medications:    amLODipine  10 mg Oral Daily   Chlorhexidine Gluconate Cloth  6 each Topical Q0600   heparin injection (subcutaneous)  5,000 Units Subcutaneous Q12H   hydrALAZINE  25 mg Oral BID   isosorbide mononitrate  60  mg Oral Daily   nystatin   Topical TID   mouth rinse  15 mL Mouth Rinse 4 times per day   oxymetazoline  1 spray Each Nare BID   pantoprazole (PROTONIX) IV  40 mg Intravenous Daily   phosphorus  500 mg Oral Q4H   Continuous Infusions:  sodium chloride 10 mL/hr at 12/24/22 0800   dextrose 50 mL/hr at 12/24/22 0835     Anti-infectives (From admission, onward)    Start     Dose/Rate Route Frequency Ordered Stop   12/23/22 2200  piperacillin-tazobactam (ZOSYN) IVPB 3.375 g  Status:  Discontinued        3.375 g 12.5 mL/hr over 240 Minutes Intravenous Every 8 hours 12/23/22 0646 12/23/22 1132   12/22/22 2200  piperacillin-tazobactam (ZOSYN) IVPB 4.5 g  Status:  Discontinued        4.5 g 200 mL/hr over 30 Minutes Intravenous Every 8 hours 12/22/22 2105 12/23/22 1132   12/17/22 1600  piperacillin-tazobactam (ZOSYN) IVPB 3.375 g  Status:  Discontinued        3.375 g 12.5 mL/hr over 240 Minutes Intravenous Every 8 hours 12/17/22 1554 12/20/22 1019              Family Communication/Anticipated D/C date and plan/Code Status   DVT prophylaxis: heparin injection 5,000 Units Start: 12/18/22 2200 SCDs Start: 12/17/22 1520     Code Status: Full Code  Family Communication:  None Disposition Plan: Plan to discharge to SNF   Status is: Inpatient Remains inpatient appropriate because: Encephalopathy       Subjective:   Interval events noted.  She is unable to provide any history.  Patient has a fever with Tmax of 100.6 F.  Objective:    Vitals:   12/24/22 1000 12/24/22 1100 12/24/22 1200 12/24/22 1300  BP: (!) 149/54 (!) 124/49 122/63 134/64  Pulse: (!) 51 (!) 49 (!) 105 (!) 59  Resp: '14 13 15 12  '$ Temp: 99.9 F (37.7 C) 100 F (37.8 C) 100.2 F (37.9 C) (!) 100.4 F (38 C)  TempSrc:      SpO2: 97% 95% 97% 94%  Weight:      Height:       No data found.   Intake/Output Summary (Last 24 hours) at 12/24/2022 1409 Last data filed at 12/24/2022 0800 Gross per  24 hour  Intake 180 ml  Output 1500 ml  Net -1320 ml   Filed Weights   12/22/22 0452 12/23/22 0500 12/24/22 0406  Weight: 117.1 kg 120.8 kg 122.4 kg    Exam:   GEN: NAD SKIN: No rash EYES: EOMI ENT: MMM CV: RRR PULM: CTA B ABD: soft, obese, NT, +BS CNS: Drowsy but arousable, non focal EXT: No edema or tenderness GU: Foley catheter with amber urine    Pressure Injury 12/17/22 Foot Left Stage 2 -  Partial thickness loss of dermis presenting as a shallow open injury with a red, pink wound bed without slough. (Active)  12/17/22   Location: Foot  Location Orientation: Left  Staging: Stage 2 -  Partial thickness loss of dermis presenting as a shallow open injury with a red, pink wound bed without slough.  Wound Description (Comments):   Present on Admission:   Dressing Type Gauze (Comment) 12/24/22 0800     Data Reviewed:   I have personally reviewed following labs and imaging studies:  Labs: Labs show the following:   Basic Metabolic Panel: Recent Labs  Lab 12/20/22 0206 12/21/22 0515 12/22/22 0459 12/23/22 0114 12/24/22 0402  NA 144 144 149* 146* 147*  K 4.2 4.0 4.3 4.5 4.0  CL 111 113* 115* 113* 115*  CO2 '26 28 30 27 29  '$ GLUCOSE 124* 73 90 123* 122*  BUN 45* 43* 40* 38* 31*  CREATININE 2.46* 1.88* 1.47* 1.32* 1.29*  CALCIUM 9.9 9.8 9.7 9.8 9.8  MG 2.5* 2.5* 2.7* 2.5* 2.3  PHOS 3.8 3.7 3.6 3.5 2.0*   GFR Estimated Creatinine Clearance: 56 mL/min (A) (by C-G formula based on SCr of 1.29 mg/dL (H)). Liver Function Tests: Recent Labs  Lab 12/20/22 0206 12/21/22 0515 12/22/22 0459 12/23/22 0114 12/24/22 0402  AST 82* 52* 43* 37 35  ALT 239* 165* 135* 113* 94*  ALKPHOS 61 62 65 65 64  BILITOT 1.3* 1.4* 1.5* 1.4* 1.1  PROT 5.5* 5.5* 5.5* 5.5* 5.5*  ALBUMIN 2.8* 2.7* 2.8* 2.7* 2.7*   Recent Labs  Lab 12/17/22 1558 12/18/22 0122  LIPASE 380* 533*   No results for input(s): "AMMONIA" in the last 168 hours. Coagulation profile Recent Labs   Lab 12/22/22 0608  INR 1.3*    CBC: Recent Labs  Lab 12/20/22 1143 12/21/22 0515 12/22/22 0459 12/22/22 1450 12/22/22 1926 12/23/22 0114 12/24/22 0402  WBC 7.2 5.7 4.5  --   --  5.1 5.3  NEUTROABS 5.6 3.9 2.7  --   --  3.9 3.5  HGB 10.1* 10.0* 9.8* 9.9* 10.3* 9.4* 9.2*  HCT 31.3* 32.2* 31.5* 31.5* 33.4* 30.8* 29.7*  MCV 87.9 89.7 92.1  --   --  91.1 91.4  PLT 56* 56* 55*  --   --  56* 64*   Cardiac Enzymes: No results for input(s): "CKTOTAL", "CKMB", "CKMBINDEX", "TROPONINI" in the last 168 hours.  BNP (last 3 results) No results for input(s): "PROBNP" in the last 8760 hours. CBG: Recent Labs  Lab 12/23/22 1917 12/23/22 2334 12/24/22 0345 12/24/22 0712 12/24/22 1208  GLUCAP 132* 89 109* 84 107*   D-Dimer: No results for input(s): "DDIMER" in the last 72 hours.  Hgb A1c: No results for input(s): "HGBA1C" in the last 72 hours. Lipid Profile: No results for input(s): "CHOL", "HDL", "LDLCALC", "TRIG", "CHOLHDL", "LDLDIRECT" in the last 72 hours. Thyroid function studies: No results for input(s): "TSH", "T4TOTAL", "T3FREE", "THYROIDAB" in the last 72 hours.  Invalid input(s): "FREET3" Anemia work up: No results for input(s): "VITAMINB12", "FOLATE", "FERRITIN", "TIBC", "IRON", "RETICCTPCT" in the last 72 hours. Sepsis Labs: Recent Labs  Lab 12/17/22 1557 12/17/22 1558 12/18/22 0122 12/18/22 0442 12/18/22 0836 12/19/22 0409 12/20/22 1143 12/21/22 0515 12/22/22 0459 12/23/22 0114 12/24/22 0402  PROCALCITON  --  <0.10 <0.10  --   --  0.11  --   --   --   --   --   WBC   < >  --  8.0  --   --  8.2   < > 5.7 4.5 5.1 5.3  LATICACIDVEN  --   --  3.1* 2.4* 1.5 1.1  --   --   --   --   --    < > = values in this interval not displayed.    Microbiology Recent Results (from the past 240 hour(s))  Blood Culture (routine x 2)     Status: None   Collection Time: 12/17/22  2:01 PM   Specimen: BLOOD  Result Value Ref Range Status   Specimen Description BLOOD  RIGHT ANTECUBITAL  Final   Special Requests   Final    BOTTLES DRAWN AEROBIC AND ANAEROBIC Blood Culture adequate volume   Culture   Final    NO GROWTH 5 DAYS Performed at [facility], [address] [place], [place] [address]    Report Status 12/22/2022 FINAL  Final  Blood Culture (routine x 2)     Status: None   Collection Time: 12/17/22  2:01 PM   Specimen: BLOOD  Result Value Ref Range Status   Specimen Description BLOOD LEFT ANTECUBITAL  Final   Special Requests   Final    BOTTLES DRAWN AEROBIC AND ANAEROBIC Blood Culture results may not be optimal due to an inadequate volume of blood received in culture bottles   Culture   Final    NO GROWTH 5 DAYS Performed at [facility], [address] [place], [place] [address]    Report Status 12/22/2022 FINAL  Final  Resp Panel by RT-PCR (Flu A&B, Covid) Anterior Nasal Swab     Status: None   Collection Time: 12/17/22  2:06 PM   Specimen: Anterior Nasal Swab  Result Value Ref Range Status   SARS Coronavirus 2 by RT PCR NEGATIVE NEGATIVE Final    Comment: (NOTE) SARS-CoV-2 target nucleic acids are NOT DETECTED.  The SARS-CoV-2 RNA is generally detectable in upper respiratory specimens during the acute phase of infection. The lowest concentration of SARS-CoV-2 viral copies this assay can detect is 138 copies/mL. A negative result does not preclude SARS-Cov-2 infection and should  not be used as the sole basis for treatment or other patient management decisions. A negative result may occur with  improper specimen collection/handling, submission of specimen other than nasopharyngeal swab, presence of viral mutation(s) within the areas targeted by this assay, and inadequate number of viral copies(<138 copies/mL). A negative result must be combined with clinical observations, patient history, and epidemiological information. The expected result is Negative.  Fact Sheet for Patients:   EntrepreneurPulse.com.au  Fact Sheet for Healthcare Providers:  IncredibleEmployment.be  This test is no t yet approved or cleared by the [place] FDA and  has been authorized for detection and/or diagnosis of SARS-CoV-2 by FDA under an Emergency Use Authorization (EUA). This EUA will remain  in effect (meaning this test can be used) for the duration of the COVID-19 declaration under Section 564(b)(1) of the Act, 21 U.S.C.section 360bbb-3(b)(1), unless the authorization is terminated  or revoked sooner.       Influenza A by PCR NEGATIVE NEGATIVE Final   Influenza B by PCR NEGATIVE NEGATIVE Final    Comment: (NOTE) The Xpert Xpress SARS-CoV-2/FLU/RSV plus assay is intended as an aid in the diagnosis of influenza from Nasopharyngeal swab specimens and should not be used as a sole basis for treatment. Nasal washings and aspirates are unacceptable for Xpert Xpress SARS-CoV-2/FLU/RSV testing.  Fact Sheet for Patients: EntrepreneurPulse.com.au  Fact Sheet for Healthcare Providers: IncredibleEmployment.be  This test is not yet approved or cleared by the [place] FDA and has been authorized for detection and/or diagnosis of SARS-CoV-2 by FDA under an Emergency Use Authorization (EUA). This EUA will remain in effect (meaning this test can be used) for the duration of the COVID-19 declaration under Section 564(b)(1) of the Act, 21 U.S.C. section 360bbb-3(b)(1), unless the authorization is terminated or revoked.  Performed at [facility], [address] [place], [place] [address]   Urine Culture     Status: None   Collection Time: 12/17/22  3:58 PM   Specimen: In/Out Cath Urine  Result Value Ref Range Status   Specimen Description   Final    IN/OUT CATH URINE Performed at [facility], [address], [place], [place] [address]    Special Requests   Final    NONE Performed  at [facility], [address] [place], [place] [address]    Culture   Final    NO GROWTH Performed at [facility], [place] [address] [place], [place] [address]    Report Status 12/18/2022 FINAL  Final  MRSA Next Gen by PCR, Nasal     Status: None   Collection Time: 12/17/22  9:02 PM   Specimen: Nasal Mucosa; Nasal Swab  Result Value Ref Range Status   MRSA by PCR Next Gen NOT DETECTED NOT DETECTED Final    Comment: (NOTE) The GeneXpert MRSA Assay (FDA approved for NASAL specimens only), is one component of a comprehensive MRSA colonization surveillance program. It is not intended to diagnose MRSA infection nor to guide or monitor treatment for MRSA infections. Test performance is not FDA approved in patients less than 39 years old. Performed at [facility], [address] [place],  [address]   Culture, Respiratory w Gram Stain     Status: None   Collection Time: 12/18/22 11:37 AM   Specimen: Tracheal Aspirate; Respiratory  Result Value Ref Range Status   Specimen Description   Final    TRACHEAL ASPIRATE Performed at [facility], [place],  [place] [address]    Special Requests   Final    NONE Performed at [facility], [place], [place] [address]    Gram Stain   Final    RARE WBC PRESENT, PREDOMINANTLY PMN RARE BUDDING YEAST SEEN    Culture   Final    FEW Normal respiratory flora-no Staph aureus or Pseudomonas seen Performed at [facility] [address], [place], [place] [address]    Report Status 12/20/2022 FINAL  Final    Procedures and diagnostic studies:  No results found.             LOS: 7 days   Tyeson Tanimoto  [facility]   Pager on [email or web address]. If 7PM-7AM, please contact night-coverage at [email or web address]     12/24/2022, 2:09 PM

## 2022-12-25 DIAGNOSIS — K8511 Biliary acute pancreatitis with uninfected necrosis: Secondary | ICD-10-CM | POA: Diagnosis not present

## 2022-12-25 DIAGNOSIS — I469 Cardiac arrest, cause unspecified: Secondary | ICD-10-CM | POA: Diagnosis not present

## 2022-12-25 DIAGNOSIS — R040 Epistaxis: Secondary | ICD-10-CM

## 2022-12-25 DIAGNOSIS — J690 Pneumonitis due to inhalation of food and vomit: Secondary | ICD-10-CM | POA: Diagnosis not present

## 2022-12-25 DIAGNOSIS — Z7189 Other specified counseling: Secondary | ICD-10-CM | POA: Diagnosis not present

## 2022-12-25 DIAGNOSIS — Z515 Encounter for palliative care: Secondary | ICD-10-CM | POA: Diagnosis not present

## 2022-12-25 LAB — COMPREHENSIVE METABOLIC PANEL
ALT: 97 U/L — ABNORMAL HIGH (ref 0–44)
AST: 34 U/L (ref 15–41)
Albumin: 2.8 g/dL — ABNORMAL LOW (ref 3.5–5.0)
Alkaline Phosphatase: 68 U/L (ref 38–126)
Anion gap: 6 (ref 5–15)
BUN: 32 mg/dL — ABNORMAL HIGH (ref 8–23)
CO2: 27 mmol/L (ref 22–32)
Calcium: 10 mg/dL (ref 8.9–10.3)
Chloride: 112 mmol/L — ABNORMAL HIGH (ref 98–111)
Creatinine, Ser: 1.24 mg/dL — ABNORMAL HIGH (ref 0.44–1.00)
GFR, Estimated: 47 mL/min — ABNORMAL LOW (ref >60)
Glucose, Bld: 114 mg/dL — ABNORMAL HIGH (ref 70–99)
Potassium: 4 mmol/L (ref 3.5–5.1)
Sodium: 145 mmol/L (ref 135–145)
Total Bilirubin: 1.7 mg/dL — ABNORMAL HIGH (ref 0.3–1.2)
Total Protein: 6 g/dL — ABNORMAL LOW (ref 6.5–8.1)

## 2022-12-25 LAB — CBC WITH DIFFERENTIAL/PLATELET
Abs Immature Granulocytes: 0.03 10*3/uL (ref 0.00–0.07)
Basophils Absolute: 0 10*3/uL (ref 0.0–0.1)
Basophils Relative: 0 %
Eosinophils Absolute: 0 10*3/uL (ref 0.0–0.5)
Eosinophils Relative: 0 %
HCT: 31.3 % — ABNORMAL LOW (ref 36.0–46.0)
Hemoglobin: 9.6 g/dL — ABNORMAL LOW (ref 12.0–15.0)
Immature Granulocytes: 0 %
Lymphocytes Relative: 10 %
Lymphs Abs: 0.7 10*3/uL (ref 0.7–4.0)
MCH: 28.1 pg (ref 26.0–34.0)
MCHC: 30.7 g/dL (ref 30.0–36.0)
MCV: 91.5 fL (ref 80.0–100.0)
Monocytes Absolute: 1.2 10*3/uL — ABNORMAL HIGH (ref 0.1–1.0)
Monocytes Relative: 17 %
Neutro Abs: 5 10*3/uL (ref 1.7–7.7)
Neutrophils Relative %: 73 %
Platelets: 96 10*3/uL — ABNORMAL LOW (ref 150–400)
RBC: 3.42 MIL/uL — ABNORMAL LOW (ref 3.87–5.11)
RDW: 14.4 % (ref 11.5–15.5)
WBC: 7 10*3/uL (ref 4.0–10.5)
nRBC: 0 % (ref 0.0–0.2)

## 2022-12-25 LAB — LACTIC ACID, PLASMA: Lactic Acid, Venous: 1 mmol/L (ref 0.5–1.9)

## 2022-12-25 LAB — GLUCOSE, CAPILLARY
Glucose-Capillary: 117 mg/dL — ABNORMAL HIGH (ref 70–99)
Glucose-Capillary: 130 mg/dL — ABNORMAL HIGH (ref 70–99)
Glucose-Capillary: 196 mg/dL — ABNORMAL HIGH (ref 70–99)
Glucose-Capillary: 113 mg/dL — ABNORMAL HIGH (ref 70–99)
Glucose-Capillary: 134 mg/dL — ABNORMAL HIGH (ref 70–99)
Glucose-Capillary: 121 mg/dL — ABNORMAL HIGH (ref 70–99)

## 2022-12-25 LAB — PHOSPHORUS: Phosphorus: 3.5 mg/dL (ref 2.5–4.6)

## 2022-12-25 LAB — MAGNESIUM: Magnesium: 2.3 mg/dL (ref 1.7–2.4)

## 2022-12-25 LAB — PROCALCITONIN: Procalcitonin: 0.13 ng/mL

## 2022-12-25 MED ORDER — NEPRO/CARBSTEADY PO LIQD
237.0000 mL | Freq: Three times a day (TID) | ORAL | Status: DC
  Administered 2022-12-25 – 2022-12-29 (×8): 237 mL via ORAL

## 2022-12-25 MED ORDER — VITAMIN C 500 MG PO TABS
500.0000 mg | ORAL_TABLET | Freq: Two times a day (BID) | ORAL | Status: DC
  Administered 2022-12-25 – 2022-12-29 (×8): 500 mg via ORAL
  Filled 2022-12-25 (×8): qty 1

## 2022-12-25 MED ORDER — OXYMETAZOLINE HCL 0.05 % NA SOLN
8.0000 | Freq: Two times a day (BID) | NASAL | Status: AC
  Administered 2022-12-25 – 2022-12-27 (×4): 8 via NASAL
  Filled 2022-12-25 (×2): qty 15

## 2022-12-25 MED ORDER — HYDRALAZINE HCL 10 MG PO TABS
10.0000 mg | ORAL_TABLET | Freq: Two times a day (BID) | ORAL | Status: DC
  Administered 2022-12-25: 10 mg via ORAL
  Filled 2022-12-25 (×2): qty 1

## 2022-12-25 MED ORDER — ADULT MULTIVITAMIN W/MINERALS CH
1.0000 | ORAL_TABLET | Freq: Every day | ORAL | Status: DC
  Administered 2022-12-26 – 2022-12-29 (×4): 1 via ORAL
  Filled 2022-12-25 (×4): qty 1

## 2022-12-25 MED ORDER — OXYMETAZOLINE HCL 0.05 % NA SOLN
1.0000 | Freq: Two times a day (BID) | NASAL | Status: DC
  Filled 2022-12-25 (×2): qty 15

## 2022-12-25 NOTE — Progress Notes (Addendum)
Patient agitated, pulling at nasal packing. Gauze dislodged, removed rest of the way. No signs of bleeding. Will continue to monitor. MD made aware.

## 2022-12-25 NOTE — Progress Notes (Signed)
Nutrition Follow Up Note   DOCUMENTATION CODES:   Obesity unspecified  INTERVENTION:   Nepro Shake po TID, each supplement provides 425 kcal and 19 grams protein  Magic cup TID with meals, each supplement provides 290 kcal and 9 grams of protein  MVI po daily   Vitamin C '500mg'$  po BID  NUTRITION DIAGNOSIS:   Inadequate oral intake related to inability to eat as evidenced by NPO status.  GOAL:   Patient will meet greater than or equal to 90% of their needs -progressing   MONITOR:   PO intake, Supplement acceptance, Labs, Weight trends, Skin, I & O's  ASSESSMENT:   71 y/o female with h/o schizophrenia, depression, bipolar disorder, HTN, CKD and DJD who is admitted with  hypothermia, AMS, sepsis, acute pancreatitis with early peripancreatic necrosis, cholelithiasis and large gallstone in the neck of the gallbladder and, AKI and pneumonia complicated by cardiac arrest. Pt also noted to have new lung nodule.   Pt with recurrent epistaxis. Pt initiated on a dysphagia 1/nectar thick diet 12/23. Pt eating 100% of all of her meals today per RN report. RD will add supplements and vitamins to help pt meet her estimated needs and to support healing. Per chart, pt is weight stable since admission.   Medications reviewed and include: heparin, protonix, zosyn   Labs reviewed: K 4.0 wnl, BUN 32(H), creat 1.24(H), P 3.5 wnl, Mg 2.3 wnl  Hgb 9.6(L), Hct 31.3(L) Cbgs- 196, 130, 117 x 24 hrs  Diet Order:   Diet Order             DIET - DYS 1 Room service appropriate? Yes with Assist; Fluid consistency: Nectar Thick  Diet effective now                  EDUCATION NEEDS:   No education needs have been identified at this time  Skin:  Skin Assessment: Skin Integrity Issues: Skin Integrity Issues:: Stage II Stage II: lt foot  Last BM:  12/21/22 (type 6)  Height:   Ht Readings from Last 1 Encounters:  12/18/22 '5\' 9"'$  (1.753 m)    Weight:   Wt Readings from Last 1 Encounters:   12/24/22 122.4 kg    Ideal Body Weight:  65.9 kg  BMI:  Body mass index is 39.85 kg/m.  Estimated Nutritional Needs:   Kcal:  2300-2600kcal/day  Protein:  115-130g/day  Fluid:  1.7-2.0L/day  Koleen Distance MS, RD, LDN Please refer to [facility] for RD and/or RD on-call/weekend/after hours pager

## 2022-12-25 NOTE — Progress Notes (Signed)
Patient alert to self, in bed on room air, with soft blood pressures, MD aware. Patient having frequent PVCs and short runs of ventricular bigeminy. MD made aware. No further signs of bleeding. Patient dependent feeder, tolerating well. Report given to night shift, night shift will continue to monitor.

## 2022-12-25 NOTE — Progress Notes (Signed)
Palliative: Chart review completed.  Jill Smith has had a difficult hospital course.  She was found to be unresponsive in her home with temperature of 81 after heater in her home failed.  She was admitted with acute pancreatitis and had asystole cardiac arrest on 12/19.  She was intubated/ventilated in the ICU with low-dose vasopressors.  She was extubated 12/21 and has been weaned off oxygen.  Her respiratory status remained stable.  She does continue to have confusion which is slowly improving.  EEG showed moderate to severe slowing indicating global cerebral dysfunction.  Jill Smith has had a nosebleed with possible aspiration.  She has had fever, tachycardia.  She has been started on empiric IV Zosyn.  Her condition remains guarded.  It is anticipated that she would need short-term rehab.  Call to sister/healthcare surrogate, Jill Smith.  We talk about Jill Smith's nosebleed and possible aspiration.  Jill Smith shares that Jill Smith has nosebleeds at home too, they are unsure of the cause.  Jill Smith shares that she can "see a big improvement".  She shares that her sister wants to return to their home Jill Smith tells me that they have a hospital bed at home and had home health.  I ask if Jill Smith is unable to walk, could she still be cared for at home.  Jill Smith replies, "ummh, she can walk".  I share that we will give it time, see how she is able to do.  Conference with attending, bedside nursing staff, transition of care team related to patient condition, needs, goals of care, disposition.  Plan: At this point continue full scope/full code.  Time for outcomes.  Ultimate goal is to return home.  Anticipate need for short-term rehab.  35 minutes Jill Axe, NP Palliative medicine team Team phone [phone] Greater than 50% of this time was spent counseling and coordinating care related to the above assessment and plan.

## 2022-12-25 NOTE — Evaluation (Signed)
Occupational Therapy Evaluation Patient Details Name: Jill Smith MRN: [id number] DOB: [birth year] Today's Date: 12/25/2022   History of Present Illness 71 y/o female presented to ED on 12/17/22 for AMS and hypothermia. Code blue on 12/19. Intubated 12/18-12/21. Found to have toxic metabolic encephalopathy in setting of acute pancreatitis and hypothermia. PMH: bipolar disorder, HTN, schizophrenia, depression   Clinical Impression   Ms Hettinger was seen for OT/PT co-evaluation this date. Pt is poor historian, unclear PLOF. Pt presents to acute OT demonstrating impaired ADL performance and functional mobility 2/2 decreased activity tolerance and functional strength/ROM/balance deficits. Pt currently requires MIN A self-drinking at bed level, assist to maintain aspiration pcns. MAX A x2 bed mobility, noted to have soiled sheets. MAX A x2 sit<>stand for bed change, clears rear. MOD A x2 don/doff gown seated EOB, +2 for sitting balance. Pt would benefit from skilled OT to address noted impairments and functional limitations (see below for any additional details). Upon hospital discharge, recommend STR to maximize pt safety and return to PLOF.    Recommendations for follow up therapy are one component of a multi-disciplinary discharge planning process, led by the attending physician.  Recommendations may be updated based on patient status, additional functional criteria and insurance authorization.   Follow Up Recommendations  Skilled nursing-short term rehab (<3 hours/day)     Assistance Recommended at Discharge Frequent or constant Supervision/Assistance  Patient can return home with the following A lot of help with walking and/or transfers;A lot of help with bathing/dressing/bathroom;Help with stairs or ramp for entrance    Functional Status Assessment  Patient has had a recent decline in their functional status and demonstrates the ability to make significant improvements in function in a  reasonable and predictable amount of time.  Equipment Recommendations  Other (comment) (defer)    Recommendations for Other Services       Precautions / Restrictions Precautions Precautions: Fall Restrictions Weight Bearing Restrictions: No      Mobility Bed Mobility Overal bed mobility: Needs Assistance Bed Mobility: Supine to Sit, Sit to Supine     Supine to sit: Max assist, +2 for physical assistance, +2 for safety/equipment Sit to supine: +2 for physical assistance, Total assist   General bed mobility comments: assist for trunk elevation and LE management    Transfers Overall transfer level: Needs assistance Equipment used: 2 person hand held assist Transfers: Sit to/from Stand Sit to Stand: Max assist, +2 safety/equipment, +2 physical assistance           General transfer comment: clears rear, does not achieve full upright posture      Balance Overall balance assessment: Needs assistance Sitting-balance support: No upper extremity supported, Feet supported Sitting balance-Leahy Scale: Poor Sitting balance - Comments: initial MIN A decreasing to MAX A   Standing balance support: Bilateral upper extremity supported, Reliant on assistive device for balance Standing balance-Leahy Scale: Zero                             ADL either performed or assessed with clinical judgement   ADL Overall ADL's : Needs assistance/impaired                                       General ADL Comments: MIN A self-drinking at bed level, assist to maintain aspiration pcns. TOTAL A don B socks bed level. MAX A x2  standing for bed change, +2 toilet ting at bed level. MOD A x2 don/doff gown seated EOB, +2 for sitting balance      Pertinent Vitals/Pain Pain Assessment Pain Assessment: Faces Faces Pain Scale: No hurt Pain Intervention(s): Monitored during session     Hand Dominance     Extremity/Trunk Assessment Upper Extremity Assessment Upper  Extremity Assessment: Generalized weakness   Lower Extremity Assessment Lower Extremity Assessment: Generalized weakness   Cervical / Trunk Assessment Cervical / Trunk Assessment: Kyphotic   Communication Communication Communication: Expressive difficulties   Cognition Arousal/Alertness: Awake/alert Behavior During Therapy: Flat affect Overall Cognitive Status: No family/caregiver present to determine baseline cognitive functioning Area of Impairment: Orientation, Safety/judgement, Following commands, Awareness, Memory, Problem solving                 Orientation Level: Disoriented to, Time, Situation   Memory: Decreased recall of precautions, Decreased short-term memory Following Commands: Follows one step commands inconsistently, Follows one step commands with increased time Safety/Judgement: Decreased awareness of safety, Decreased awareness of deficits Awareness: Intellectual Problem Solving: Slow processing, Difficulty sequencing, Requires verbal cues, Requires tactile cues General Comments: some speech incoherent, step by step cueing      Home Living Family/patient expects to be discharged to:: Skilled nursing facility Living Arrangements: Alone                                      Prior Functioning/Environment Prior Level of Function : Patient poor historian/Family not available             Mobility Comments: unable to obtain PLOF due to patient being poor historian and garbled speech          OT Problem List: Decreased strength;Decreased range of motion;Decreased activity tolerance;Impaired balance (sitting and/or standing);Decreased safety awareness      OT Treatment/Interventions: Self-care/ADL training;Therapeutic exercise;Energy conservation;DME and/or AE instruction;Therapeutic activities;Patient/family education;Balance training    OT Goals(Current goals can be found in the care plan section) Acute Rehab OT Goals Patient Stated  Goal: to go home OT Goal Formulation: With patient Time For Goal Achievement: 01/08/23 Potential to Achieve Goals: Good ADL Goals Pt Will Perform Grooming: with min guard assist;sitting (tolerate >5 mins) Pt Will Perform Lower Body Dressing: with mod assist;sitting/lateral leans Pt Will Transfer to Toilet: with mod assist;squat pivot transfer;bedside commode  OT Frequency: Min 2X/week    Co-evaluation PT/OT/SLP Co-Evaluation/Treatment: Yes Reason for Co-Treatment: For patient/therapist safety;To address functional/ADL transfers PT goals addressed during session: Mobility/safety with mobility OT goals addressed during session: ADL's and self-care      AM-PAC OT "6 Clicks" Daily Activity     Outcome Measure Help from another person eating meals?: A Little Help from another person taking care of personal grooming?: A Lot Help from another person toileting, which includes using toliet, bedpan, or urinal?: A Lot Help from another person bathing (including washing, rinsing, drying)?: A Lot Help from another person to put on and taking off regular upper body clothing?: A Lot Help from another person to put on and taking off regular lower body clothing?: Total 6 Click Score: 12   End of Session Nurse Communication: Mobility status  Activity Tolerance: Patient tolerated treatment well Patient left: in bed;with call bell/phone within reach;with nursing/sitter in room  OT Visit Diagnosis: Muscle weakness (generalized) (M62.81);Unsteadiness on feet (R26.81)                Time:  8677-3736 OT Time Calculation (min): 24 min Charges:  OT General Charges $OT Visit: 1 Visit OT Evaluation $OT Eval Moderate Complexity: 1 Mod OT Treatments $Self Care/Home Management : 8-22 mins  Dessie Coma, M.S. OTR/L  12/25/22, 4:30 PM  ascom [phone]

## 2022-12-25 NOTE — Evaluation (Signed)
Physical Therapy Evaluation Patient Details Name: Jill Smith MRN: [id number] DOB: Jan 05, 1951 Today's Date: 12/25/2022  History of Present Illness  71 y/o female presented to ED on 12/17/22 for AMS and hypothermia. Code blue on 12/19. Intubated 12/18-12/21. Found to have toxic metabolic encephalopathy in setting of acute pancreatitis and hypothermia. PMH: bipolar disorder, HTN, schizophrenia, depression  Clinical Impression  Patient admitted with the above. Per chart review, patient lives alone but unable to determine PLOF due to cognition and garbled speech. Patient presents with weakness, impaired balance, decreased activity tolerance, and impaired cognition. Oriented to self and place. Required maxA+2 for bed mobility and partial stand at EOB. Difficulty maintaining sitting balance with fatigue requiring maxA to maintain upright. Patient will benefit from skilled PT services during acute stay to address listed deficits. Recommend SNF at discharge to maximize functional mobility and safety.      Recommendations for follow up therapy are one component of a multi-disciplinary discharge planning process, led by the attending physician.  Recommendations may be updated based on patient status, additional functional criteria and insurance authorization.  Follow Up Recommendations Skilled nursing-short term rehab (<3 hours/day) Can patient physically be transported by private vehicle: No    Assistance Recommended at Discharge Frequent or constant Supervision/Assistance  Patient can return home with the following  Two people to help with walking and/or transfers;Two people to help with bathing/dressing/bathroom;Assistance with cooking/housework;Assist for transportation;Help with stairs or ramp for entrance;Direct supervision/assist for medications management;Direct supervision/assist for financial management;Assistance with feeding    Equipment Recommendations Other (comment) (defer to next  venue of care)  Recommendations for Other Services       Functional Status Assessment Patient has had a recent decline in their functional status and demonstrates the ability to make significant improvements in function in a reasonable and predictable amount of time.     Precautions / Restrictions Precautions Precautions: Fall Restrictions Weight Bearing Restrictions: No      Mobility  Bed Mobility Overal bed mobility: Needs Assistance Bed Mobility: Supine to Sit, Sit to Supine     Supine to sit: Max assist, +2 for physical assistance, +2 for safety/equipment Sit to supine: Max assist, +2 for physical assistance, +2 for safety/equipment   General bed mobility comments: assist for trunk elevation and LE management    Transfers Overall transfer level: Needs assistance Equipment used: 2 person hand held assist Transfers: Sit to/from Stand Sit to Stand: Max assist, +2 safety/equipment, +2 physical assistance           General transfer comment: required maxA+2 to partially stand from elevated bed surface    Ambulation/Gait                  Stairs            Wheelchair Mobility    Modified Rankin (Stroke Patients Only)       Balance Overall balance assessment: Needs assistance Sitting-balance support: No upper extremity supported, Feet supported Sitting balance-Leahy Scale: Poor Sitting balance - Comments: initially able to maintain sitting balance with min guard but progressed to inability to maintain upright without maxA   Standing balance support: Bilateral upper extremity supported, Reliant on assistive device for balance Standing balance-Leahy Scale: Zero                               Pertinent Vitals/Pain Pain Assessment Pain Assessment: Faces Faces Pain Scale: No hurt Pain Intervention(s): Monitored during session  Home Living Family/patient expects to be discharged to:: Skilled nursing facility Living Arrangements:  Alone                      Prior Function Prior Level of Function : Patient poor historian/Family not available             Mobility Comments: unable to obtain PLOF due to patient being poor historian and garbled speech       Hand Dominance        Extremity/Trunk Assessment   Upper Extremity Assessment Upper Extremity Assessment: Defer to OT evaluation    Lower Extremity Assessment Lower Extremity Assessment: Generalized weakness;Difficult to assess due to impaired cognition    Cervical / Trunk Assessment Cervical / Trunk Assessment: Kyphotic  Communication   Communication: Expressive difficulties  Cognition Arousal/Alertness: Awake/alert Behavior During Therapy: Flat affect Overall Cognitive Status: No family/caregiver present to determine baseline cognitive functioning Area of Impairment: Orientation, Attention, Safety/judgement, Following commands, Awareness, Memory, Problem solving                 Orientation Level: Disoriented to, Time, Situation Current Attention Level: Sustained Memory: Decreased recall of precautions, Decreased short-term memory Following Commands: Follows one step commands inconsistently, Follows one step commands with increased time Safety/Judgement: Decreased awareness of safety, Decreased awareness of deficits Awareness: Intellectual Problem Solving: Slow processing, Difficulty sequencing, Requires verbal cues, Requires tactile cues General Comments: no family present to determine baseline cognition. A&Ox2        General Comments      Exercises     Assessment/Plan    PT Assessment Patient needs continued PT services  PT Problem List Decreased strength;Decreased activity tolerance;Decreased balance;Decreased mobility;Decreased coordination;Decreased cognition;Decreased knowledge of use of DME;Decreased knowledge of precautions;Decreased safety awareness       PT Treatment Interventions DME instruction;Gait  training;Functional mobility training;Therapeutic activities;Therapeutic exercise;Balance training;Patient/family education    PT Goals (Current goals can be found in the Care Plan section)  Acute Rehab PT Goals Patient Stated Goal: did not state PT Goal Formulation: Patient unable to participate in goal setting Time For Goal Achievement: 01/08/23 Potential to Achieve Goals: Fair    Frequency Min 2X/week     Co-evaluation PT/OT/SLP Co-Evaluation/Treatment: Yes Reason for Co-Treatment: For patient/therapist safety;To address functional/ADL transfers;Necessary to address cognition/behavior during functional activity PT goals addressed during session: Mobility/safety with mobility;Balance         AM-PAC PT "6 Clicks" Mobility  Outcome Measure Help needed turning from your back to your side while in a flat bed without using bedrails?: Total Help needed moving from lying on your back to sitting on the side of a flat bed without using bedrails?: Total Help needed moving to and from a bed to a chair (including a wheelchair)?: Total Help needed standing up from a chair using your arms (e.g., wheelchair or bedside chair)?: Total Help needed to walk in hospital room?: Total Help needed climbing 3-5 steps with a railing? : Total 6 Click Score: 6    End of Session   Activity Tolerance: Patient tolerated treatment well Patient left: in bed;with call bell/phone within reach;with nursing/sitter in room Nurse Communication: Mobility status PT Visit Diagnosis: Unsteadiness on feet (R26.81);Muscle weakness (generalized) (M62.81);Other abnormalities of gait and mobility (R26.89);Difficulty in walking, not elsewhere classified (R26.2)    Time: 2831-5176 PT Time Calculation (min) (ACUTE ONLY): 24 min   Charges:   PT Evaluation $PT Eval Moderate Complexity: 1 Mod  Tye Vigo A. Gilford Rile PT, DPT [facility] - Acute Rehabilitation Services   Reve Crocket A Prim Morace 12/25/2022, 4:15 PM

## 2022-12-25 NOTE — Progress Notes (Signed)
Progress Note    REGHAN THUL  QHU:[id number] DOB: 1951-12-30  DOA: 12/17/2022 PCP: [facility]      Brief Narrative:    Medical records reviewed and are as summarized below:  TONY FRISCIA is a 71 y.o. female with medical history significant for bipolar disorder, schizophrenia, depression, hypertension, obesity, tubular adenoma of colon, who presented to the emergency department on 12/17/2022 with hypothermia and altered mental status.  According to EMS, she was non-verbal and that is not her baseline.  Reportedly, the heater in her home had stopped working on the night of 12/16/2022, and she was found the following morning with altered mental status.  ED course In the emergency room, rectal temperature was 31.8 F. BP 154/87/hr 36/ O2 sats 87% on RA.  Bear hugger applied and pt received 1L of warm saline due to severe hypothermia.  Lab results were: K+ 5.2/calcium 10.4/AST 73/ALT 129/total protein 6.3/wbc 3.2/platelet count 92.  COVID-19/Influenza A&B by PCR negative. EKG with sinus bradycardia. CT head with no acute abnormality (chronic findings noted). CT abdomen/pelvis showed findings of acute pancreatitis with early peripancreatic necrosis. There was also cholelithiasis (8 mm CBD) and a large gallstone in the neck of the gallbladder as well. Small volume ascites and bilateral pleural effusions were noted. There was also adrenal hyperplasia.   She was admitted to the hospital for acute pancreatitis with peripancreatic necrosis.  General surgeon and gastroenterologist were consulted, and they recommended conservative management.  She was treated with empiric IV antibiotics and IV fluids.  She developed asystole cardiac arrest on 12/18/2022,  around 2 AM in the morning.  She was intubated and placed on mechanical ventilation.  In the ICU, she required low-dose vasopressors.   Significant Hospital Events: Including procedures, antibiotic start and stop dates  in addition to other pertinent events   12/18: Pt admitted to ICU with acute metabolic encephalopathy, severe hypothermia (reported heater was nonfunctional at home) and sepsis secondary to pneumonia and acute pancreatitis  12/18: Overnight during rewarming pt became bradycardic and subsequently asystole requiring initiation of ACLS protocol and mechanical intubation prior to [place] 12/19: Pt had another episode of bradycardia followed by transient hypotension requiring continuous levophed gtt. Rewarmed. 12/20: increased intraabdominal pressures (measured at 40 mmHg). Continue rewarming. 12/21: intraabdominal pressure at 19 mmHg this morning. Awake and follows commands - SBT  She was transferred to [facility] hospitalist service on 12/21/2022.   Assessment/Plan:   Principal Problem:   Hypothermia Active Problems:   Acute pancreatitis   Cardiac arrest (HCC)   Calculus of gallbladder with acute cholecystitis without obstruction   Sepsis ([place])   Aspiration pneumonia (HCC)   Pressure injury of skin   SIRS (systemic inflammatory response syndrome) (HCC)   AKI (acute kidney injury) (HCC)   AV block, Mobitz 2   Non-traumatic compartment syndrome of abdomen   NSVT (nonsustained ventricular tachycardia) (HCC)   Hypernatremia   Epistaxis   Nutrition Problem: Inadequate oral intake   Signs/Symptoms: NPO status   Body mass index is 39.85 kg/m.  (Morbid obesity)   Acute pancreatitis with early peripancreatic necrosis, suspected aspiration pneumonia:  Continue dysphagia 1 diet as tolerated   Fever, suspected aspiration pneumonia given recent epistaxis: No growth on blood cultures thus far.  Continue IV Zosyn.      S/p cardiac arrest (asystole) on 12/19, NSVT Mobitz type II second-degree AV block, chronic diastolic CHF: Reengaged cardiologist because of concern for A-fib with RVR on telemetry and recent heart  block.  This was reviewed by Dr. Garen Lah, cardiologist on 12/25.  No A-fib. 2D  echo showed EF estimated at 60 to 09%, grade 3 diastolic dysfunction (restrictive), mild MR, mild to moderate TR   Acute hypoxic respiratory failure: Resolved.  She has been weaned off of oxygen.  S/p extubation on 12/20/2022.     Recurrent epistaxis: S/p nose packing on 12/22/2022.  She was evaluated by Dr. Richardson Landry, ENT physician.  Unfortunately,nose packs were dislodged after patient pulled on the packing.  She had a trickle of nosebleed after loss packs were removed.  Bleeding stopped spontaneously according to [place], Therapist, sports.  She informed Dr. Tami Ribas, ENT, recommended Afrin nasal spray and applying pressure as needed. Previously completed treatment with Afrin nasal spray on 12/23/2022   Hypertension: Continue antihypertensives.   Use IV hydralazine as needed for severe hypertension.   S/p distributive shock: She is off of vasopressors and IV hydrocortisone.     AKI: Improving.     Hypernatremia, poor oral intake, s/p hypoglycemia on 12/20/2022: Improved.  Discontinue IV fluids  Hypophosphatemia: Improved   Acute toxic metabolic encephalopathy: Slowly improving.  Continue supportive care.  EEG showed moderate to severe diffuse slowing indicating global cerebral dysfunction.  No epileptiform abnormalities were seen.   Thrombocytopenia: Improving  Diet Order             DIET - DYS 1 Room service appropriate? Yes with Assist; Fluid consistency: Nectar Thick  Diet effective now                            Consultants: [place] [facility] Gastroenterologist  Procedures: Intubation and mechanical ventilation on 12/18/2022 Right IJ central line on 12/17/2022    Medications:    amLODipine  10 mg Oral Daily   Chlorhexidine Gluconate Cloth  6 each Topical Q0600   heparin injection (subcutaneous)  5,000 Units Subcutaneous Q12H   hydrALAZINE  25 mg Oral BID   isosorbide mononitrate  60 mg Oral Daily   nystatin   Topical TID   mouth rinse  15 mL Mouth  Rinse 4 times per day   oxymetazoline  8 spray Each Nare BID   pantoprazole (PROTONIX) IV  40 mg Intravenous Daily   Continuous Infusions:  sodium chloride 10 mL/hr at 12/25/22 0700   piperacillin-tazobactam (ZOSYN)  IV 12.5 mL/hr at 12/25/22 0700     Anti-infectives (From admission, onward)    Start     Dose/Rate Route Frequency Ordered Stop   12/24/22 1730  piperacillin-tazobactam (ZOSYN) IVPB 3.375 g        3.375 g 12.5 mL/hr over 240 Minutes Intravenous Every 8 hours 12/24/22 1635     12/23/22 2200  piperacillin-tazobactam (ZOSYN) IVPB 3.375 g  Status:  Discontinued        3.375 g 12.5 mL/hr over 240 Minutes Intravenous Every 8 hours 12/23/22 0646 12/23/22 1132   12/22/22 2200  piperacillin-tazobactam (ZOSYN) IVPB 4.5 g  Status:  Discontinued        4.5 g 200 mL/hr over 30 Minutes Intravenous Every 8 hours 12/22/22 2105 12/23/22 1132   12/17/22 1600  piperacillin-tazobactam (ZOSYN) IVPB 3.375 g  Status:  Discontinued        3.375 g 12.5 mL/hr over 240 Minutes Intravenous Every 8 hours 12/17/22 1554 12/20/22 1019              Family Communication/Anticipated D/C date and plan/Code Status   DVT prophylaxis: Place and maintain sequential compression  device Start: 12/25/22 1024 heparin injection 5,000 Units Start: 12/18/22 2200 SCDs Start: 12/17/22 1520     Code Status: Full Code  Family Communication: None Disposition Plan: Plan to discharge to SNF   Status is: Inpatient Remains inpatient appropriate because: Encephalopathy       Subjective:   Interval events noted.  No fever overnight.  She is unable to provide any history. Andi Hence, RN, was at the bedside. Nose packs were in place at the time of my visit. Later in the day, Andi Hence, Therapist, sports, reported that patient had dislodged her nose packs.    Objective:    Vitals:   12/25/22 1000 12/25/22 1100 12/25/22 1112 12/25/22 1200  BP: 121/89 (!) 105/43 100/64 107/74  Pulse: (!) 109 (!) 112 (!) 107 (!) 105   Resp: '16 19 19 16  '$ Temp:    (!) 96.9 F (36.1 C)  TempSrc:    Axillary  SpO2: 98% 97% 95% 99%  Weight:      Height:       No data found.   Intake/Output Summary (Last 24 hours) at 12/25/2022 1247 Last data filed at 12/25/2022 0800 Gross per 24 hour  Intake 977.27 ml  Output 1600 ml  Net -622.73 ml   Filed Weights   12/22/22 0452 12/23/22 0500 12/24/22 0406  Weight: 117.1 kg 120.8 kg 122.4 kg    Exam:  GEN: NAD SKIN: Warm and dry EYES: No pallor or icterus ENT: MMM, nose packing in place CV: RRR PULM: CTA B ABD: soft, obese, NT, +BS CNS: Drowsy but arousable.  Confused.  Words are unintelligible EXT: No edema or tenderness    Pressure Injury 12/17/22 Foot Left Stage 2 -  Partial thickness loss of dermis presenting as a shallow open injury with a red, pink wound bed without slough. (Active)  12/17/22   Location: Foot  Location Orientation: Left  Staging: Stage 2 -  Partial thickness loss of dermis presenting as a shallow open injury with a red, pink wound bed without slough.  Wound Description (Comments):   Present on Admission:   Dressing Type Gauze (Comment) 12/24/22 0800     Data Reviewed:   I have personally reviewed following labs and imaging studies:  Labs: Labs show the following:   Basic Metabolic Panel: Recent Labs  Lab 12/20/22 0206 12/21/22 0515 12/22/22 0459 12/23/22 0114 12/24/22 0402 12/25/22 0500  NA 144 144 149* 146* 147* 145  K 4.2 4.0 4.3 4.5 4.0 4.0  CL 111 113* 115* 113* 115* 112*  CO2 '26 28 30 27 29 27  '$ GLUCOSE 124* 73 90 123* 122* 114*  BUN 45* 43* 40* 38* 31* 32*  CREATININE 2.46* 1.88* 1.47* 1.32* 1.29* 1.24*  CALCIUM 9.9 9.8 9.7 9.8 9.8 10.0  MG 2.5* 2.5* 2.7* 2.5* 2.3  --   PHOS 3.8 3.7 3.6 3.5 2.0* 3.5   GFR Estimated Creatinine Clearance: 58.3 mL/min (A) (by C-G formula based on SCr of 1.24 mg/dL (H)). Liver Function Tests: Recent Labs  Lab 12/21/22 0515 12/22/22 0459 12/23/22 0114 12/24/22 0402  12/25/22 0500  AST 52* 43* 37 35 34  ALT 165* 135* 113* 94* 97*  ALKPHOS 62 65 65 64 68  BILITOT 1.4* 1.5* 1.4* 1.1 1.7*  PROT 5.5* 5.5* 5.5* 5.5* 6.0*  ALBUMIN 2.7* 2.8* 2.7* 2.7* 2.8*   No results for input(s): "LIPASE", "AMYLASE" in the last 168 hours.  No results for input(s): "AMMONIA" in the last 168 hours. Coagulation profile Recent Labs  Lab 12/22/22 [phone]  INR 1.3*    CBC: Recent Labs  Lab 12/21/22 0515 12/22/22 0459 12/22/22 1450 12/22/22 1926 12/23/22 0114 12/24/22 0402 12/25/22 0500  WBC 5.7 4.5  --   --  5.1 5.3 7.0  NEUTROABS 3.9 2.7  --   --  3.9 3.5 5.0  HGB 10.0* 9.8* 9.9* 10.3* 9.4* 9.2* 9.6*  HCT 32.2* 31.5* 31.5* 33.4* 30.8* 29.7* 31.3*  MCV 89.7 92.1  --   --  91.1 91.4 91.5  PLT 56* 55*  --   --  56* 64* 96*   Cardiac Enzymes: No results for input(s): "CKTOTAL", "CKMB", "CKMBINDEX", "TROPONINI" in the last 168 hours.  BNP (last 3 results) No results for input(s): "PROBNP" in the last 8760 hours. CBG: Recent Labs  Lab 12/24/22 1609 12/24/22 2316 12/25/22 0320 12/25/22 0717 12/25/22 1150  GLUCAP 108* 122* 117* 130* 196*   D-Dimer: No results for input(s): "DDIMER" in the last 72 hours.  Hgb A1c: No results for input(s): "HGBA1C" in the last 72 hours. Lipid Profile: No results for input(s): "CHOL", "HDL", "LDLCALC", "TRIG", "CHOLHDL", "LDLDIRECT" in the last 72 hours. Thyroid function studies: No results for input(s): "TSH", "T4TOTAL", "T3FREE", "THYROIDAB" in the last 72 hours.  Invalid input(s): "FREET3" Anemia work up: No results for input(s): "VITAMINB12", "FOLATE", "FERRITIN", "TIBC", "IRON", "RETICCTPCT" in the last 72 hours. Sepsis Labs: Recent Labs  Lab 12/19/22 0409 12/20/22 1143 12/22/22 0459 12/23/22 0114 12/24/22 0402 12/24/22 1651 12/25/22 0120 12/25/22 0500  PROCALCITON 0.11  --   --   --   --  <0.10  --  0.13  WBC 8.2   < > 4.5 5.1 5.3  --   --  7.0  LATICACIDVEN 1.1  --   --   --   --  0.8 1.0  --    < > =  values in this interval not displayed.    Microbiology Recent Results (from the past 240 hour(s))  Blood Culture (routine x 2)     Status: None   Collection Time: 12/17/22  2:01 PM   Specimen: BLOOD  Result Value Ref Range Status   Specimen Description BLOOD RIGHT ANTECUBITAL  Final   Special Requests   Final    BOTTLES DRAWN AEROBIC AND ANAEROBIC Blood Culture adequate volume   Culture   Final    NO GROWTH 5 DAYS Performed at [facility], [address] [place], [place] [address]    Report Status 12/22/2022 FINAL  Final  Blood Culture (routine x 2)     Status: None   Collection Time: 12/17/22  2:01 PM   Specimen: BLOOD  Result Value Ref Range Status   Specimen Description BLOOD LEFT ANTECUBITAL  Final   Special Requests   Final    BOTTLES DRAWN AEROBIC AND ANAEROBIC Blood Culture results may not be optimal due to an inadequate volume of blood received in culture bottles   Culture   Final    NO GROWTH 5 DAYS Performed at [facility], [address], [place], [place] [address]    Report Status 12/22/2022 FINAL  Final  Resp Panel by RT-PCR (Flu A&B, Covid) Anterior Nasal Swab     Status: None   Collection Time: 12/17/22  2:06 PM   Specimen: Anterior Nasal Swab  Result Value Ref Range Status   SARS Coronavirus 2 by RT PCR NEGATIVE NEGATIVE Final    Comment: (NOTE) SARS-CoV-2 target nucleic acids are NOT DETECTED.  The SARS-CoV-2 RNA is generally detectable in upper respiratory specimens  during the acute phase of infection. The lowest concentration of SARS-CoV-2 viral copies this assay can detect is 138 copies/mL. A negative result does not preclude SARS-Cov-2 infection and should not be used as the sole basis for treatment or other patient management decisions. A negative result may occur with  improper specimen collection/handling, submission of specimen other than nasopharyngeal swab, presence of viral mutation(s) within the areas targeted by this  assay, and inadequate number of viral copies(<138 copies/mL). A negative result must be combined with clinical observations, patient history, and epidemiological information. The expected result is Negative.  Fact Sheet for Patients:  EntrepreneurPulse.com.au  Fact Sheet for Healthcare Providers:  IncredibleEmployment.be  This test is no t yet approved or cleared by the [place] FDA and  has been authorized for detection and/or diagnosis of SARS-CoV-2 by FDA under an Emergency Use Authorization (EUA). This EUA will remain  in effect (meaning this test can be used) for the duration of the COVID-19 declaration under Section 564(b)(1) of the Act, 21 U.S.C.section 360bbb-3(b)(1), unless the authorization is terminated  or revoked sooner.       Influenza A by PCR NEGATIVE NEGATIVE Final   Influenza B by PCR NEGATIVE NEGATIVE Final    Comment: (NOTE) The Xpert Xpress SARS-CoV-2/FLU/RSV plus assay is intended as an aid in the diagnosis of influenza from Nasopharyngeal swab specimens and should not be used as a sole basis for treatment. Nasal washings and aspirates are unacceptable for Xpert Xpress SARS-CoV-2/FLU/RSV testing.  Fact Sheet for Patients: EntrepreneurPulse.com.au  Fact Sheet for Healthcare Providers: IncredibleEmployment.be  This test is not yet approved or cleared by the [place] FDA and has been authorized for detection and/or diagnosis of SARS-CoV-2 by FDA under an Emergency Use Authorization (EUA). This EUA will remain in effect (meaning this test can be used) for the duration of the COVID-19 declaration under Section 564(b)(1) of the Act, 21 U.S.C. section 360bbb-3(b)(1), unless the authorization is terminated or revoked.  Performed at [facility], [address] [place], [place] [address]   Urine Culture     Status: None   Collection Time: 12/17/22  3:58 PM    Specimen: In/Out Cath Urine  Result Value Ref Range Status   Specimen Description   Final    IN/OUT CATH URINE Performed at [facility], [address] [place], [place] [address]    Special Requests   Final    NONE Performed at [facility], [address], [place], [place] [address]    Culture   Final    NO GROWTH Performed at [facility], [place] [address], [place], [place] [address]    Report Status 12/18/2022 FINAL  Final  MRSA Next Gen by PCR, Nasal     Status: None   Collection Time: 12/17/22  9:02 PM   Specimen: Nasal Mucosa; Nasal Swab  Result Value Ref Range Status   MRSA by PCR Next Gen NOT DETECTED NOT DETECTED Final    Comment: (NOTE) The GeneXpert MRSA Assay (FDA approved for NASAL specimens only), is one component of a comprehensive MRSA colonization surveillance program. It is not intended to diagnose MRSA infection nor to guide or monitor treatment for MRSA infections. Test performance is not FDA approved in patients less than 64 years old. Performed at [facility], [address] [place], [place] [address]   Culture, Respiratory w Gram Stain     Status: None   Collection Time: 12/18/22 11:37 AM   Specimen: Tracheal  Aspirate; Respiratory  Result Value Ref Range Status   Specimen Description   Final    TRACHEAL ASPIRATE Performed at [facility], [address] [place], [place] [address]    Special Requests   Final    NONE Performed at [facility], [address] [place], [place] [address]    Gram Stain   Final    RARE WBC PRESENT, PREDOMINANTLY PMN RARE BUDDING YEAST SEEN    Culture   Final    FEW Normal respiratory flora-no Staph aureus or Pseudomonas seen Performed at [facility] [address], [place], [place] [address]    Report Status 12/20/2022 FINAL  Final  Culture, blood (Routine X 2) w Reflex to ID Panel     Status: None (Preliminary result)   Collection Time: 12/24/22   4:58 PM   Specimen: BLOOD  Result Value Ref Range Status   Specimen Description BLOOD BLOOD LEFT HAND  Final   Special Requests   Final    BOTTLES DRAWN AEROBIC AND ANAEROBIC Blood Culture adequate volume   Culture   Final    NO GROWTH < 24 HOURS Performed at [facility], [address] [place], [place] [address]    Report Status PENDING  Incomplete  Culture, blood (Routine X 2) w Reflex to ID Panel     Status: None (Preliminary result)   Collection Time: 12/24/22  5:04 PM   Specimen: BLOOD  Result Value Ref Range Status   Specimen Description BLOOD BLOOD RIGHT HAND  Final   Special Requests   Final    BOTTLES DRAWN AEROBIC AND ANAEROBIC Blood Culture adequate volume   Culture   Final    NO GROWTH < 24 HOURS Performed at [facility], [address] [place], [place] [address]    Report Status PENDING  Incomplete    Procedures and diagnostic studies:  DG Chest Port 1 View  Result Date: 12/24/2022 CLINICAL DATA:  Fever EXAM: PORTABLE CHEST 1 VIEW COMPARISON:  Previous studies including the examination of 12/20/2022 FINDINGS: Transverse diameter of heart is increased. There are no signs of pulmonary edema. There is interval increase in opacity in right lower lung field. There is blunting of right lateral CP angle. There is no pneumothorax. There is interval removal of endotracheal tube and enteric tube. There is extrinsic pressure over the trachea in the lower neck, possibly due to enlarged thyroid. Tip of right IJ central venous catheter is seen at the junction of superior vena cava and right atrium. IMPRESSION: There is new increased density in right lower lung fields suggesting small pleural effusion and possibly underlying atelectasis/pneumonia. Electronically Signed   By: Elmer Picker M.D.   On: 12/24/2022 14:55               LOS: 8 days   Kenadi Miltner  [facility]   Pager on [email or web address]. If 7PM-7AM, please contact  night-coverage at [email or web address]     12/25/2022, 12:47 PM

## 2022-12-25 NOTE — Progress Notes (Addendum)
Patient left nare minimal trickle bleed. MD notified. ENT notified. New orders placed. Pressure held. Bleeding stopped.

## 2022-12-26 DIAGNOSIS — J690 Pneumonitis due to inhalation of food and vomit: Secondary | ICD-10-CM | POA: Diagnosis not present

## 2022-12-26 DIAGNOSIS — I469 Cardiac arrest, cause unspecified: Secondary | ICD-10-CM | POA: Diagnosis not present

## 2022-12-26 DIAGNOSIS — R040 Epistaxis: Secondary | ICD-10-CM | POA: Diagnosis not present

## 2022-12-26 DIAGNOSIS — K8511 Biliary acute pancreatitis with uninfected necrosis: Secondary | ICD-10-CM | POA: Diagnosis not present

## 2022-12-26 LAB — GLUCOSE, CAPILLARY
Glucose-Capillary: 100 mg/dL — ABNORMAL HIGH (ref 70–99)
Glucose-Capillary: 129 mg/dL — ABNORMAL HIGH (ref 70–99)
Glucose-Capillary: 130 mg/dL — ABNORMAL HIGH (ref 70–99)
Glucose-Capillary: 141 mg/dL — ABNORMAL HIGH (ref 70–99)
Glucose-Capillary: 89 mg/dL (ref 70–99)
Glucose-Capillary: 98 mg/dL (ref 70–99)

## 2022-12-26 LAB — PROCALCITONIN: Procalcitonin: <0.1 ng/mL

## 2022-12-26 MED ORDER — AMOXICILLIN-POT CLAVULANATE 875-125 MG PO TABS
1.0000 | ORAL_TABLET | Freq: Two times a day (BID) | ORAL | Status: AC
  Administered 2022-12-26 – 2022-12-28 (×5): 1 via ORAL
  Filled 2022-12-26 (×6): qty 1

## 2022-12-26 MED ORDER — PANTOPRAZOLE SODIUM 40 MG PO TBEC
40.0000 mg | DELAYED_RELEASE_TABLET | Freq: Every day | ORAL | Status: DC
  Administered 2022-12-26 – 2022-12-29 (×4): 40 mg via ORAL
  Filled 2022-12-26 (×4): qty 1

## 2022-12-26 NOTE — Progress Notes (Signed)
Pt has no obvious signs of respiratory distress, no Bipap needed at this time.

## 2022-12-26 NOTE — Progress Notes (Addendum)
Progress Note    Jill Smith  GGE:[id number] DOB: July 03, 1951  DOA: 12/17/2022 PCP: [facility] [place]      Brief Narrative:    Medical records reviewed and are as summarized below:  Jill Smith is a 71 y.o. female with medical history significant for bipolar disorder, schizophrenia, depression, hypertension, obesity, tubular adenoma of colon, who presented to the emergency department on 12/17/2022 with hypothermia and altered mental status.  According to EMS, she was non-verbal and that is not her baseline.  Reportedly, the heater in her home had stopped working on the night of 12/16/2022, and she was found the following morning with altered mental status.  ED course In the emergency room, rectal temperature was 31.8 F. BP 154/87/hr 36/ O2 sats 87% on RA.  Bear hugger applied and pt received 1L of warm saline due to severe hypothermia.  Lab results were: K+ 5.2/calcium 10.4/AST 73/ALT 129/total protein 6.3/wbc 3.2/platelet count 92.  COVID-19/Influenza A&B by PCR negative. EKG with sinus bradycardia. CT head with no acute abnormality (chronic findings noted). CT abdomen/pelvis showed findings of acute pancreatitis with early peripancreatic necrosis. There was also cholelithiasis (8 mm CBD) and a large gallstone in the neck of the gallbladder as well. Small volume ascites and bilateral pleural effusions were noted. There was also adrenal hyperplasia.   She was admitted to the hospital for acute pancreatitis with peripancreatic necrosis.  General surgeon and gastroenterologist were consulted, and they recommended conservative management.  She was treated with empiric IV antibiotics and IV fluids.  She developed asystole cardiac arrest on 12/18/2022,  around 2 AM in the morning.  She was intubated and placed on mechanical ventilation.  In the ICU, she required low-dose vasopressors.   Significant Hospital Events: Including procedures, antibiotic start and stop dates  in addition to other pertinent events   12/18: Pt admitted to ICU with acute metabolic encephalopathy, severe hypothermia (reported heater was nonfunctional at home) and sepsis secondary to pneumonia and acute pancreatitis  12/18: Overnight during rewarming pt became bradycardic and subsequently asystole requiring initiation of ACLS protocol and mechanical intubation prior to [place] 12/19: Pt had another episode of bradycardia followed by transient hypotension requiring continuous levophed gtt. Rewarmed. 12/20: increased intraabdominal pressures (measured at 40 mmHg). Continue rewarming. 12/21: intraabdominal pressure at 19 mmHg this morning. Awake and follows commands - SBT  She was transferred to [facility] hospitalist service on 12/21/2022.   Assessment/Plan:   Principal Problem:   Hypothermia Active Problems:   Acute pancreatitis   Cardiac arrest (HCC)   Calculus of gallbladder with acute cholecystitis without obstruction   Sepsis ([place])   Aspiration pneumonia (HCC)   Pressure injury of skin   SIRS (systemic inflammatory response syndrome) (HCC)   AKI (acute kidney injury) (HCC)   AV block, Mobitz 2   Non-traumatic compartment syndrome of abdomen   NSVT (nonsustained ventricular tachycardia) (HCC)   Hypernatremia   Epistaxis   Nutrition Problem: Inadequate oral intake   Signs/Symptoms: NPO status   Body mass index is 38.16 kg/m.  (Obesity)   Acute pancreatitis with early peripancreatic necrosis, cholelithiasis, suspected aspiration pneumonia: Continue dysphagia 1 diet as tolerated. Outpatient follow-up with general surgeon (Dr. Christian Mate) for further management of cholelithiasis.  [name], sister, is aware of this recommendation.   Fever, suspected aspiration pneumonia given recent epistaxis: Afebrile for more than 24 hours.  No growth on blood cultures.  Change IV Zosyn to Augmentin to complete 5 days of treatment.   S/p  cardiac arrest (asystole) on 12/19, NSVT Mobitz type II  second-degree AV block, chronic diastolic CHF: Reengaged cardiologist because of concern for A-fib with RVR on telemetry and recent heart block.  This was reviewed by Dr. Garen Lah, cardiologist on 12/25.  No A-fib. 2D echo showed EF estimated at 60 to 51%, grade 3 diastolic dysfunction (restrictive), mild MR, mild to moderate TR   Acute hypoxic respiratory failure: Resolved.  She has been weaned off of oxygen.  S/p extubation on 12/20/2022.     Recurrent epistaxis: S/p nose packing on 12/22/2022.  She was evaluated by Dr. Richardson Landry, ENT physician.  Unfortunately,nose packs were dislodged after patient pulled on the packing on 12/25/2022.  She had a trickle of nosebleed after loss packs were removed.  Bleeding stopped spontaneously according to [place], Therapist, sports.  She informed Dr. Tami Ribas, ENT, recommended Afrin nasal spray and applying pressure as needed. Previously completed treatment with Afrin nasal spray on 12/23/2022 Continue Afrin nasal spray through 12/27/2022.   Hypertension: BP is better.  Continue antihypertensives.   Use IV hydralazine as needed for severe hypertension.   S/p distributive shock: She is off of vasopressors and IV hydrocortisone.     AKI: Improving.     Hypernatremia, poor oral intake, s/p hypoglycemia on 12/20/2022: Improved.     Hypophosphatemia: Improved   Acute toxic metabolic encephalopathy: Slowly improving.  Continue supportive care.  EEG showed moderate to severe diffuse slowing indicating global cerebral dysfunction.  No epileptiform abnormalities were seen.   Thrombocytopenia: Improving   Remove right IJ central line   Diet Order             DIET - DYS 1 Room service appropriate? Yes with Assist; Fluid consistency: Nectar Thick  Diet effective now                            Consultants: [place] surgery Gastroenterologist  Procedures: Intubation and mechanical ventilation on 12/18/2022 Right IJ central line on  12/17/2022    Medications:    amLODipine  10 mg Oral Daily   vitamin C  500 mg Oral BID   Chlorhexidine Gluconate Cloth  6 each Topical Q0600   feeding supplement (NEPRO CARB STEADY)  237 mL Oral TID BM   heparin injection (subcutaneous)  5,000 Units Subcutaneous Q12H   hydrALAZINE  10 mg Oral BID   isosorbide mononitrate  60 mg Oral Daily   multivitamin with minerals  1 tablet Oral Daily   nystatin   Topical TID   mouth rinse  15 mL Mouth Rinse 4 times per day   oxymetazoline  8 spray Each Nare BID   pantoprazole  40 mg Oral Daily   Continuous Infusions:  sodium chloride 10 mL/hr at 12/26/22 1016   piperacillin-tazobactam (ZOSYN)  IV Stopped (12/26/22 0900)     Anti-infectives (From admission, onward)    Start     Dose/Rate Route Frequency Ordered Stop   12/24/22 1730  piperacillin-tazobactam (ZOSYN) IVPB 3.375 g        3.375 g 12.5 mL/hr over 240 Minutes Intravenous Every 8 hours 12/24/22 1635     12/23/22 2200  piperacillin-tazobactam (ZOSYN) IVPB 3.375 g  Status:  Discontinued        3.375 g 12.5 mL/hr over 240 Minutes Intravenous Every 8 hours 12/23/22 0646 12/23/22 1132   12/22/22 2200  piperacillin-tazobactam (ZOSYN) IVPB 4.5 g  Status:  Discontinued        4.5 g 200 mL/hr  over 30 Minutes Intravenous Every 8 hours 12/22/22 2105 12/23/22 1132   12/17/22 1600  piperacillin-tazobactam (ZOSYN) IVPB 3.375 g  Status:  Discontinued        3.375 g 12.5 mL/hr over 240 Minutes Intravenous Every 8 hours 12/17/22 1554 12/20/22 1019              Family Communication/Anticipated D/C date and plan/Code Status   DVT prophylaxis: Place and maintain sequential compression device Start: 12/25/22 1024 heparin injection 5,000 Units Start: 12/18/22 2200 SCDs Start: 12/17/22 1520     Code Status: Full Code  Family Communication: Plan discussed with [name], her sister Disposition Plan: Plan to discharge to SNF   Status is: Inpatient Remains inpatient appropriate  because: Encephalopathy       Subjective:   Interval events noted.  No fever overnight.  She is confused and unable to provide any history.  Objective:    Vitals:   12/26/22 0815 12/26/22 0900 12/26/22 1000 12/26/22 1100  BP: 132/63 (!) 118/39 (!) 104/50 114/65  Pulse: 97 71 89 92  Resp: 12 (!) '9 11 16  '$ Temp: 99 F (37.2 C)     TempSrc: Axillary     SpO2: 99% 96% 97% 95%  Weight:      Height:       No data found.   Intake/Output Summary (Last 24 hours) at 12/26/2022 1126 Last data filed at 12/26/2022 1016 Gross per 24 hour  Intake 1153.86 ml  Output 2950 ml  Net -1796.14 ml   Filed Weights   12/23/22 0500 12/24/22 0406 12/26/22 0449  Weight: 120.8 kg 122.4 kg 117.2 kg    Exam:  GEN: NAD SKIN: Warm and dry EYES: EOMI ENT: MMM CV: RRR PULM: CTA B ABD: soft, obese, NT, +BS CNS: Drowsy but arousable, confused, utters a few  unintelligible words EXT: No edema or tenderness     Pressure Injury 12/17/22 Foot Left Stage 2 -  Partial thickness loss of dermis presenting as a shallow open injury with a red, pink wound bed without slough. (Active)  12/17/22   Location: Foot  Location Orientation: Left  Staging: Stage 2 -  Partial thickness loss of dermis presenting as a shallow open injury with a red, pink wound bed without slough.  Wound Description (Comments):   Present on Admission:   Dressing Type None 12/26/22 0410     Data Reviewed:   I have personally reviewed following labs and imaging studies:  Labs: Labs show the following:   Basic Metabolic Panel: Recent Labs  Lab 12/21/22 0515 12/22/22 0459 12/23/22 0114 12/24/22 0402 12/25/22 0500  NA 144 149* 146* 147* 145  K 4.0 4.3 4.5 4.0 4.0  CL 113* 115* 113* 115* 112*  CO2 '28 30 27 29 27  '$ GLUCOSE 73 90 123* 122* 114*  BUN 43* 40* 38* 31* 32*  CREATININE 1.88* 1.47* 1.32* 1.29* 1.24*  CALCIUM 9.8 9.7 9.8 9.8 10.0  MG 2.5* 2.7* 2.5* 2.3 2.3  PHOS 3.7 3.6 3.5 2.0* 3.5   GFR Estimated  Creatinine Clearance: 56.9 mL/min (A) (by C-G formula based on SCr of 1.24 mg/dL (H)). Liver Function Tests: Recent Labs  Lab 12/21/22 0515 12/22/22 0459 12/23/22 0114 12/24/22 0402 12/25/22 0500  AST 52* 43* 37 35 34  ALT 165* 135* 113* 94* 97*  ALKPHOS 62 65 65 64 68  BILITOT 1.4* 1.5* 1.4* 1.1 1.7*  PROT 5.5* 5.5* 5.5* 5.5* 6.0*  ALBUMIN 2.7* 2.8* 2.7* 2.7* 2.8*   No results for  input(s): "LIPASE", "AMYLASE" in the last 168 hours.  No results for input(s): "AMMONIA" in the last 168 hours. Coagulation profile Recent Labs  Lab 12/22/22 0608  INR 1.3*    CBC: Recent Labs  Lab 12/21/22 0515 12/22/22 0459 12/22/22 1450 12/22/22 1926 12/23/22 0114 12/24/22 0402 12/25/22 0500  WBC 5.7 4.5  --   --  5.1 5.3 7.0  NEUTROABS 3.9 2.7  --   --  3.9 3.5 5.0  HGB 10.0* 9.8* 9.9* 10.3* 9.4* 9.2* 9.6*  HCT 32.2* 31.5* 31.5* 33.4* 30.8* 29.7* 31.3*  MCV 89.7 92.1  --   --  91.1 91.4 91.5  PLT 56* 55*  --   --  56* 64* 96*   Cardiac Enzymes: No results for input(s): "CKTOTAL", "CKMB", "CKMBINDEX", "TROPONINI" in the last 168 hours.  BNP (last 3 results) No results for input(s): "PROBNP" in the last 8760 hours. CBG: Recent Labs  Lab 12/25/22 1653 12/25/22 1922 12/25/22 2306 12/26/22 0324 12/26/22 0758  GLUCAP 113* 134* 121* 100* 89   D-Dimer: No results for input(s): "DDIMER" in the last 72 hours.  Hgb A1c: No results for input(s): "HGBA1C" in the last 72 hours. Lipid Profile: No results for input(s): "CHOL", "HDL", "LDLCALC", "TRIG", "CHOLHDL", "LDLDIRECT" in the last 72 hours. Thyroid function studies: No results for input(s): "TSH", "T4TOTAL", "T3FREE", "THYROIDAB" in the last 72 hours.  Invalid input(s): "FREET3" Anemia work up: No results for input(s): "VITAMINB12", "FOLATE", "FERRITIN", "TIBC", "IRON", "RETICCTPCT" in the last 72 hours. Sepsis Labs: Recent Labs  Lab 12/22/22 0459 12/23/22 0114 12/24/22 0402 12/24/22 1651 12/25/22 0120 12/25/22 0500  12/26/22 0451  PROCALCITON  --   --   --  <0.10  --  0.13 <0.10  WBC 4.5 5.1 5.3  --   --  7.0  --   LATICACIDVEN  --   --   --  0.8 1.0  --   --     Microbiology Recent Results (from the past 240 hour(s))  Blood Culture (routine x 2)     Status: None   Collection Time: 12/17/22  2:01 PM   Specimen: BLOOD  Result Value Ref Range Status   Specimen Description BLOOD RIGHT ANTECUBITAL  Final   Special Requests   Final    BOTTLES DRAWN AEROBIC AND ANAEROBIC Blood Culture adequate volume   Culture   Final    NO GROWTH 5 DAYS Performed at [facility], [address] [place], [place] [address]    Report Status 12/22/2022 FINAL  Final  Blood Culture (routine x 2)     Status: None   Collection Time: 12/17/22  2:01 PM   Specimen: BLOOD  Result Value Ref Range Status   Specimen Description BLOOD LEFT ANTECUBITAL  Final   Special Requests   Final    BOTTLES DRAWN AEROBIC AND ANAEROBIC Blood Culture results may not be optimal due to an inadequate volume of blood received in culture bottles   Culture   Final    NO GROWTH 5 DAYS Performed at [facility], [address] [place], [place] [address]    Report Status 12/22/2022 FINAL  Final  Resp Panel by RT-PCR (Flu A&B, Covid) Anterior Nasal Swab     Status: None   Collection Time: 12/17/22  2:06 PM   Specimen: Anterior Nasal Swab  Result Value Ref Range Status   SARS Coronavirus 2 by RT PCR NEGATIVE NEGATIVE Final    Comment: (NOTE) SARS-CoV-2 target nucleic acids are NOT DETECTED.  The  SARS-CoV-2 RNA is generally detectable in upper respiratory specimens during the acute phase of infection. The lowest concentration of SARS-CoV-2 viral copies this assay can detect is 138 copies/mL. A negative result does not preclude SARS-Cov-2 infection and should not be used as the sole basis for treatment or other patient management decisions. A negative result may occur with  improper specimen collection/handling, submission  of specimen other than nasopharyngeal swab, presence of viral mutation(s) within the areas targeted by this assay, and inadequate number of viral copies(<138 copies/mL). A negative result must be combined with clinical observations, patient history, and epidemiological information. The expected result is Negative.  Fact Sheet for Patients:  EntrepreneurPulse.com.au  Fact Sheet for Healthcare Providers:  IncredibleEmployment.be  This test is no t yet approved or cleared by the [place] FDA and  has been authorized for detection and/or diagnosis of SARS-CoV-2 by FDA under an Emergency Use Authorization (EUA). This EUA will remain  in effect (meaning this test can be used) for the duration of the COVID-19 declaration under Section 564(b)(1) of the Act, 21 U.S.C.section 360bbb-3(b)(1), unless the authorization is terminated  or revoked sooner.       Influenza A by PCR NEGATIVE NEGATIVE Final   Influenza B by PCR NEGATIVE NEGATIVE Final    Comment: (NOTE) The Xpert Xpress SARS-CoV-2/FLU/RSV plus assay is intended as an aid in the diagnosis of influenza from Nasopharyngeal swab specimens and should not be used as a sole basis for treatment. Nasal washings and aspirates are unacceptable for Xpert Xpress SARS-CoV-2/FLU/RSV testing.  Fact Sheet for Patients: EntrepreneurPulse.com.au  Fact Sheet for Healthcare Providers: IncredibleEmployment.be  This test is not yet approved or cleared by the [place] FDA and has been authorized for detection and/or diagnosis of SARS-CoV-2 by FDA under an Emergency Use Authorization (EUA). This EUA will remain in effect (meaning this test can be used) for the duration of the COVID-19 declaration under Section 564(b)(1) of the Act, 21 U.S.C. section 360bbb-3(b)(1), unless the authorization is terminated or revoked.  Performed at [facility], [address], [place], [place] [address]   Urine Culture     Status: None   Collection Time: 12/17/22  3:58 PM   Specimen: In/Out Cath Urine  Result Value Ref Range Status   Specimen Description   Final    IN/OUT CATH URINE Performed at [facility], [address] [place], [place] [address]    Special Requests   Final    NONE Performed at [facility], [address] [place], [place] [address]    Culture   Final    NO GROWTH Performed at [facility], [place] [address] [place], [place] [address]    Report Status 12/18/2022 FINAL  Final  MRSA Next Gen by PCR, Nasal     Status: None   Collection Time: 12/17/22  9:02 PM   Specimen: Nasal Mucosa; Nasal Swab  Result Value Ref Range Status   MRSA by PCR Next Gen NOT DETECTED NOT DETECTED Final    Comment: (NOTE) The GeneXpert MRSA Assay (FDA approved for NASAL specimens only), is one component of a comprehensive MRSA colonization surveillance program. It is not intended to diagnose MRSA infection nor to guide or monitor treatment for MRSA infections. Test performance is not FDA approved in patients less than 34 years old. Performed at [facility], [address] [place], [place] [address]   Culture, Respiratory w Gram Stain     Status: None  Collection Time: 12/18/22 11:37 AM   Specimen: Tracheal Aspirate; Respiratory  Result Value Ref Range Status   Specimen Description   Final    TRACHEAL ASPIRATE Performed at [facility], [address], [place], [place] [address]    Special Requests   Final    NONE Performed at [facility], [address] [place], [place] [address]    Gram Stain   Final    RARE WBC PRESENT, PREDOMINANTLY PMN RARE BUDDING YEAST SEEN    Culture   Final    FEW Normal respiratory flora-no Staph aureus or Pseudomonas seen Performed at [facility] [address], [place], [place] [address]    Report Status 12/20/2022 FINAL  Final  Culture, blood  (Routine X 2) w Reflex to ID Panel     Status: None (Preliminary result)   Collection Time: 12/24/22  4:58 PM   Specimen: BLOOD  Result Value Ref Range Status   Specimen Description BLOOD BLOOD LEFT HAND  Final   Special Requests   Final    BOTTLES DRAWN AEROBIC AND ANAEROBIC Blood Culture adequate volume   Culture   Final    NO GROWTH 2 DAYS Performed at [facility], [address] [place], [place] [address]    Report Status PENDING  Incomplete  Culture, blood (Routine X 2) w Reflex to ID Panel     Status: None (Preliminary result)   Collection Time: 12/24/22  5:04 PM   Specimen: BLOOD  Result Value Ref Range Status   Specimen Description BLOOD BLOOD RIGHT HAND  Final   Special Requests   Final    BOTTLES DRAWN AEROBIC AND ANAEROBIC Blood Culture adequate volume   Culture   Final    NO GROWTH 2 DAYS Performed at [facility], [address] [place], [place] [address]    Report Status PENDING  Incomplete    Procedures and diagnostic studies:  DG Chest Port 1 View  Result Date: 12/24/2022 CLINICAL DATA:  Fever EXAM: PORTABLE CHEST 1 VIEW COMPARISON:  Previous studies including the examination of 12/20/2022 FINDINGS: Transverse diameter of heart is increased. There are no signs of pulmonary edema. There is interval increase in opacity in right lower lung field. There is blunting of right lateral CP angle. There is no pneumothorax. There is interval removal of endotracheal tube and enteric tube. There is extrinsic pressure over the trachea in the lower neck, possibly due to enlarged thyroid. Tip of right IJ central venous catheter is seen at the junction of superior vena cava and right atrium. IMPRESSION: There is new increased density in right lower lung fields suggesting small pleural effusion and possibly underlying atelectasis/pneumonia. Electronically Signed   By: Elmer Picker M.D.   On: 12/24/2022 14:55               LOS: 9 days   Harneet Noblett  [facility]   Pager on [email or web address]. If 7PM-7AM, please contact night-coverage at [email or web address]     12/26/2022, 11:26 AM

## 2022-12-26 NOTE — Progress Notes (Signed)
Report given to [facility] and patient transported to room 248 via bed on telemetry by this RN.

## 2022-12-27 DIAGNOSIS — K8511 Biliary acute pancreatitis with uninfected necrosis: Secondary | ICD-10-CM | POA: Diagnosis not present

## 2022-12-27 DIAGNOSIS — I469 Cardiac arrest, cause unspecified: Secondary | ICD-10-CM | POA: Diagnosis not present

## 2022-12-27 DIAGNOSIS — J690 Pneumonitis due to inhalation of food and vomit: Secondary | ICD-10-CM | POA: Diagnosis not present

## 2022-12-27 LAB — BASIC METABOLIC PANEL
Anion gap: 6 (ref 5–15)
BUN: 40 mg/dL — ABNORMAL HIGH (ref 8–23)
CO2: 24 mmol/L (ref 22–32)
Calcium: 10.1 mg/dL (ref 8.9–10.3)
Chloride: 115 mmol/L — ABNORMAL HIGH (ref 98–111)
Creatinine, Ser: 1.28 mg/dL — ABNORMAL HIGH (ref 0.44–1.00)
GFR, Estimated: 45 mL/min — ABNORMAL LOW (ref >60)
Glucose, Bld: 85 mg/dL (ref 70–99)
Potassium: 4 mmol/L (ref 3.5–5.1)
Sodium: 145 mmol/L (ref 135–145)

## 2022-12-27 LAB — CBC WITH DIFFERENTIAL/PLATELET
Abs Immature Granulocytes: 0.03 10*3/uL (ref 0.00–0.07)
Basophils Absolute: 0 10*3/uL (ref 0.0–0.1)
Basophils Relative: 0 %
Eosinophils Absolute: 0.1 10*3/uL (ref 0.0–0.5)
Eosinophils Relative: 1 %
HCT: 28.3 % — ABNORMAL LOW (ref 36.0–46.0)
Hemoglobin: 8.7 g/dL — ABNORMAL LOW (ref 12.0–15.0)
Immature Granulocytes: 0 %
Lymphocytes Relative: 22 %
Lymphs Abs: 1.6 10*3/uL (ref 0.7–4.0)
MCH: 28 pg (ref 26.0–34.0)
MCHC: 30.7 g/dL (ref 30.0–36.0)
MCV: 91 fL (ref 80.0–100.0)
Monocytes Absolute: 0.9 10*3/uL (ref 0.1–1.0)
Monocytes Relative: 12 %
Neutro Abs: 4.5 10*3/uL (ref 1.7–7.7)
Neutrophils Relative %: 65 %
Platelets: 185 10*3/uL (ref 150–400)
RBC: 3.11 MIL/uL — ABNORMAL LOW (ref 3.87–5.11)
RDW: 14.6 % (ref 11.5–15.5)
WBC: 7.1 10*3/uL (ref 4.0–10.5)
nRBC: 0 % (ref 0.0–0.2)

## 2022-12-27 LAB — GLUCOSE, CAPILLARY
Glucose-Capillary: 85 mg/dL (ref 70–99)
Glucose-Capillary: 84 mg/dL (ref 70–99)
Glucose-Capillary: 134 mg/dL — ABNORMAL HIGH (ref 70–99)
Glucose-Capillary: 129 mg/dL — ABNORMAL HIGH (ref 70–99)
Glucose-Capillary: 135 mg/dL — ABNORMAL HIGH (ref 70–99)

## 2022-12-27 NOTE — Progress Notes (Addendum)
Progress Note    Jill Smith  WVP:[id number] DOB: [birth year]  DOA: 12/17/2022 PCP: [facility]      Brief Narrative:    Medical records reviewed and are as summarized below:  Jill Smith is a 71 y.o. female with medical history significant for bipolar disorder, schizophrenia, depression, hypertension, obesity, tubular adenoma of colon, who presented to the emergency department on 12/17/2022 with hypothermia and altered mental status.  According to EMS, she was non-verbal and that is not her baseline.  Reportedly, the heater in her home had stopped working on the night of 12/16/2022, and she was found the following morning with altered mental status.  ED course In the emergency room, rectal temperature was 31.8 F. BP 154/87/hr 36/ O2 sats 87% on RA.  Bear hugger applied and pt received 1L of warm saline due to severe hypothermia.  Lab results were: K+ 5.2/calcium 10.4/AST 73/ALT 129/total protein 6.3/wbc 3.2/platelet count 92.  COVID-19/Influenza A&B by PCR negative. EKG with sinus bradycardia. CT head with no acute abnormality (chronic findings noted). CT abdomen/pelvis showed findings of acute pancreatitis with early peripancreatic necrosis. There was also cholelithiasis (8 mm CBD) and a large gallstone in the neck of the gallbladder as well. Small volume ascites and bilateral pleural effusions were noted. There was also adrenal hyperplasia.   She was admitted to the hospital for acute pancreatitis with peripancreatic necrosis.  General surgeon and gastroenterologist were consulted, and they recommended conservative management.  She was treated with empiric IV antibiotics and IV fluids.  She developed asystole cardiac arrest on 12/18/2022,  around 2 AM in the morning.  She was intubated and placed on mechanical ventilation.  In the ICU, she required low-dose vasopressors.   Significant Hospital Events: Including procedures, antibiotic start and stop dates  in addition to other pertinent events   12/18: Pt admitted to ICU with acute metabolic encephalopathy, severe hypothermia (reported heater was nonfunctional at home) and sepsis secondary to pneumonia and acute pancreatitis  12/18: Overnight during rewarming pt became bradycardic and subsequently asystole requiring initiation of ACLS protocol and mechanical intubation prior to [place] 12/19: Pt had another episode of bradycardia followed by transient hypotension requiring continuous levophed gtt. Rewarmed. 12/20: increased intraabdominal pressures (measured at 40 mmHg). Continue rewarming. 12/21: intraabdominal pressure at 19 mmHg this morning. Awake and follows commands - SBT  She was transferred to [facility] hospitalist service on 12/21/2022.   Assessment/Plan:   Principal Problem:   Hypothermia Active Problems:   Acute pancreatitis   Cardiac arrest (HCC)   Calculus of gallbladder with acute cholecystitis without obstruction   Sepsis ([place])   Aspiration pneumonia (HCC)   Pressure injury of skin   SIRS (systemic inflammatory response syndrome) (HCC)   AKI (acute kidney injury) (HCC)   AV block, Mobitz 2   Non-traumatic compartment syndrome of abdomen   NSVT (nonsustained ventricular tachycardia) (HCC)   Hypernatremia   Epistaxis   Nutrition Problem: Inadequate oral intake   Signs/Symptoms: NPO status   Body mass index is 38.16 kg/m.  (Obesity)   Acute pancreatitis with early peripancreatic necrosis, cholelithiasis, suspected aspiration pneumonia: She is tolerating dysphagia 1 diet. Outpatient follow-up with general surgeon (Dr. Christian Mate) for further management of cholelithiasis.  Jill Smith, sister, is aware of this recommendation.   Fever, suspected aspiration pneumonia given recent epistaxis: Continue Augmentin to complete 5 days of antibiotics.   S/p cardiac arrest (asystole) on 12/19, NSVT Mobitz type II second-degree AV block, chronic diastolic CHF: Reengaged  cardiologist  because of concern for A-fib with RVR on telemetry and recent heart block.  This was reviewed by Dr. Garen Lah, cardiologist on 12/25.  No A-fib. 2D echo showed EF estimated at 60 to 22%, grade 3 diastolic dysfunction (restrictive), mild MR, mild to moderate TR   Acute hypoxic respiratory failure: Resolved.  She has been weaned off of oxygen.  S/p extubation on 12/20/2022.     Recurrent epistaxis: S/p nose packing on 12/22/2022.  She was evaluated by Dr. Richardson Landry, ENT physician.  Unfortunately,nose packs were dislodged after patient pulled on the packing on 12/25/2022.  She had a trickle of nosebleed after nose packs were removed.  Bleeding stopped spontaneously according to [place], Therapist, sports.  She informed Dr. Tami Ribas, ENT, recommended Afrin nasal spray and applying pressure as needed. Previously completed treatment with Afrin nasal spray on 12/23/2022 Okay to discontinue Afrin nasal spray.   Hypertension: Continue antihypertensives.  Monitor BP closely and adjust antihypertensives as needed.   Use IV hydralazine as needed for severe hypertension.   S/p distributive shock: She is off of vasopressors and IV hydrocortisone.      Hypernatremia, poor oral intake, s/p hypoglycemia on 12/20/2022: Improved.     Acute toxic metabolic encephalopathy: Slowly improving.  Continue supportive care.  EEG showed moderate to severe diffuse slowing indicating global cerebral dysfunction.  No epileptiform abnormalities were seen.   AKI, hypophosphatemia, thrombocytopenia: Improved   Right IJ central line was removed on 12/26/2022  Plan discussed with Jill Smith, sister.  She said she prefers to take patient home with home health rather than go to SNF. She understands that patient is at risk for falls but she said she can manage her at home.  She was informed that patient will be ready for discharge tomorrow.  However, she said she needs some time to set up her room in order to be able to receive patient at home.   She requested for more time and requested discharge on Saturday, 12/29/2022.   Diet Order             DIET - DYS 1 Room service appropriate? Yes with Assist; Fluid consistency: Nectar Thick  Diet effective now                            Consultants: [place] surgery Gastroenterologist  Procedures: Intubation and mechanical ventilation on 12/18/2022 Right IJ central line on 12/17/2022    Medications:    amLODipine  10 mg Oral Daily   amoxicillin-clavulanate  1 tablet Oral Q12H   vitamin C  500 mg Oral BID   Chlorhexidine Gluconate Cloth  6 each Topical Q0600   feeding supplement (NEPRO CARB STEADY)  237 mL Oral TID BM   heparin injection (subcutaneous)  5,000 Units Subcutaneous Q12H   isosorbide mononitrate  60 mg Oral Daily   multivitamin with minerals  1 tablet Oral Daily   nystatin   Topical TID   mouth rinse  15 mL Mouth Rinse 4 times per day   oxymetazoline  8 spray Each Nare BID   pantoprazole  40 mg Oral Daily   Continuous Infusions:  sodium chloride Stopped (12/26/22 1210)     Anti-infectives (From admission, onward)    Start     Dose/Rate Route Frequency Ordered Stop   12/26/22 2000  amoxicillin-clavulanate (AUGMENTIN) 875-125 MG per tablet 1 tablet        1 tablet Oral Every 12 hours 12/26/22 1146 12/29/22 0759  12/24/22 1730  piperacillin-tazobactam (ZOSYN) IVPB 3.375 g  Status:  Discontinued        3.375 g 12.5 mL/hr over 240 Minutes Intravenous Every 8 hours 12/24/22 1635 12/26/22 1146   12/23/22 2200  piperacillin-tazobactam (ZOSYN) IVPB 3.375 g  Status:  Discontinued        3.375 g 12.5 mL/hr over 240 Minutes Intravenous Every 8 hours 12/23/22 0646 12/23/22 1132   12/22/22 2200  piperacillin-tazobactam (ZOSYN) IVPB 4.5 g  Status:  Discontinued        4.5 g 200 mL/hr over 30 Minutes Intravenous Every 8 hours 12/22/22 2105 12/23/22 1132   12/17/22 1600  piperacillin-tazobactam (ZOSYN) IVPB 3.375 g  Status:  Discontinued         3.375 g 12.5 mL/hr over 240 Minutes Intravenous Every 8 hours 12/17/22 1554 12/20/22 1019              Family Communication/Anticipated D/C date and plan/Code Status   DVT prophylaxis: Place and maintain sequential compression device Start: 12/25/22 1024 heparin injection 5,000 Units Start: 12/18/22 2200 SCDs Start: 12/17/22 1520     Code Status: Full Code  Family Communication: Jill Smith, sister Disposition Plan: Plan to discharge to SNF   Status is: Inpatient Remains inpatient appropriate because: Will need SNF at discharge       Subjective:   Interval events noted.  Patient is more awake and communicative today. Elisa, RN, was at the bedside  Objective:    Vitals:   12/26/22 2313 12/27/22 0332 12/27/22 0748 12/27/22 1212  BP: (!) 146/85 (!) 147/71 (!) 145/69 (!) 98/58  Pulse: 89 86 79 86  Resp:   12 14  Temp: 98.4 F (36.9 C) 97.6 F (36.4 C) 98 F (36.7 C) 98.2 F (36.8 C)  TempSrc: Oral Oral    SpO2: 97% 95% 97% 97%  Weight:      Height:       No data found.   Intake/Output Summary (Last 24 hours) at 12/27/2022 1259 Last data filed at 12/27/2022 0300 Gross per 24 hour  Intake 600 ml  Output 900 ml  Net -300 ml   Filed Weights   12/23/22 0500 12/24/22 0406 12/26/22 0449  Weight: 120.8 kg 122.4 kg 117.2 kg    Exam:  GEN: NAD SKIN: Warm and dry EYES: No pallor or icterus ENT: MMM CV: RRR PULM: CTA B ABD: soft, obese, NT, +BS CNS: AAO x 2 (person and place) EXT: No edema or tenderness      Pressure Injury 12/17/22 Foot Left Stage 2 -  Partial thickness loss of dermis presenting as a shallow open injury with a red, pink wound bed without slough. (Active)  12/17/22   Location: Foot  Location Orientation: Left  Staging: Stage 2 -  Partial thickness loss of dermis presenting as a shallow open injury with a red, pink wound bed without slough.  Wound Description (Comments):   Present on Admission:   Dressing Type None 12/27/22  0930     Data Reviewed:   I have personally reviewed following labs and imaging studies:  Labs: Labs show the following:   Basic Metabolic Panel: Recent Labs  Lab 12/21/22 0515 12/22/22 0459 12/23/22 0114 12/24/22 0402 12/25/22 0500 12/27/22 0519  NA 144 149* 146* 147* 145 145  K 4.0 4.3 4.5 4.0 4.0 4.0  CL 113* 115* 113* 115* 112* 115*  CO2 '28 30 27 29 27 24  '$ GLUCOSE 73 90 123* 122* 114* 85  BUN 43* 40* 38* 31*  32* 40*  CREATININE 1.88* 1.47* 1.32* 1.29* 1.24* 1.28*  CALCIUM 9.8 9.7 9.8 9.8 10.0 10.1  MG 2.5* 2.7* 2.5* 2.3 2.3  --   PHOS 3.7 3.6 3.5 2.0* 3.5  --    GFR Estimated Creatinine Clearance: 55.1 mL/min (A) (by C-G formula based on SCr of 1.28 mg/dL (H)). Liver Function Tests: Recent Labs  Lab 12/21/22 0515 12/22/22 0459 12/23/22 0114 12/24/22 0402 12/25/22 0500  AST 52* 43* 37 35 34  ALT 165* 135* 113* 94* 97*  ALKPHOS 62 65 65 64 68  BILITOT 1.4* 1.5* 1.4* 1.1 1.7*  PROT 5.5* 5.5* 5.5* 5.5* 6.0*  ALBUMIN 2.7* 2.8* 2.7* 2.7* 2.8*   No results for input(s): "LIPASE", "AMYLASE" in the last 168 hours.  No results for input(s): "AMMONIA" in the last 168 hours. Coagulation profile Recent Labs  Lab 12/22/22 0608  INR 1.3*    CBC: Recent Labs  Lab 12/22/22 0459 12/22/22 1450 12/22/22 1926 12/23/22 0114 12/24/22 0402 12/25/22 0500 12/27/22 0519  WBC 4.5  --   --  5.1 5.3 7.0 7.1  NEUTROABS 2.7  --   --  3.9 3.5 5.0 4.5  HGB 9.8*   < > 10.3* 9.4* 9.2* 9.6* 8.7*  HCT 31.5*   < > 33.4* 30.8* 29.7* 31.3* 28.3*  MCV 92.1  --   --  91.1 91.4 91.5 91.0  PLT 55*  --   --  56* 64* 96* 185   < > = values in this interval not displayed.   Cardiac Enzymes: No results for input(s): "CKTOTAL", "CKMB", "CKMBINDEX", "TROPONINI" in the last 168 hours.  BNP (last 3 results) No results for input(s): "PROBNP" in the last 8760 hours. CBG: Recent Labs  Lab 12/26/22 2002 12/26/22 2037 12/26/22 2335 12/27/22 0346 12/27/22 0750  GLUCAP 141* 130* 98  85 84   D-Dimer: No results for input(s): "DDIMER" in the last 72 hours.  Hgb A1c: No results for input(s): "HGBA1C" in the last 72 hours. Lipid Profile: No results for input(s): "CHOL", "HDL", "LDLCALC", "TRIG", "CHOLHDL", "LDLDIRECT" in the last 72 hours. Thyroid function studies: No results for input(s): "TSH", "T4TOTAL", "T3FREE", "THYROIDAB" in the last 72 hours.  Invalid input(s): "FREET3" Anemia work up: No results for input(s): "VITAMINB12", "FOLATE", "FERRITIN", "TIBC", "IRON", "RETICCTPCT" in the last 72 hours. Sepsis Labs: Recent Labs  Lab 12/23/22 0114 12/24/22 0402 12/24/22 1651 12/25/22 0120 12/25/22 0500 12/26/22 0451 12/27/22 0519  PROCALCITON  --   --  <0.10  --  0.13 <0.10  --   WBC 5.1 5.3  --   --  7.0  --  7.1  LATICACIDVEN  --   --  0.8 1.0  --   --   --     Microbiology Recent Results (from the past 240 hour(s))  Blood Culture (routine x 2)     Status: None   Collection Time: 12/17/22  2:01 PM   Specimen: BLOOD  Result Value Ref Range Status   Specimen Description BLOOD RIGHT ANTECUBITAL  Final   Special Requests   Final    BOTTLES DRAWN AEROBIC AND ANAEROBIC Blood Culture adequate volume   Culture   Final    NO GROWTH 5 DAYS Performed at [facility], [address], [place], [place] [address]    Report Status 12/22/2022 FINAL  Final  Blood Culture (routine x 2)     Status: None   Collection Time: 12/17/22  2:01 PM   Specimen: BLOOD  Result Value Ref Range Status  Specimen Description BLOOD LEFT ANTECUBITAL  Final   Special Requests   Final    BOTTLES DRAWN AEROBIC AND ANAEROBIC Blood Culture results may not be optimal due to an inadequate volume of blood received in culture bottles   Culture   Final    NO GROWTH 5 DAYS Performed at [facility], [address] [place], [place] [address]    Report Status 12/22/2022 FINAL  Final  Resp Panel by RT-PCR (Flu A&B, Covid) Anterior Nasal Swab     Status: None   Collection  Time: 12/17/22  2:06 PM   Specimen: Anterior Nasal Swab  Result Value Ref Range Status   SARS Coronavirus 2 by RT PCR NEGATIVE NEGATIVE Final    Comment: (NOTE) SARS-CoV-2 target nucleic acids are NOT DETECTED.  The SARS-CoV-2 RNA is generally detectable in upper respiratory specimens during the acute phase of infection. The lowest concentration of SARS-CoV-2 viral copies this assay can detect is 138 copies/mL. A negative result does not preclude SARS-Cov-2 infection and should not be used as the sole basis for treatment or other patient management decisions. A negative result may occur with  improper specimen collection/handling, submission of specimen other than nasopharyngeal swab, presence of viral mutation(s) within the areas targeted by this assay, and inadequate number of viral copies(<138 copies/mL). A negative result must be combined with clinical observations, patient history, and epidemiological information. The expected result is Negative.  Fact Sheet for Patients:  EntrepreneurPulse.com.au  Fact Sheet for Healthcare Providers:  IncredibleEmployment.be  This test is no t yet approved or cleared by the [place] FDA and  has been authorized for detection and/or diagnosis of SARS-CoV-2 by FDA under an Emergency Use Authorization (EUA). This EUA will remain  in effect (meaning this test can be used) for the duration of the COVID-19 declaration under Section 564(b)(1) of the Act, 21 U.S.C.section 360bbb-3(b)(1), unless the authorization is terminated  or revoked sooner.       Influenza A by PCR NEGATIVE NEGATIVE Final   Influenza B by PCR NEGATIVE NEGATIVE Final    Comment: (NOTE) The Xpert Xpress SARS-CoV-2/FLU/RSV plus assay is intended as an aid in the diagnosis of influenza from Nasopharyngeal swab specimens and should not be used as a sole basis for treatment. Nasal washings and aspirates are unacceptable for Xpert Xpress  SARS-CoV-2/FLU/RSV testing.  Fact Sheet for Patients: EntrepreneurPulse.com.au  Fact Sheet for Healthcare Providers: IncredibleEmployment.be  This test is not yet approved or cleared by the [place] FDA and has been authorized for detection and/or diagnosis of SARS-CoV-2 by FDA under an Emergency Use Authorization (EUA). This EUA will remain in effect (meaning this test can be used) for the duration of the COVID-19 declaration under Section 564(b)(1) of the Act, 21 U.S.C. section 360bbb-3(b)(1), unless the authorization is terminated or revoked.  Performed at [facility], [address], [place], [place] [address]   Urine Culture     Status: None   Collection Time: 12/17/22  3:58 PM   Specimen: In/Out Cath Urine  Result Value Ref Range Status   Specimen Description   Final    IN/OUT CATH URINE Performed at [facility], [address], [place], [place] [address]    Special Requests   Final    NONE Performed at [facility], [address] [place], [place] [address]    Culture   Final    NO GROWTH Performed at [facility], [place] [address] [place], [place] [address]  Report Status 12/18/2022 FINAL  Final  MRSA Next Gen by PCR, Nasal     Status: None   Collection Time: 12/17/22  9:02 PM   Specimen: Nasal Mucosa; Nasal Swab  Result Value Ref Range Status   MRSA by PCR Next Gen NOT DETECTED NOT DETECTED Final    Comment: (NOTE) The GeneXpert MRSA Assay (FDA approved for NASAL specimens only), is one component of a comprehensive MRSA colonization surveillance program. It is not intended to diagnose MRSA infection nor to guide or monitor treatment for MRSA infections. Test performance is not FDA approved in patients less than 25 years old. Performed at [facility], [address] [place], [place] [address]   Culture, Respiratory w Gram Stain     Status: None   Collection Time:  12/18/22 11:37 AM   Specimen: Tracheal Aspirate; Respiratory  Result Value Ref Range Status   Specimen Description   Final    TRACHEAL ASPIRATE Performed at [facility], [address], [place], [place] [address]    Special Requests   Final    NONE Performed at [facility], [address] [place], [place] [address]    Gram Stain   Final    RARE WBC PRESENT, PREDOMINANTLY PMN RARE BUDDING YEAST SEEN    Culture   Final    FEW Normal respiratory flora-no Staph aureus or Pseudomonas seen Performed at [facility] [address] [place], [place] [address]    Report Status 12/20/2022 FINAL  Final  Culture, blood (Routine X 2) w Reflex to ID Panel     Status: None (Preliminary result)   Collection Time: 12/24/22  4:58 PM   Specimen: BLOOD  Result Value Ref Range Status   Specimen Description BLOOD BLOOD LEFT HAND  Final   Special Requests   Final    BOTTLES DRAWN AEROBIC AND ANAEROBIC Blood Culture adequate volume   Culture   Final    NO GROWTH 3 DAYS Performed at [facility], [address] [place], [place] [address]    Report Status PENDING  Incomplete  Culture, blood (Routine X 2) w Reflex to ID Panel     Status: None (Preliminary result)   Collection Time: 12/24/22  5:04 PM   Specimen: BLOOD  Result Value Ref Range Status   Specimen Description BLOOD BLOOD RIGHT HAND  Final   Special Requests   Final    BOTTLES DRAWN AEROBIC AND ANAEROBIC Blood Culture adequate volume   Culture   Final    NO GROWTH 3 DAYS Performed at [facility], [address] [place], [place] [address]    Report Status PENDING  Incomplete    Procedures and diagnostic studies:  No results found.             LOS: 10 days   Rosalinda Seaman  [facility]   Pager on [email or web address]. If 7PM-7AM, please contact night-coverage at [email or web address]     12/27/2022, 12:59 PM

## 2022-12-27 NOTE — TOC Initial Note (Addendum)
Transition of Care [facility]) - Initial/Assessment Note    Patient Details  Name: Jill Smith MRN: [id number] Date of Birth: 02/27/1951  Transition of Care [facility]) CM/SW Contact:    Jill Pilgrim, LCSW Phone Number: 12/27/2022, 10:40 AM  Clinical Narrative:    SW spoke with pt's sister Jill Smith who states that pt was in with [facility] prior to admission. Jill Smith states she would really like for the patient to return home with [facility]. She states that she feels she can manage her at home with [facility] services. SW explained pt had not yet ambulated with PT. Jill Smith states she did not want to discuss SNF further until she saw a PT session herself. PT/OT notified that famly will be here at 42, if able to see while family is present. Jill Smith states at baseline pt was walking with a rollator but reports it is now broken and she needs a new one. SW will reach out to DME company to see if it can be replaced if pt does discharge home.            3:54pm SW spoke with sister who states she was present for the PT eval. Sister insistent she can manage patient at home. Sister reports that she would like to discharge home with [place] services. Sister states she had [employer] services but they stopped coming out so she is going to call her Training and development officer. SW will follow up with DME recommendations made by PT.   4:08PM Message sent to [employer], Jill Smith,  to see if they can get the pt a new rollator as her sister states hers has broken. She also was wanting to get a new mattress for the hospital bed but was advised she can only get one every 3 years.   4:22pm Per Jill Smith at [employer], pt can get a new Rollator and a new hospital bed mattress. Jill Smith from [employer] to advise on how to coordinate.   Expected Discharge Plan: Skilled Nursing Facility Barriers to Discharge: Continued Medical Work up   Patient Goals and CMS Choice Patient states their goals for this hospitalization and ongoing recovery are:: return home CMS  Medicare.gov Compare Post Acute Care list provided to:: Patient Represenative (must comment) Jill Smith (Sister) [phone]) Choice offered to / list presented to : [facility] POA / Guardian      Expected Discharge Plan and Services   Discharge Planning Services: CM Consult   Living arrangements for the past 2 months: Single Family Home                           HH Arranged: OT, PT, Nurse's Aide [place] Agency: [facility] Date [facility] Agency Contacted: 12/20/22 Time [place]: 0630 Representative spoke with at [place]: [place]  Prior Living Arrangements/Services Living arrangements for the past 2 months: [place] with:: Self Patient language and need for interpreter reviewed:: Yes Do you feel safe going back to the place where you live?: Yes      Need for Family Participation in Patient Care: Yes (Comment) Care giver support system in place?: Yes (comment) Current home services: DME, Home OT, Home PT Criminal Activity/Legal Involvement Pertinent to Current Situation/Hospitalization: No - Comment as needed  Activities of Daily Living      Permission Sought/Granted Permission sought to share information with : Family Supports, Customer service manager    Share Information with NAME: Jill Smith (Sister) [phone]  Permission granted to share info  w AGENCY: [facility]        Emotional Assessment         Alcohol / Substance Use: Not Applicable Psych Involvement: No (comment)  Admission diagnosis:  Hypothermia [T68.XXXA] Hypothermia, initial encounter [T68.XXXA] Acute pancreatitis, unspecified complication status, unspecified pancreatitis type [K85.90] Aspiration pneumonia, unspecified aspiration pneumonia type, unspecified laterality, unspecified part of lung ([place]) [J69.0] Sepsis, due to unspecified organism, unspecified whether acute organ dysfunction present [facility]) [A41.9] Patient Active Problem List   Diagnosis Date  Noted   Hypernatremia 12/22/2022   Cardiac arrest ([place]) 12/22/2022   Epistaxis 12/22/2022   SIRS (systemic inflammatory response syndrome) ([place]) 12/19/2022   AKI (acute kidney injury) ([place]) 12/19/2022   AV block, [place] 2 12/19/2022   Non-traumatic compartment syndrome of abdomen 12/19/2022   NSVT (nonsustained ventricular tachycardia) ([place]) 12/19/2022   Sepsis ([place]) 12/18/2022   Aspiration pneumonia ([place]) 12/18/2022   Pressure injury of skin 12/18/2022   Hypothermia 12/17/2022   Acute pancreatitis 12/17/2022   Calculus of gallbladder with acute cholecystitis without obstruction 12/17/2022   Back pain 02/09/2019   PCP:  [facility], [place]:   [address], [place] [place] [address] Phone: [phone] Fax: [phone]     Social Determinants of Health (SDOH) Social History: SDOH Screenings   Tobacco Use: Medium Risk (12/19/2022)   SDOH Interventions:     Readmission Risk Interventions    12/27/2022   10:37 AM  Readmission Risk Prevention Plan  Transportation Screening Complete  Home Care Screening Complete  Medication Review (RN CM) Complete

## 2022-12-27 NOTE — Care Management Important Message (Signed)
Important Message  Patient Details  Name: Jill Smith MRN: [id number] Date of Birth: 10-26-1951   Medicare Important Message Given:  Yes  Reviewed Medicare IM with [name], sister, at [phone].  Sister arrived to patient's room, was able to get initial Medicare IM signed.  Original to be scanned into patient's chart, copy left with sister for reference.    [name] 12/27/2022, 12:01 PM

## 2022-12-27 NOTE — Progress Notes (Signed)
Occupational Therapy Treatment Patient Details Name: Jill Smith MRN: [id number] DOB: [birth year] Today's Date: 12/27/2022   History of present illness 71 y/o female presented to ED on 12/17/22 for AMS and hypothermia. Code blue on 12/19. Intubated 12/18-12/21. Found to have toxic metabolic encephalopathy in setting of acute pancreatitis and hypothermia. PMH: bipolar disorder, HTN, schizophrenia, depression   OT comments  Chart reviewed, RN cleared pt for participation in OT tx session. Co tx completed with PT on this date. Sister present throughout. Tx session targeted improving functional activity tolerance in preparation for ADL tasks. Max A +2-3 required for bed mobility, slight improvements in STS with RW requiring MAX A +2, two attempts for linen change after pt is incontinent of urine, able to maintain approx 3/4 full standing with MAX A +2. Lateral lean noted with poor sitting balance with L sided weakness also noted. Pt is performing ADL/functional mobility below PLOF, continue to recommend STR to address deficits. OT will continue to follow acutely.    Recommendations for follow up therapy are one component of a multi-disciplinary discharge planning process, led by the attending physician.  Recommendations may be updated based on patient status, additional functional criteria and insurance authorization.    Follow Up Recommendations  Skilled nursing-short term rehab (<3 hours/day)     Assistance Recommended at Discharge Frequent or constant Supervision/Assistance  Patient can return home with the following  A lot of help with walking and/or transfers;A lot of help with bathing/dressing/bathroom;Help with stairs or ramp for entrance   Equipment Recommendations  Other (comment) (per next venue of care)    Recommendations for Other Services      Precautions / Restrictions Precautions Precautions: Fall Restrictions Weight Bearing Restrictions: No       Mobility Bed  Mobility Overal bed mobility: Needs Assistance Bed Mobility: Supine to Sit, Sit to Supine     Supine to sit: Max assist, +2 for physical assistance, +2 for safety/equipment Sit to supine: Max assist, +2 for physical assistance, +2 for safety/equipment        Transfers Overall transfer level: Needs assistance Equipment used: Rolling walker (2 wheels) Transfers: Sit to/from Stand Sit to Stand: Max assist, +2 safety/equipment, +2 physical assistance           General transfer comment: step by step vcs for sequencing, technique; approx to 3/4 full standing     Balance Overall balance assessment: Needs assistance Sitting-balance support: No upper extremity supported, Feet supported Sitting balance-Leahy Scale: Poor   Postural control: Right lateral lean, Left lateral lean Standing balance support: Bilateral upper extremity supported, Reliant on assistive device for balance Standing balance-Leahy Scale: Zero                             ADL either performed or assessed with clinical judgement   ADL Overall ADL's : Needs assistance/impaired                                       General ADL Comments: TOTAL A to donn B socks, MAX A for UB dressing, MOD A for drinking, assit to maintain aspiration preacutions, TOTAL A +2 for toileting, step by step vcs throughout    Extremity/Trunk Assessment              Vision       Perception     Praxis  Cognition Arousal/Alertness: Awake/alert Behavior During Therapy: Flat affect Overall Cognitive Status: Impaired/Different from baseline Area of Impairment: Orientation, Safety/judgement, Following commands, Awareness, Memory, Problem solving, Attention                 Orientation Level: Disoriented to, Time, Situation, Place Current Attention Level: Focused Memory: Decreased recall of precautions, Decreased short-term memory Following Commands: Follows one step commands inconsistently,  Follows one step commands with increased time Safety/Judgement: Decreased awareness of safety, Decreased awareness of deficits Awareness: Intellectual Problem Solving: Slow processing, Difficulty sequencing, Requires verbal cues, Requires tactile cues          Exercises Other Exercises Other Exercises: edu pt and sister re: discharge recommendations    Shoulder Instructions       General Comments vital signs monitored, appear stable throughout    Pertinent Vitals/ Pain       Pain Assessment Pain Assessment: Faces Faces Pain Scale: Hurts little more Pain Location: R foot Pain Descriptors / Indicators: Discomfort Pain Intervention(s): Limited activity within patient's tolerance, Monitored during session, Repositioned  Home Living                                          Prior Functioning/Environment              Frequency  Min 2X/week        Progress Toward Goals  OT Goals(current goals can now be found in the care plan section)  Progress towards OT goals: Progressing toward goals     Plan Discharge plan remains appropriate    Co-evaluation    PT/OT/SLP Co-Evaluation/Treatment: Yes Reason for Co-Treatment: For patient/therapist safety;Necessary to address cognition/behavior during functional activity;To address functional/ADL transfers   OT goals addressed during session: ADL's and self-care      AM-PAC OT "6 Clicks" Daily Activity     Outcome Measure   Help from another person eating meals?: A Little Help from another person taking care of personal grooming?: A Lot Help from another person toileting, which includes using toliet, bedpan, or urinal?: A Lot Help from another person bathing (including washing, rinsing, drying)?: A Lot Help from another person to put on and taking off regular upper body clothing?: A Lot Help from another person to put on and taking off regular lower body clothing?: A Lot 6 Click Score: 13    End of  Session Equipment Utilized During Treatment: Rolling walker (2 wheels)  OT Visit Diagnosis: Muscle weakness (generalized) (M62.81);Unsteadiness on feet (R26.81)   Activity Tolerance Patient tolerated treatment well   Patient Left in bed;with call bell/phone within reach;with nursing/sitter in room;with bed alarm set   Nurse Communication Mobility status        Time: 4259-5638 OT Time Calculation (min): 25 min  Charges: OT General Charges $OT Visit: 1 Visit OT Treatments $Self Care/Home Management : 8-22 mins  Shanon Payor, OTD OTR/L  12/27/22, 1:12 PM

## 2022-12-27 NOTE — Progress Notes (Signed)
Physical Therapy Treatment Patient Details Name: Jill Smith MRN: [id number] DOB: 10/18/1951 Today's Date: 12/27/2022   History of Present Illness 71 y/o female presented to ED on 12/17/22 for AMS and hypothermia. Code blue on 12/19. Intubated 12/18-12/21. Found to have toxic metabolic encephalopathy in setting of acute pancreatitis and hypothermia. PMH: bipolar disorder, HTN, schizophrenia, depression    PT Comments    Patient seen for PT/OT co tx to maximize activity tolerance. Sister present during session to determine SNF vs home with HHPT/OT and family support. Patient required maxA+2 for bed mobility and maxA+2 to stand from EOB x 2 for linen change due to urinary incontinence. Able to achieve 3/4 upright standing but required maxA+2 to maintain standing. Poor sitting balance throughout despite cues and maxA. Educated sister on current assistance required and benefits of rehab, sister in agreement. Continue to recommend SNF for ongoing Physical Therapy.       Recommendations for follow up therapy are one component of a multi-disciplinary discharge planning process, led by the attending physician.  Recommendations may be updated based on patient status, additional functional criteria and insurance authorization.  Follow Up Recommendations  Skilled nursing-short term rehab (<3 hours/day) Can patient physically be transported by private vehicle: No   Assistance Recommended at Discharge Frequent or constant Supervision/Assistance  Patient can return home with the following Two people to help with walking and/or transfers;Two people to help with bathing/dressing/bathroom;Assistance with cooking/housework;Assist for transportation;Help with stairs or ramp for entrance;Direct supervision/assist for medications management;Direct supervision/assist for financial management;Assistance with feeding   Equipment Recommendations  Other (comment) (defer to next venue of care)    Recommendations  for Other Services       Precautions / Restrictions Precautions Precautions: Fall Restrictions Weight Bearing Restrictions: No     Mobility  Bed Mobility Overal bed mobility: Needs Assistance Bed Mobility: Supine to Sit, Sit to Supine     Supine to sit: Max assist, +2 for physical assistance, +2 for safety/equipment Sit to supine: Max assist, +2 for physical assistance, +2 for safety/equipment        Transfers Overall transfer level: Needs assistance Equipment used: Rolling Selda Jalbert (2 wheels) Transfers: Sit to/from Stand Sit to Stand: Max assist, +2 safety/equipment, +2 physical assistance           General transfer comment: step by step vcs for sequencing, technique; approx to 3/4 full standing    Ambulation/Gait                   Stairs             Wheelchair Mobility    Modified Rankin (Stroke Patients Only)       Balance Overall balance assessment: Needs assistance Sitting-balance support: No upper extremity supported, Feet supported Sitting balance-Leahy Scale: Poor   Postural control: Left lateral lean, Right lateral lean Standing balance support: Bilateral upper extremity supported, Reliant on assistive device for balance Standing balance-Leahy Scale: Zero                              Cognition Arousal/Alertness: Awake/alert Behavior During Therapy: Flat affect Overall Cognitive Status: Impaired/Different from baseline Area of Impairment: Orientation, Safety/judgement, Following commands, Awareness, Memory, Problem solving, Attention                 Orientation Level: Disoriented to, Time, Situation, Place Current Attention Level: Focused Memory: Decreased recall of precautions, Decreased short-term memory Following Commands: Follows one step  commands inconsistently, Follows one step commands with increased time Safety/Judgement: Decreased awareness of safety, Decreased awareness of deficits Awareness:  Intellectual Problem Solving: Slow processing, Difficulty sequencing, Requires verbal cues, Requires tactile cues          Exercises Other Exercises Other Exercises: edu pt and sister re: discharge recommendations    General Comments General comments (skin integrity, edema, etc.): vital signs monitored, appear stable throughout      Pertinent Vitals/Pain Pain Assessment Pain Assessment: Faces Faces Pain Scale: Hurts little more Pain Location: R foot Pain Descriptors / Indicators: Discomfort Pain Intervention(s): Monitored during session    Home Living                          Prior Function            PT Goals (current goals can now be found in the care plan section) Acute Rehab PT Goals PT Goal Formulation: Patient unable to participate in goal setting Time For Goal Achievement: 01/08/23 Potential to Achieve Goals: Fair Progress towards PT goals: Progressing toward goals    Frequency    Min 2X/week      PT Plan Current plan remains appropriate    Co-evaluation PT/OT/SLP Co-Evaluation/Treatment: Yes Reason for Co-Treatment: For patient/therapist safety;To address functional/ADL transfers;Necessary to address cognition/behavior during functional activity PT goals addressed during session: Mobility/safety with mobility;Balance OT goals addressed during session: ADL's and self-care      AM-PAC PT "6 Clicks" Mobility   Outcome Measure  Help needed turning from your back to your side while in a flat bed without using bedrails?: Total Help needed moving from lying on your back to sitting on the side of a flat bed without using bedrails?: Total Help needed moving to and from a bed to a chair (including a wheelchair)?: Total Help needed standing up from a chair using your arms (e.g., wheelchair or bedside chair)?: Total Help needed to walk in hospital room?: Total Help needed climbing 3-5 steps with a railing? : Total 6 Click Score: 6    End of  Session   Activity Tolerance: Patient tolerated treatment well Patient left: in bed;with call bell/phone within reach;with bed alarm set;with family/visitor present Nurse Communication: Mobility status PT Visit Diagnosis: Unsteadiness on feet (R26.81);Muscle weakness (generalized) (M62.81);Other abnormalities of gait and mobility (R26.89);Difficulty in walking, not elsewhere classified (R26.2)     Time: 8502-7741 PT Time Calculation (min) (ACUTE ONLY): 23 min  Charges:  $Therapeutic Activity: 8-22 mins                     Carrianne Hyun A. Gilford Rile PT, DPT [facility]    Kaushik Maul A Braydan Marriott 12/27/2022, 1:20 PM

## 2022-12-28 DIAGNOSIS — E870 Hyperosmolality and hypernatremia: Secondary | ICD-10-CM | POA: Diagnosis not present

## 2022-12-28 DIAGNOSIS — K8511 Biliary acute pancreatitis with uninfected necrosis: Secondary | ICD-10-CM | POA: Diagnosis not present

## 2022-12-28 DIAGNOSIS — J690 Pneumonitis due to inhalation of food and vomit: Secondary | ICD-10-CM | POA: Diagnosis not present

## 2022-12-28 LAB — GLUCOSE, CAPILLARY
Glucose-Capillary: 103 mg/dL — ABNORMAL HIGH (ref 70–99)
Glucose-Capillary: 131 mg/dL — ABNORMAL HIGH (ref 70–99)
Glucose-Capillary: 113 mg/dL — ABNORMAL HIGH (ref 70–99)
Glucose-Capillary: 137 mg/dL — ABNORMAL HIGH (ref 70–99)

## 2022-12-28 NOTE — Progress Notes (Addendum)
    Durable Medical Equipment  (From admission, onward)           Start     Ordered   12/28/22 0000  For home use only DME 4 wheeled rolling walker with seat       Question:  Patient needs a walker to treat with the following condition  Answer:  General weakness   12/28/22 1053   12/28/22 0000  For home use only DME Other see comment       Comments: mattress  Question:  Length of Need  Answer:  Lifetime   12/28/22 1053   12/28/22 0000  For home use only DME Other see comment       Comments: Bariatric wheelchair  Question:  Length of Need  Answer:  Lifetime   12/28/22 1220   12/28/22 0000  For home use only DME 3 n 1        12/28/22 1301           Patient is not able to walk the distance required to go the bathroom, or he/she is unable to safely negotiate stairs required to access the bathroom.  A 3in1 BSC will alleviate this problem  Patient needs bariatric  equipment due to body habitus.   Kelby Fam, [place], MSW, [place]

## 2022-12-28 NOTE — Progress Notes (Signed)
Speech Language Pathology Treatment: Dysphagia  Patient Details Name: Jill Smith MRN: [id number] DOB: [birth year] Today's Date: 12/28/2022 Time: 4098-1191 SLP Time Calculation (min) (ACUTE ONLY): 45 min  Assessment / Plan / Recommendation Clinical Impression  Pt seen for for ongoing assessment of swallowing today; trials to upgrade diet to thin liquids if possible. Pt awake, verbally responsive to few basic questions though tangential, mumbled response - did not immediately respond. Suspect degree of Cognitive change/decline-- see Head CT results indicating Atrophy and pt's baseline Psychiatric dis. She does not wear her dentures. Pt's request this session upon telling her about the session's goals today: "gimme some water". On RA; afebrile. WBC wnl.   Pt appears min improved in her swallowing abilities but continues to present w/ oropharyngeal phase dysphagia and risk for aspiration in setting of Psychiatric illness/dxs, acute illness, and suspected, declined Cognitive status. ANY Cognitive decline can impact overall awareness/timing of swallow and safety during po tasks which increases risk for aspiration, choking. Pt's risk for aspiration can be reduced when following general aspiration precautions, given feeding support and Supervision for Impulsive drinking and eating behaviors, and using a modified diet consistency of Pureed foods(does not wear Dentures at home per family).   She also requires verbal/visual cues for follow through during po tasks and feeding support, Supervision.        Pt consumed several trials of ice chips, thin liquids via Cup then Pinched straw w/ No immediate, overt clinical s/s of aspiration noted; no coughing, no decline in vocal quality, no decline in respiratory status during/post trials occurred. O2 sats remained 98%. Same was noted w/ puree trials. Oral phase was grossly adequate for bolus management, A-P transit, and oral clearing of the boluses given.  Verbal/tactile cues utilized during session. Pt appears to have decreased insight into safety awareness and often impulsively drank when not supervised by this SLP. Also, suspect a mildly delayed pharyngeal swallow.        In setting of acute illness, Baseline Psychiatric illness/dxs w/ Cognitive decline, Edentulous status, dependency feeding, and her risk for aspiration, recommend continue a dysphagia level 1(PUREED foods moistened for ease of oral phase and flavor) w/ thin liquids; aspiration precautions; reduce Distractions during meals and engage pt during meals for self-feeding. Pills Crushed in Puree for safer swallowing as needed. Support w/ feeding at meals to reduce Impulsive drinking behaviors and check for oral clearing as needed.  Barriers/Prognosis Comment: baseline psychiatric illness/dxs; see Head CT results re: Atrophy. MD/NSG updated.  ST services recommends follow w/ Palliative Care for [place] moving forward and education re: impact of any Cognitive decline/illness on swallowing. Precautions posted in room, chart. ST services will f/u w/ trials to upgrade food consistency in diet; toleration of diet.      HPI HPI: Pt is a 71 y.o. female with medical history significant for Bipolar disorder, Schizophrenia, depression, hypertension, Obesity, tubular adenoma of colon, who presented to the emergency department on 12/17/2022 with hypothermia and altered mental status.  According to EMS, she was non-verbal and that is not her baseline.  Reportedly, the heater in her home had stopped working on the night of 12/16/2022, and she was found the following morning with altered mental status.  She was admitted to the hospital for severe hypothermia and acute pancreatitis with peripancreatic necrosis.  General surgeon and gastroenterologist were consulted, and they recommended conservative management.  She was treated with empiric IV antibiotics and IV fluids.  She developed asystole cardiac arrest on  12/18/2022,  around  2 AM in the morning.  She was intubated and placed on mechanical ventilation.  In the ICU, she required low-dose vasopressors.  She was intubated x3 days.   CXR: No active disease.  Head CT: No CT evidence for acute intracranial abnormality. Atrophy and  chronic small vessel ischemic changes of the white matter.      SLP Plan  Continue with current plan of care      Recommendations for follow up therapy are one component of a multi-disciplinary discharge planning process, led by the attending physician.  Recommendations may be updated based on patient status, additional functional criteria and insurance authorization.    Recommendations  Diet recommendations: Dysphagia 1 (puree);Thin liquid Liquids provided via: Cup;Straw (monitor closely d/t impulsive drinking behaviors) Medication Administration: Whole meds with puree Supervision: Staff to assist with self feeding;Full supervision/cueing for compensatory strategies Compensations: Minimize environmental distractions;Slow rate;Small sips/bites;Lingual sweep for clearance of pocketing;Follow solids with liquid Postural Changes and/or Swallow Maneuvers: Out of bed for meals;Seated upright 90 degrees;Upright 30-60 min after meal                General recommendations:  (Palliative Care consult for [place] moving forward) Oral Care Recommendations: Oral care BID;Oral care before and after PO;Staff/trained caregiver to provide oral care Follow Up Recommendations: Skilled nursing-short term rehab (<3 hours/day) Assistance recommended at discharge: Frequent or constant Supervision/Assistance SLP Visit Diagnosis: Dysphagia, oropharyngeal phase (R13.12) (baseline psychiatric illness/dxs) Plan: Continue with current plan of care             Orinda Kenner, [place], [place]; [place] [phone] (ascom) Jill Smith  12/28/2022, 5:51 PM

## 2022-12-28 NOTE — Progress Notes (Signed)
Progress Note    Jill Smith  INO:[id number] DOB: November 15, 1951  DOA: 12/17/2022 PCP: [facility] [place]      Brief Narrative:    Medical records reviewed and are as summarized below:  Jill Smith is a 71 y.o. female with medical history significant for bipolar disorder, schizophrenia, depression, hypertension, obesity, tubular adenoma of colon, who presented to the emergency department on 12/17/2022 with hypothermia and altered mental status.  According to EMS, she was non-verbal and that is not her baseline.  Reportedly, the heater in her home had stopped working on the night of 12/16/2022, and she was found the following morning with altered mental status.  ED course In the emergency room, rectal temperature was 31.8 F. BP 154/87/hr 36/ O2 sats 87% on RA.  Bear hugger applied and pt received 1L of warm saline due to severe hypothermia.  Lab results were: K+ 5.2/calcium 10.4/AST 73/ALT 129/total protein 6.3/wbc 3.2/platelet count 92.  COVID-19/Influenza A&B by PCR negative. EKG with sinus bradycardia. CT head with no acute abnormality (chronic findings noted). CT abdomen/pelvis showed findings of acute pancreatitis with early peripancreatic necrosis. There was also cholelithiasis (8 mm CBD) and a large gallstone in the neck of the gallbladder as well. Small volume ascites and bilateral pleural effusions were noted. There was also adrenal hyperplasia.   She was admitted to the hospital for acute pancreatitis with peripancreatic necrosis.  General surgeon and gastroenterologist were consulted, and they recommended conservative management.  She was treated with empiric IV antibiotics and IV fluids.  She developed asystole cardiac arrest on 12/18/2022,  around 2 AM in the morning.  She was intubated and placed on mechanical ventilation.  In the ICU, she required low-dose vasopressors.   Significant Hospital Events: Including procedures, antibiotic start and stop dates  in addition to other pertinent events   12/18: Pt admitted to ICU with acute metabolic encephalopathy, severe hypothermia (reported heater was nonfunctional at home) and sepsis secondary to pneumonia and acute pancreatitis  12/18: Overnight during rewarming pt became bradycardic and subsequently asystole requiring initiation of ACLS protocol and mechanical intubation prior to [place] 12/19: Pt had another episode of bradycardia followed by transient hypotension requiring continuous levophed gtt. Rewarmed. 12/20: increased intraabdominal pressures (measured at 40 mmHg). Continue rewarming. 12/21: intraabdominal pressure at 19 mmHg this morning. Awake and follows commands - SBT  She was transferred to [facility] hospitalist service on 12/21/2022.   Assessment/Plan:   Principal Problem:   Hypothermia Active Problems:   Acute pancreatitis   Cardiac arrest (HCC)   Calculus of gallbladder with acute cholecystitis without obstruction   Sepsis ([place])   Aspiration pneumonia (HCC)   Pressure injury of skin   SIRS (systemic inflammatory response syndrome) (HCC)   AKI (acute kidney injury) (HCC)   AV block, Mobitz 2   Non-traumatic compartment syndrome of abdomen   NSVT (nonsustained ventricular tachycardia) (HCC)   Hypernatremia   Epistaxis   Nutrition Problem: Inadequate oral intake   Signs/Symptoms: NPO status   Body mass index is 38.16 kg/m.  (Obesity)   Acute pancreatitis with early peripancreatic necrosis, cholelithiasis, suspected aspiration pneumonia: She is tolerating dysphagia 1 diet. Outpatient follow-up with general surgeon (Dr. Christian Mate) for further management of cholelithiasis.  Jill Smith, sister, is aware of this recommendation.   Fever, suspected aspiration pneumonia given recent epistaxis: Plan to complete Augmentin today (5 days of Zosyn and Augmentin in total)  S/p cardiac arrest (asystole) on 12/19, NSVT Mobitz type II second-degree AV block,  chronic diastolic CHF: Reengaged  cardiologist because of concern for A-fib with RVR on telemetry and recent heart block.  This was reviewed by Dr. Garen Lah, cardiologist on 12/25.  No A-fib. 2D echo showed EF estimated at 60 to 67%, grade 3 diastolic dysfunction (restrictive), mild MR, mild to moderate TR   Acute hypoxic respiratory failure: Resolved.  She has been weaned off of oxygen.  S/p extubation on 12/20/2022.     Recurrent epistaxis: S/p nose packing on 12/22/2022.  She was evaluated by Dr. Richardson Landry, ENT physician.  Unfortunately,nose packs were dislodged after patient pulled on the packing on 12/25/2022.  She had a trickle of nosebleed after nose packs were removed.  Bleeding stopped spontaneously according to [place], Therapist, sports.  She informed Dr. Tami Ribas, ENT, recommended Afrin nasal spray and applying pressure as needed. Previously completed treatment with Afrin nasal spray on 12/23/2022 Of note, patient had a very small amount of nosebleed today (12/28/2022) according to [place], RN but this resolved spontaneously without any intervention.   Hypertension: Continue antihypertensives.  Monitor BP closely and adjust antihypertensives as needed.   Use IV hydralazine as needed for severe hypertension.   S/p distributive shock: She is off of vasopressors and IV hydrocortisone.      Hypernatremia, poor oral intake, s/p hypoglycemia on 12/20/2022: Improved.     Acute toxic metabolic encephalopathy: Slowly improving.  Continue supportive care.  EEG showed moderate to severe diffuse slowing indicating global cerebral dysfunction.  No epileptiform abnormalities were seen.   AKI, hypophosphatemia, thrombocytopenia: Improved   Right IJ central line was removed on 12/26/2022    Jill Smith, sister, plans to take patient home tomorrow.  DME orders for mattress, 3 and 1 commode and rolling walker were placed today in preparation for discharge tomorrow.     Diet Order             DIET - DYS 1 Room service appropriate? Yes with  Assist; Fluid consistency: Thin  Diet effective now                            Consultants: [place] surgery Gastroenterologist  Procedures: Intubation and mechanical ventilation on 12/18/2022 Right IJ central line on 12/17/2022    Medications:    amLODipine  10 mg Oral Daily   amoxicillin-clavulanate  1 tablet Oral Q12H   vitamin C  500 mg Oral BID   feeding supplement (NEPRO CARB STEADY)  237 mL Oral TID BM   heparin injection (subcutaneous)  5,000 Units Subcutaneous Q12H   isosorbide mononitrate  60 mg Oral Daily   multivitamin with minerals  1 tablet Oral Daily   nystatin   Topical TID   mouth rinse  15 mL Mouth Rinse 4 times per day   pantoprazole  40 mg Oral Daily   Continuous Infusions:  sodium chloride Stopped (12/26/22 1210)     Anti-infectives (From admission, onward)    Start     Dose/Rate Route Frequency Ordered Stop   12/26/22 2000  amoxicillin-clavulanate (AUGMENTIN) 875-125 MG per tablet 1 tablet        1 tablet Oral Every 12 hours 12/26/22 1146 12/29/22 0759   12/24/22 1730  piperacillin-tazobactam (ZOSYN) IVPB 3.375 g  Status:  Discontinued        3.375 g 12.5 mL/hr over 240 Minutes Intravenous Every 8 hours 12/24/22 1635 12/26/22 1146   12/23/22 2200  piperacillin-tazobactam (ZOSYN) IVPB 3.375 g  Status:  Discontinued  3.375 g 12.5 mL/hr over 240 Minutes Intravenous Every 8 hours 12/23/22 0646 12/23/22 1132   12/22/22 2200  piperacillin-tazobactam (ZOSYN) IVPB 4.5 g  Status:  Discontinued        4.5 g 200 mL/hr over 30 Minutes Intravenous Every 8 hours 12/22/22 2105 12/23/22 1132   12/17/22 1600  piperacillin-tazobactam (ZOSYN) IVPB 3.375 g  Status:  Discontinued        3.375 g 12.5 mL/hr over 240 Minutes Intravenous Every 8 hours 12/17/22 1554 12/20/22 1019              Family Communication/Anticipated D/C date and plan/Code Status   DVT prophylaxis: Place and maintain sequential compression device Start:  12/25/22 1024 heparin injection 5,000 Units Start: 12/18/22 2200 SCDs Start: 12/17/22 1520     Code Status: Full Code  Family Communication: Jill Smith, sister Disposition Plan: Plan to discharge to SNF   Status is: Inpatient Remains inpatient appropriate because: Will need SNF at discharge       Subjective:   Interval events noted.  Patient is more awake and communicative today. Elisa, RN, was at the bedside  Objective:    Vitals:   12/28/22 0605 12/28/22 0750 12/28/22 1255 12/28/22 1609  BP: (!) 140/71 134/72 (!) 102/57 (!) 140/76  Pulse: 87 85 (!) 102 83  Resp: '18 18 18   '$ Temp: 98.6 F (37 C) 99.9 F (37.7 C) 97.7 F (36.5 C) 97.8 F (36.6 C)  TempSrc:  Axillary    SpO2: 98% 96% 99% 99%  Weight:      Height:       No data found.   Intake/Output Summary (Last 24 hours) at 12/28/2022 1657 Last data filed at 12/28/2022 0753 Gross per 24 hour  Intake --  Output 1000 ml  Net -1000 ml   Filed Weights   12/23/22 0500 12/24/22 0406 12/26/22 0449  Weight: 120.8 kg 122.4 kg 117.2 kg    Exam:  GEN: NAD SKIN: Warm and dry EYES: No pallor or icterus ENT: MMM CV: RRR PULM: CTA B ABD: soft, obese, NT, +BS CNS: AAO x 2 (person and place) EXT: No edema or tenderness      Pressure Injury 12/17/22 Foot Left Stage 2 -  Partial thickness loss of dermis presenting as a shallow open injury with a red, pink wound bed without slough. (Active)  12/17/22   Location: Foot  Location Orientation: Left  Staging: Stage 2 -  Partial thickness loss of dermis presenting as a shallow open injury with a red, pink wound bed without slough.  Wound Description (Comments):   Present on Admission:   Dressing Type None 12/28/22 0845     Data Reviewed:   I have personally reviewed following labs and imaging studies:  Labs: Labs show the following:   Basic Metabolic Panel: Recent Labs  Lab 12/22/22 0459 12/23/22 0114 12/24/22 0402 12/25/22 0500 12/27/22 0519  NA  149* 146* 147* 145 145  K 4.3 4.5 4.0 4.0 4.0  CL 115* 113* 115* 112* 115*  CO2 '30 27 29 27 24  '$ GLUCOSE 90 123* 122* 114* 85  BUN 40* 38* 31* 32* 40*  CREATININE 1.47* 1.32* 1.29* 1.24* 1.28*  CALCIUM 9.7 9.8 9.8 10.0 10.1  MG 2.7* 2.5* 2.3 2.3  --   PHOS 3.6 3.5 2.0* 3.5  --    GFR Estimated Creatinine Clearance: 55.1 mL/min (A) (by C-G formula based on SCr of 1.28 mg/dL (H)). Liver Function Tests: Recent Labs  Lab 12/22/22 0459 12/23/22 0114  12/24/22 0402 12/25/22 0500  AST 43* 37 35 34  ALT 135* 113* 94* 97*  ALKPHOS 65 65 64 68  BILITOT 1.5* 1.4* 1.1 1.7*  PROT 5.5* 5.5* 5.5* 6.0*  ALBUMIN 2.8* 2.7* 2.7* 2.8*   No results for input(s): "LIPASE", "AMYLASE" in the last 168 hours.  No results for input(s): "AMMONIA" in the last 168 hours. Coagulation profile Recent Labs  Lab 12/22/22 0608  INR 1.3*    CBC: Recent Labs  Lab 12/22/22 0459 12/22/22 1450 12/22/22 1926 12/23/22 0114 12/24/22 0402 12/25/22 0500 12/27/22 0519  WBC 4.5  --   --  5.1 5.3 7.0 7.1  NEUTROABS 2.7  --   --  3.9 3.5 5.0 4.5  HGB 9.8*   < > 10.3* 9.4* 9.2* 9.6* 8.7*  HCT 31.5*   < > 33.4* 30.8* 29.7* 31.3* 28.3*  MCV 92.1  --   --  91.1 91.4 91.5 91.0  PLT 55*  --   --  56* 64* 96* 185   < > = values in this interval not displayed.   Cardiac Enzymes: No results for input(s): "CKTOTAL", "CKMB", "CKMBINDEX", "TROPONINI" in the last 168 hours.  BNP (last 3 results) No results for input(s): "PROBNP" in the last 8760 hours. CBG: Recent Labs  Lab 12/27/22 1625 12/27/22 2036 12/28/22 0751 12/28/22 1255 12/28/22 1613  GLUCAP 129* 135* 103* 131* 113*   D-Dimer: No results for input(s): "DDIMER" in the last 72 hours.  Hgb A1c: No results for input(s): "HGBA1C" in the last 72 hours. Lipid Profile: No results for input(s): "CHOL", "HDL", "LDLCALC", "TRIG", "CHOLHDL", "LDLDIRECT" in the last 72 hours. Thyroid function studies: No results for input(s): "TSH", "T4TOTAL", "T3FREE",  "THYROIDAB" in the last 72 hours.  Invalid input(s): "FREET3" Anemia work up: No results for input(s): "VITAMINB12", "FOLATE", "FERRITIN", "TIBC", "IRON", "RETICCTPCT" in the last 72 hours. Sepsis Labs: Recent Labs  Lab 12/23/22 0114 12/24/22 0402 12/24/22 1651 12/25/22 0120 12/25/22 0500 12/26/22 0451 12/27/22 0519  PROCALCITON  --   --  <0.10  --  0.13 <0.10  --   WBC 5.1 5.3  --   --  7.0  --  7.1  LATICACIDVEN  --   --  0.8 1.0  --   --   --     Microbiology Recent Results (from the past 240 hour(s))  Culture, blood (Routine X 2) w Reflex to ID Panel     Status: None (Preliminary result)   Collection Time: 12/24/22  4:58 PM   Specimen: BLOOD  Result Value Ref Range Status   Specimen Description BLOOD BLOOD LEFT HAND  Final   Special Requests   Final    BOTTLES DRAWN AEROBIC AND ANAEROBIC Blood Culture adequate volume   Culture   Final    NO GROWTH 4 DAYS Performed at [facility], [address] [place], [place] [address]    Report Status PENDING  Incomplete  Culture, blood (Routine X 2) w Reflex to ID Panel     Status: None (Preliminary result)   Collection Time: 12/24/22  5:04 PM   Specimen: BLOOD  Result Value Ref Range Status   Specimen Description BLOOD BLOOD RIGHT HAND  Final   Special Requests   Final    BOTTLES DRAWN AEROBIC AND ANAEROBIC Blood Culture adequate volume   Culture   Final    NO GROWTH 4 DAYS Performed at [facility], [address] [place], [place] [address]    Report Status PENDING  Incomplete  Procedures and diagnostic studies:  No results found.             LOS: 11 days   [place] Copywriter, advertising on [email or web address]. If 7PM-7AM, please contact night-coverage at [email or web address]     12/28/2022, 4:57 PM

## 2022-12-28 NOTE — Progress Notes (Signed)
Patient suffers from acute pancreatits, aspiration PNA, AKI, which impairs their ability to perform daily activities like bathing, dressing, and grooming in the home.  A walker will not resolve issue with performing activities of daily living. A wheelchair will allow patient to safely perform daily activities. Patient can safely propel the wheelchair in the home or has a caregiver who can provide assistance. Length of need Lifetime.  Accessories: elevating leg rests (ELRs), wheel locks, extensions and anti-tippers  [place] Fam, [place] Lyn Deemer, MSW, [place]

## 2022-12-29 DIAGNOSIS — T68XXXA Hypothermia, initial encounter: Secondary | ICD-10-CM | POA: Diagnosis not present

## 2022-12-29 LAB — CULTURE, BLOOD (ROUTINE X 2)
Culture: NO GROWTH
Culture: NO GROWTH
Special Requests: ADEQUATE
Special Requests: ADEQUATE

## 2022-12-29 LAB — GLUCOSE, CAPILLARY: Glucose-Capillary: 86 mg/dL (ref 70–99)

## 2022-12-29 MED ORDER — PANTOPRAZOLE SODIUM 40 MG PO TBEC: 40.0000 mg | DELAYED_RELEASE_TABLET | Freq: Every day | ORAL | 0 refills | Status: AC

## 2022-12-29 MED ORDER — AMLODIPINE BESYLATE 10 MG PO TABS: 10.0000 mg | ORAL_TABLET | Freq: Every day | ORAL | 0 refills | Status: AC

## 2022-12-29 MED ORDER — ORAL CARE MOUTH RINSE: 15.0000 mL | OROMUCOSAL | Status: DC

## 2022-12-29 MED ORDER — ORAL CARE MOUTH RINSE: 15.0000 mL | OROMUCOSAL | Status: DC | PRN

## 2022-12-29 NOTE — Progress Notes (Signed)
Speech Language Pathology Treatment: Dysphagia  Patient Details Name: Jill Smith MRN: [id number] DOB: 1951-09-20  Today's Date: 12/29/2022 Time: 5993-5701 SLP Time Calculation (min) (ACUTE ONLY): 35 min  Assessment / Plan / Recommendation Clinical Impression  Pt seen for for ongoing assessment of swallowing today; trials to upgrade diet to soft solids w/ the thin liquids if possible. Pt awake, verbally responsive to few basic questions though tangential, mumbled response - did not immediately respond except to request something. Suspect degree of Cognitive change/decline-- see Head CT results indicating Atrophy and pt's baseline Psychiatric dis. She does not wear her dentures. Pt's request this session: "gimme some water". Sister present during session and endorsed that "pt ate regular foods at home b/f this, even w/out teeth". She also endorsed pt was Impulsive in her eating/drinking behaviors.  On RA; afebrile. WBC wnl.   Pt appears improved in her swallowing abilities but does exhibit mild oral phase dysphagia in setting of Edentulous status(w/ solid foods); she is also Impulsive when drinking/eating and needs Supervision. Risk for dysphagia are min increased in setting of Psychiatric illness/dxs, acute illness, and suspected, declined Cognitive status. ANY Cognitive decline can impact overall awareness/timing of swallow and safety during po tasks which increases risk for aspiration, choking. Pt's risk for aspiration can be reduced when following general aspiration precautions, given feeding support and Supervision for Impulsive drinking and eating behaviors, and using a modified diet consistency of softened/chopped foods(does not wear Dentures at home per family).   She also requires verbal/visual cues for follow through during po tasks and feeding support, Supervision.        Pt consumed several trials of thin liquids via Straw w/ Pinched straw and softened, cut solids w/ No immediate, overt  clinical s/s of aspiration noted; no coughing, no decline in vocal quality, no decline in respiratory status during/post trials occurred. Oral phase was grossly adequate for bolus management, A-P transit, and oral clearing of the boluses given. INcreased Time was necessary for mashing/gumming of solid foods even though moistened well -- Sister stated oral phase similar to that at home w/ foods d/t the Edentulous status. Verbal/tactile cues utilized during session. Pt appears to have decreased insight into safety awareness and often impulsively drank when not supervised by this SLP.         In setting of acute illness, Baseline Psychiatric illness/dxs w/ Cognitive decline, Edentulous status, dependency w/ feeding/Supervision, and her risk for aspiration, recommend a dysphagia level 3(mech soft foods chopped/moistened for ease of oral phase and flavor) w/ thin liquids; aspiration precautions; reduce Distractions during meals and engage pt during meals for self-feeding. Pills Whole vs Crushed in Puree for safer swallowing as needed. Support w/ feeding at meals to reduce Impulsive drinking behaviors and check for oral clearing as needed.  Barriers/Prognosis Comment: baseline psychiatric illness/dxs; see Head CT results re: Atrophy. MD/NSG updated.  ST services recommends follow w/ Palliative Care for [place] moving forward and education re: impact of any Cognitive decline/illness on swallowing. Precautions posted in room, chart.  Pt appears close to/at her baseline per Sister's report/observation this session. ST services will sign off at this time w/ NSG to reconsult if new needs arise during admit.         HPI HPI: Pt is a 71 y.o. female with medical history significant for Bipolar disorder, Schizophrenia, depression, hypertension, Obesity, tubular adenoma of colon, who presented to the emergency department on 12/17/2022 with hypothermia and altered mental status.  According to EMS, she was non-verbal and  that  is not her baseline.  Reportedly, the heater in her home had stopped working on the night of 12/16/2022, and she was found the following morning with altered mental status.  She was admitted to the hospital for severe hypothermia and acute pancreatitis with peripancreatic necrosis.  General surgeon and gastroenterologist were consulted, and they recommended conservative management.  She was treated with empiric IV antibiotics and IV fluids.  She developed asystole cardiac arrest on 12/18/2022,  around 2 AM in the morning.  She was intubated and placed on mechanical ventilation.  In the ICU, she required low-dose vasopressors.  She was intubated x3 days.   CXR: No active disease.  Head CT: No CT evidence for acute intracranial abnormality. Atrophy and  chronic small vessel ischemic changes of the white matter.      SLP Plan  All goals met      Recommendations for follow up therapy are one component of a multi-disciplinary discharge planning process, led by the attending physician.  Recommendations may be updated based on patient status, additional functional criteria and insurance authorization.    Recommendations  Diet recommendations: Dysphagia 3 (mechanical soft);Thin liquid Liquids provided via: Cup;Straw (monitor) Medication Administration: Whole meds with puree (vs need to Crush) Supervision: Staff to assist with self feeding;Full supervision/cueing for compensatory strategies Compensations: Minimize environmental distractions;Slow rate;Small sips/bites;Lingual sweep for clearance of pocketing;Follow solids with liquid;Multiple dry swallows after each bite/sip Postural Changes and/or Swallow Maneuvers: Out of bed for meals;Upright 30-60 min after meal;Seated upright 90 degrees (Reflux precs)      PMSV Supervision: Intermittent         General recommendations:  (Dietician f/u; Palliative Care f/u for [name] moving forward) Oral Care Recommendations: Oral care BID;Oral care before and after  PO;Staff/trained caregiver to provide oral care Follow Up Recommendations: Skilled nursing-short term rehab (<3 hours/day) (vs Home d/c TBD) Assistance recommended at discharge: Frequent or constant Supervision/Assistance SLP Visit Diagnosis: Dysphagia, oral phase (R13.11) (Edentulous; baseline psychiatric illness/dxs) Plan: All goals met            Orinda Kenner, MS, [place] [facility]; [place] [phone] (ascom) Shetara Launer  12/29/2022, 1:51 PM

## 2022-12-29 NOTE — Discharge Summary (Addendum)
Physician Discharge Summary   Patient: Jill Smith MRN: [id number] DOB: [birth year]  Admit date:     12/17/2022  Discharge date: 12/29/22  Discharge Physician: George Hugh   PCP: [facility] [place]   Recommendations at discharge:   Follow up with [facility] Dr. Ronny Bacon in one week to manage GI symptoms and cholelithiasis.  Referral was placed.  This was discussed with patient's sister Jill Smith. Please follow up with regular Podiatrist Dr. Yvetta Coder at [facility] to manage left great toe ulcer.  Wound Care instructions include: Please cleanse with soap and water, rinse and dry.  Apply a betadine dressing, top with dry dressing and secure with Kerlix roll gauze/paper tape. Change daily. Float heel.  Please follow dysphagia 1 diet with thin liquids.  Discharge Diagnoses: Principal Problem:   Hypothermia Active Problems:   Acute pancreatitis   Cardiac arrest ([place])   Calculus of gallbladder with acute cholecystitis without obstruction   Sepsis ([place])   Aspiration pneumonia (HCC)   Pressure injury of skin   SIRS (systemic inflammatory response syndrome) (HCC)   AKI (acute kidney injury) (HCC)   AV block, Mobitz 2   Non-traumatic compartment syndrome of abdomen   NSVT (nonsustained ventricular tachycardia) (HCC)   Hypernatremia   Epistaxis  Resolved Problems:   * No resolved hospital problems. *  Hospital Course: 71 year old female who is nonverbal at baseline with a PMH of schizophrenia, bipolar disorder, hypertension, and a chronic left great toe ulcer presented to the ED on 12/18 with acute metabolic encephalopathy in the setting of severe hypothermia (family reported that the heater was nonfunctional at home) and septic shock secondary to pneumonia, and acute pancreatitis with early necrosis and large gallstones.  A bear hugger was placed and patient became bradycardic.  At 2am, patient developed cardiac arrest in the  setting of hypothermia, underwent CPR and was shocked, and was intubated.  Critical Care, Cardiology, GI and General Surgery, and Neurology were consulted.    On 12/19, she was started on pressors.  She improved with antibiotics, fluids, and conservative management.  On 12/20, intraabdominal pressures were measured at 40 mmHg (not on paralytics).  On 12/21, intraabdominal pressures were 19 mmHg.  Pressors were stopped.  She was extubated.  Assessment and Plan:  S/P In Hospital PEA Arrest: - Echo showed normal EF.  Occasional Mobitz Type 2 Heart Block on telemetry: - Cardiology did not recommend pacemaker at this time.  Grade 3 Diastolic Heart Failure / Restrictive dysfunction: Echo showed normal EF and no acute abnormality. - Volume status is stable.  Distributive Shock: Patient was briefly on pressors from 12/19-12/21. - Blood pressure is stable.  Aspiration Pneumonia: This was treated with a course of IV Zosyn. - Patient is asymptomatic.  Acute Hypoxic Respiratory Failure, resolved: Patient was intubated from 12/18-12/21. - Oxygen saturation is stable on room air on the day of discharge.  Acute Pancreatitis with early necrosis / cholelithiasis with 8 mm CBD: GI and Surgery were consulted who did not recommend any intervention.  Patient was treated with fluids and supportive care. - Abdominal exam is stable. - CT imaging documented cholelithiasis.  Patient will follow up with Dr. Christian Mate outpatient.  Acute Metabolic Encephalopathy: EEG was performed while patient was encephalopathic and critically ill.  It revealed moderate to severe diffuse slowing indicative of global cerebral dysfunction.  Epileptiform abnormalities were not seen  - Mental status is stable.  Acute on Chronic Epistaxis: On 12/23, patient had  episode of bleeding that resolved with nasal packing. - ENT Dr. Tami Ribas recommends Afrin nasal spray and applying pressure as needed. - HH is stable.  Chronic  Dysphagia: - Speech therapy recommends dysphagia 1 diet with thin liquids.  Morbid Obesity: BMI 38.12 kg/m2       Consultants: Critical Care, Cardiology, Gastroenterology, General Surgery, ENT. Procedures performed:  12/18 Intubation, Arterial Line, Right IJV 12/21 Extubation Disposition: Home health PT recommends SNF but sister requests to take patient home with home health.  DME equipment has arrived to patient's home. Diet recommendation:  Dysphagia type 1 thin Liquid DISCHARGE MEDICATION: Allergies as of 12/29/2022   No Known Allergies      Medication List     STOP taking these medications    amoxicillin-clavulanate 875-125 MG tablet Commonly known as: AUGMENTIN   ciprofloxacin 500 MG tablet Commonly known as: CIPRO       TAKE these medications    amLODipine 10 MG tablet Commonly known as: NORVASC Take 1 tablet (10 mg total) by mouth daily. What changed:  medication strength how much to take   aspirin EC 81 MG tablet Take 81 mg by mouth daily.   docusate sodium 100 MG capsule Commonly known as: COLACE Take 100 mg by mouth daily as needed for mild constipation.   isosorbide mononitrate 60 MG 24 hr tablet Commonly known as: IMDUR Take 60 mg by mouth daily.   lisinopril 20 MG tablet Commonly known as: ZESTRIL Take 20 mg by mouth daily.   metoprolol tartrate 25 MG tablet Commonly known as: LOPRESSOR Take 25 mg by mouth 2 (two) times daily.   Nyamyc powder Generic drug: nystatin Apply 1 Application topically 2 (two) times daily.   paliperidone 234 MG/1.5ML injection Commonly known as: INVEGA SUSTENNA Inject 234 mg into the muscle every 30 (thirty) days. Last administration 12/08/22   pantoprazole 40 MG tablet Commonly known as: PROTONIX Take 1 tablet (40 mg total) by mouth daily. Start taking on: December 30, 2022   QUEtiapine 100 MG tablet Commonly known as: SEROQUEL Take 100 mg by mouth at bedtime.   vitamin E 180 MG (400 UNITS)  capsule Take 800 Units by mouth 2 (two) times daily.               Durable Medical Equipment  (From admission, onward)           Start     Ordered   12/28/22 0000  For home use only DME 4 wheeled rolling walker with seat       Question:  Patient needs a walker to treat with the following condition  Answer:  General weakness   12/28/22 1053   12/28/22 0000  For home use only DME Other see comment       Comments: mattress  Question:  Length of Need  Answer:  Lifetime   12/28/22 1053   12/28/22 0000  For home use only DME Other see comment       Comments: Bariatric wheelchair  Question:  Length of Need  Answer:  Lifetime   12/28/22 1220   12/28/22 0000  For home use only DME 3 n 1       Comments: Bariatric   12/28/22 1328              Discharge Care Instructions  (From admission, onward)           Start     Ordered   12/29/22 0000  Discharge wound care:  Comments: Wound care to left great toe: Cleanse with soap and water, rinse and dry. Apply a betadine dressing, top with dry dressing and secure with Kerlix roll gauze/paper tape. Change daily. Float heel.   12/29/22 1118            Follow-up Information     Ronny Bacon, MD. Schedule an appointment as soon as possible for a visit in 1 month(s).   Specialty: General Surgery Why: Cholelithiasis, recent acute pancreatitis Contact information: [address] [address] [phone]                Discharge Exam: Danley Danker Weights   12/23/22 0500 12/24/22 0406 12/26/22 0449  Weight: 120.8 kg 122.4 kg 117.2 kg   Physical Exam Constitutional:      Comments: Chronically ill appearing, obese female, NAD, non-verbal at baseline  HENT:     Head: Normocephalic and atraumatic.     Nose: Nose normal.     Mouth/Throat:     Mouth: Mucous membranes are moist.  Eyes:     Extraocular Movements: Extraocular movements intact.     Pupils: Pupils are equal, round, and  reactive to light.  Cardiovascular:     Rate and Rhythm: Normal rate and regular rhythm.     Pulses: Normal pulses.     Heart sounds: Normal heart sounds. No murmur heard. Pulmonary:     Effort: Pulmonary effort is normal.     Breath sounds: Normal breath sounds.  Abdominal:     General: There is no distension.     Palpations: Abdomen is soft.     Tenderness: There is no abdominal tenderness.  Musculoskeletal:     Cervical back: Normal range of motion and neck supple.  Skin:    Comments: Left great toe ulcer, chronic, stable  Neurological:     General: No focal deficit present.  Psychiatric:        Mood and Affect: Mood normal.      Condition at discharge: stable  The results of significant diagnostics from this hospitalization (including imaging, microbiology, ancillary and laboratory) are listed below for reference.   Imaging Studies: DG Chest Port 1 View  Result Date: 12/24/2022 CLINICAL DATA:  Fever EXAM: PORTABLE CHEST 1 VIEW COMPARISON:  Previous studies including the examination of 12/20/2022 FINDINGS: Transverse diameter of heart is increased. There are no signs of pulmonary edema. There is interval increase in opacity in right lower lung field. There is blunting of right lateral CP angle. There is no pneumothorax. There is interval removal of endotracheal tube and enteric tube. There is extrinsic pressure over the trachea in the lower neck, possibly due to enlarged thyroid. Tip of right IJ central venous catheter is seen at the junction of superior vena cava and right atrium. IMPRESSION: There is new increased density in right lower lung fields suggesting small pleural effusion and possibly underlying atelectasis/pneumonia. Electronically Signed   By: Elmer Picker M.D.   On: 12/24/2022 14:55   DG Chest Port 1 View  Result Date: 12/20/2022 CLINICAL DATA:  Pulmonary edema. EXAM: PORTABLE CHEST 1 VIEW COMPARISON:  December 17, 2022. FINDINGS: Endotracheal and  nasogastric tubes are in grossly good position. Stable cardiomegaly. Right internal jugular catheter is unchanged in position. Lungs are clear. Bony thorax is unremarkable. IMPRESSION: No active disease. Electronically Signed   By: Marijo Conception M.D.   On: 12/20/2022 08:03   CT CHEST ABDOMEN PELVIS WO CONTRAST  Result Date: 12/19/2022 CLINICAL DATA:  Altered  mental status, hypothermia. EXAM: CT CHEST, ABDOMEN AND PELVIS WITHOUT CONTRAST TECHNIQUE: Multidetector CT imaging of the chest, abdomen and pelvis was performed following the standard protocol without IV contrast. RADIATION DOSE REDUCTION: This exam was performed according to the departmental dose-optimization program which includes automated exposure control, adjustment of the mA and/or kV according to patient size and/or use of iterative reconstruction technique. COMPARISON:  December 17, 2022. FINDINGS: CT CHEST FINDINGS Cardiovascular: Atherosclerosis of thoracic aorta is noted without aneurysm formation. Normal cardiac size. No pericardial effusion. Mediastinum/Nodes: Thyroid gland is unremarkable. Endotracheal tube is in grossly good position. Nasogastric tube is seen passing through esophagus and into stomach. No definite adenopathy is noted. Lungs/Pleura: No pneumothorax is noted. Minimal bilateral pleural effusions are noted with mild adjacent subsegmental atelectasis. Mild emphysematous disease is noted. Right upper lobe air space opacity is noted posteriorly suggesting atelectasis or less likely infiltrate. 12 mm opacity is seen in right lung apex most consistent with inflammation. Musculoskeletal: Minimally displaced fractures are seen involving the anterior portion of the bilateral seventh and eighth ribs. CT ABDOMEN PELVIS FINDINGS Hepatobiliary: Cholelithiasis is noted without biliary dilatation. Liver is unremarkable. Pancreas: Mild inflammatory stranding is seen around the pancreas which may be decreased compared to prior exam,  consistent with probable pancreatitis. No definite pseudocyst formation is noted. Spleen: Normal in size without focal abnormality. Adrenals/Urinary Tract: Stable mild bilateral adrenal gland enlargement is noted suggesting adrenal hyperplasia. No hydronephrosis or renal obstruction is noted. No renal or ureteral calculi are noted. Urinary bladder is decompressed secondary to Foley catheter. Stomach/Bowel: Nasogastric tube tip is seen in distal stomach. No definite evidence of bowel obstruction or inflammation. The appendix is unremarkable. Vascular/Lymphatic: Aortic atherosclerosis. No enlarged abdominal or pelvic lymph nodes. Reproductive: Uterus and bilateral adnexa are unremarkable. Other: No definite hernia is noted. Small amount of free fluid is noted in the pelvis which may be related to inflammation or pancreatitis. Musculoskeletal: No acute or significant osseous findings. IMPRESSION: Minimally displaced fractures are seen involving the anterior portions of the bilateral seventh and eighth ribs. Minimal bilateral pleural effusions are noted with mild adjacent subsegmental atelectasis. Mild right upper lobe subsegmental atelectasis or infiltrate is noted. 12 mm subpleural rounded airspace opacity is noted in right lung apex which may represent focal inflammation, but follow-up CT scan in 3 months is recommended to ensure stability or resolution and rule out neoplasm. Cholelithiasis without inflammation. Decreased inflammatory stranding is noted around the pancreas consistent with mild pancreatitis which may be improved compared to prior exam. No pseudocyst formation is noted. Endotracheal and nasogastric tubes are in grossly good position. Stable mild bilateral adrenal gland enlargement is noted most consistent with adrenal hyperplasia. Small amount of free fluid is noted in the pelvis which may be physiologic, but alternately may be due to pancreatitis or inflammation. Aortic Atherosclerosis (ICD10-I70.0).  Electronically Signed   By: Marijo Conception M.D.   On: 12/19/2022 13:05   EEG adult  Result Date: 12/18/2022 Derek Jack, MD     12/18/2022  6:58 PM Routine EEG Report Jill Smith is a 71 y.o. female with a history of cardiac arrest and altered mental status who is undergoing an EEG to evaluate for seizures. Report: This EEG was acquired with electrodes placed according to the International 10-20 electrode system (including Fp1, Fp2, F3, F4, C3, C4, P3, P4, O1, O2, T3, T4, T5, T6, A1, A2, Fz, Cz, Pz). The following electrodes were missing or displaced: none. The best background was 4-5 Hz. This  activity is reactive to stimulation. There was no sleep architecture identified. There was no focal slowing. There were no interictal epileptiform discharges. There were no electrographic seizures identified. Photic stimulation and hyperventilation were not performed. Impression and clinical correlation: This EEG was obtained while comatose and is abnormal due to moderate-to-severe diffuse slowing indicative of global cerebral dysfunction. Epileptiform abnormalities were not seen during this recording. Su Monks, MD [facility] [phone] If 7pm- 7am, please page neurology on call as listed in [place].   ECHOCARDIOGRAM COMPLETE  Result Date: 12/18/2022    ECHOCARDIOGRAM REPORT   Patient Name:   KADASIA KASSING Date of Exam: 12/18/2022 Medical Rec #:  [id number]       Height:       69.0 in Accession #:    [id number]      Weight:       257.9 lb Date of Birth:  11-17-1951       BSA:          2.302 m Patient Age:    65 years        BP:           123/82 mmHg Patient Gender: F               HR:           43 bpm. Exam Location:  [facility] Procedure: 2D Echo, Color Doppler and Cardiac Doppler STAT ECHO Indications:     R06.00 Dyspnea  History:         Patient has no prior history of Echocardiogram examinations.                  Risk Factors:Hypertension.  Sonographer:     Charmayne Sheer Referring Phys:   [id number] [place] Diagnosing Phys: Neoma Laming  Sonographer Comments: Echo performed with patient supine and on artificial respirator. IMPRESSIONS  1. Left ventricular ejection fraction, by estimation, is 60 to 65%. The left ventricle has normal function. The left ventricle has no regional wall motion abnormalities. There is moderate left ventricular hypertrophy. Left ventricular diastolic parameters are consistent with Grade III diastolic dysfunction (restrictive).  2. Right ventricular systolic function is normal. The right ventricular size is normal.  3. Left atrial size was moderately dilated.  4. Right atrial size was mild to moderately dilated.  5. The mitral valve is normal in structure. Mild mitral valve regurgitation. No evidence of mitral stenosis.  6. Tricuspid valve regurgitation is mild to moderate.  7. The aortic valve is calcified. Aortic valve regurgitation is mild to moderate. Aortic valve sclerosis/calcification is present, without any evidence of aortic stenosis.  8. The inferior vena cava is normal in size with greater than 50% respiratory variability, suggesting right atrial pressure of 3 mmHg. FINDINGS  Left Ventricle: Left ventricular ejection fraction, by estimation, is 60 to 65%. The left ventricle has normal function. The left ventricle has no regional wall motion abnormalities. The left ventricular internal cavity size was normal in size. There is  moderate left ventricular hypertrophy. Left ventricular diastolic parameters are consistent with Grade III diastolic dysfunction (restrictive). Right Ventricle: The right ventricular size is normal. No increase in right ventricular wall thickness. Right ventricular systolic function is normal. Left Atrium: Left atrial size was moderately dilated. Right Atrium: Right atrial size was mild to moderately dilated. Pericardium: There is no evidence of pericardial effusion. Mitral Valve: The mitral valve is normal in structure. Mild mitral valve  regurgitation. No evidence of mitral valve stenosis. Tricuspid Valve: The  tricuspid valve is normal in structure. Tricuspid valve regurgitation is mild to moderate. No evidence of tricuspid stenosis. Aortic Valve: The aortic valve is calcified. Aortic valve regurgitation is mild to moderate. Aortic regurgitation PHT measures 805 msec. Aortic valve sclerosis/calcification is present, without any evidence of aortic stenosis. Aortic valve mean gradient measures 3.0 mmHg. Aortic valve peak gradient measures 6.7 mmHg. Aortic valve area, by VTI measures 2.58 cm. Pulmonic Valve: The pulmonic valve was normal in structure. Pulmonic valve regurgitation is mild. No evidence of pulmonic stenosis. Aorta: The aortic root is normal in size and structure. Venous: The inferior vena cava is normal in size with greater than 50% respiratory variability, suggesting right atrial pressure of 3 mmHg. IAS/Shunts: No atrial level shunt detected by color flow Doppler.  LEFT VENTRICLE PLAX 2D LVIDd:         4.30 cm   Diastology LVIDs:         2.90 cm   LV e' medial:    4.24 cm/s LV PW:         1.40 cm   LV E/e' medial:  20.8 LV IVS:        1.30 cm   LV e' lateral:   5.87 cm/s LVOT diam:     2.00 cm   LV E/e' lateral: 15.0 LV SV:         73 LV SV Index:   32 LVOT Area:     3.14 cm  RIGHT VENTRICLE RV Basal diam:  3.30 cm TAPSE (M-mode): 2.6 cm LEFT ATRIUM             Index        RIGHT ATRIUM           Index LA diam:        4.10 cm 1.78 cm/m   RA Area:     14.20 cm LA Vol (A2C):   62.8 ml 27.28 ml/m  RA Volume:   32.70 ml  14.21 ml/m LA Vol (A4C):   44.4 ml 19.29 ml/m LA Biplane Vol: 53.5 ml 23.24 ml/m  AORTIC VALVE                    PULMONIC VALVE AV Area (Vmax):    2.46 cm     PV Vmax:       0.97 m/s AV Area (Vmean):   2.24 cm     PV Vmean:      70.300 cm/s AV Area (VTI):     2.58 cm     PV VTI:        0.255 m AV Vmax:           129.00 cm/s  PV Peak grad:  3.7 mmHg AV Vmean:          86.000 cm/s  PV Mean grad:  2.0 mmHg AV VTI:             0.284 m AV Peak Grad:      6.7 mmHg AV Mean Grad:      3.0 mmHg LVOT Vmax:         101.00 cm/s LVOT Vmean:        61.400 cm/s LVOT VTI:          0.233 m LVOT/AV VTI ratio: 0.82 AI PHT:            805 msec  AORTA Ao Root diam: 3.40 cm MITRAL VALVE MV Area (PHT): 3.05 cm     SHUNTS MV Decel Time:  249 msec     Systemic VTI:  0.23 m MV E velocity: 88.00 cm/s   Systemic Diam: 2.00 cm MV A velocity: 107.00 cm/s MV E/A ratio:  0.82 Shaukat Khan Electronically signed by Neoma Laming Signature Date/Time: 12/18/2022/1:59:11 PM    Final    US Venous Img Lower Bilateral (DVT)  Result Date: 12/18/2022 CLINICAL DATA:  Elevated D-dimer.  Evaluate for DVT. EXAM: BILATERAL LOWER EXTREMITY VENOUS DOPPLER ULTRASOUND TECHNIQUE: Gray-scale sonography with graded compression, as well as color Doppler and duplex ultrasound were performed to evaluate the lower extremity deep venous systems from the level of the common femoral vein and including the common femoral, femoral, profunda femoral, popliteal and calf veins including the posterior tibial, peroneal and gastrocnemius veins when visible. The superficial great saphenous vein was also interrogated. Spectral Doppler was utilized to evaluate flow at rest and with distal augmentation maneuvers in the common femoral, femoral and popliteal veins. COMPARISON:  None Available. FINDINGS: RIGHT LOWER EXTREMITY Common Femoral Vein: No evidence of thrombus. Normal compressibility, respiratory phasicity and response to augmentation. Saphenofemoral Junction: No evidence of thrombus. Normal compressibility and flow on color Doppler imaging. Profunda Femoral Vein: No evidence of thrombus. Normal compressibility and flow on color Doppler imaging. Femoral Vein: No evidence of thrombus. Normal compressibility, respiratory phasicity and response to augmentation. Popliteal Vein: No evidence of thrombus. Normal compressibility, respiratory phasicity and response to augmentation. Calf Veins:  No evidence of thrombus. Normal compressibility and flow on color Doppler imaging. Superficial Great Saphenous Vein: No evidence of thrombus. Normal compressibility. Venous Reflux:  None. Other Findings:  None. LEFT LOWER EXTREMITY Common Femoral Vein: No evidence of thrombus. Normal compressibility, respiratory phasicity and response to augmentation. Saphenofemoral Junction: No evidence of thrombus. Normal compressibility and flow on color Doppler imaging. Profunda Femoral Vein: No evidence of thrombus. Normal compressibility and flow on color Doppler imaging. Femoral Vein: No evidence of thrombus. Normal compressibility, respiratory phasicity and response to augmentation. Popliteal Vein: No evidence of thrombus. Normal compressibility, respiratory phasicity and response to augmentation. Calf Veins: No evidence of thrombus. Normal compressibility and flow on color Doppler imaging. Superficial Great Saphenous Vein: No evidence of thrombus. Normal compressibility. Venous Reflux:  None. Other Findings:  None. IMPRESSION: No evidence of deep venous thrombosis in either lower extremity. Electronically Signed   By: Jacqulynn Cadet M.D.   On: 12/18/2022 11:02   DG Abd 1 View  Result Date: 12/17/2022 CLINICAL DATA:  NG tube placement EXAM: ABDOMEN - 1 VIEW COMPARISON:  None Available. FINDINGS: Enteric tube terminates in the gastric antrum. IMPRESSION: Enteric tube terminates in the gastric antrum. Electronically Signed   By: Julian Hy M.D.   On: 12/17/2022 23:59   DG Chest Port 1 View  Result Date: 12/17/2022 CLINICAL DATA:  Central line placement EXAM: PORTABLE CHEST 1 VIEW COMPARISON:  12/17/2022 at 1424 hours FINDINGS: Endotracheal tube terminates 15 mm above the carina. Mild patchy/interstitial markings, left upper lobe predominant. No pleural effusion or pneumothorax. Mild cardiomegaly. Right IJ venous catheter terminates cavoatrial junction. Enteric tube courses into the distal stomach.  Defibrillator pads overlying the left hemithorax. IMPRESSION: Right IJ venous catheter terminates cavoatrial junction. No pneumothorax. Endotracheal tube terminates 15 mm above the carina. Additional support apparatus as above. Mild patchy/interstitial markings, left upper lobe predominant, suspicious for pneumonia. Electronically Signed   By: Julian Hy M.D.   On: 12/17/2022 23:59   DG Foot 2 Views Left  Result Date: 12/17/2022 CLINICAL DATA:  Left great toe ulcer. EXAM: LEFT FOOT -  2 VIEW COMPARISON:  None Available. FINDINGS: There is no evidence of fracture or dislocation. There is no evidence of arthropathy or other focal bone abnormality. Large ulceration is vulva in the plantar aspect of the soft tissues of the first toe. IMPRESSION: Large soft tissue ulceration involving the first toe is noted. No definite lytic destruction is seen to suggest osteomyelitis. Electronically Signed   By: Marijo Conception M.D.   On: 12/17/2022 17:10   CT Head Wo Contrast  Result Date: 12/17/2022 CLINICAL DATA:  Altered mental status EXAM: CT HEAD WITHOUT CONTRAST CT CERVICAL SPINE WITHOUT CONTRAST TECHNIQUE: Multidetector CT imaging of the head and cervical spine was performed following the standard protocol without intravenous contrast. Multiplanar CT image reconstructions of the cervical spine were also generated. RADIATION DOSE REDUCTION: This exam was performed according to the departmental dose-optimization program which includes automated exposure control, adjustment of the mA and/or kV according to patient size and/or use of iterative reconstruction technique. COMPARISON:  CT brain 01/04/2022, CT cervical spine 07/01/2010 FINDINGS: CT HEAD FINDINGS Brain: No acute territorial infarction, hemorrhage, or intracranial mass. Mild atrophy. Patchy white matter hypodensity consistent with chronic small vessel ischemic change. Stable ventricle size. 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 Vascular: No hyperdense vessels. Vertebral and carotid vascular calcification Skull: Normal. Negative for fracture or focal lesion. Sinuses/Orbits: No acute finding. Other: None CT CERVICAL SPINE FINDINGS Alignment: Mild reversal of cervical lordosis. No subluxation. Facet alignment within normal limits Skull base and vertebrae: No acute fracture. No primary bone lesion or focal pathologic process. Soft tissues and spinal canal: Thickened appearance of the prevertebral soft tissues but somewhat similar appearance compared with 2011. Disc levels: Multilevel degenerative change. Moderate disc space narrowing C3 through C7 with prominent anterior osteophytes C4 through C7. Facet degenerative changes at multiple levels. Upper chest: Heterogeneous airspace opacities at the apices with left effusion. Subcentimeter nodule in the right lobe of thyroid, no further imaging evaluation is recommended. Other: None IMPRESSION: 1. No CT evidence for acute intracranial abnormality. Atrophy and chronic small vessel ischemic changes of the white matter. 2. Mild reversal of cervical lordosis with degenerative changes. No acute osseous abnormality. 3. Enlarged appearing prevertebral soft tissues without obvious focal fluid collection allowing for absence of contrast. Somewhat similar appearance on CT from 2011 suggesting that findings could be due to habitus and positioning. If there is further concern for soft tissue injury or infection, further assessment with MRI could be considered. 4. Heterogeneous airspace opacities at the apices/possible pneumonia with left effusion. Electronically Signed   By: Donavan Foil M.D.   On: 12/17/2022 15:37   CT Cervical Spine Wo Contrast  Result Date: 12/17/2022 CLINICAL DATA:  Altered mental status EXAM: CT HEAD WITHOUT CONTRAST CT CERVICAL SPINE WITHOUT CONTRAST TECHNIQUE: Multidetector CT imaging of the head and cervical spine was performed following the standard protocol without  intravenous contrast. Multiplanar CT image reconstructions of the cervical spine were also generated. RADIATION DOSE REDUCTION: This exam was performed according to the departmental dose-optimization program which includes automated exposure control, adjustment of the mA and/or kV according to patient size and/or use of iterative reconstruction technique. COMPARISON:  CT brain 01/04/2022, CT cervical spine 07/01/2010 FINDINGS: CT HEAD FINDINGS Brain: No acute territorial infarction, hemorrhage, or intracranial mass. Mild atrophy. Patchy white matter hypodensity consistent with chronic small vessel ischemic change. Stable ventricle size. 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 Vascular: No hyperdense vessels. Vertebral and carotid vascular calcification  Skull: Normal. Negative for fracture or focal lesion. Sinuses/Orbits: No acute finding. Other: None CT CERVICAL SPINE FINDINGS Alignment: Mild reversal of cervical lordosis. No subluxation. Facet alignment within normal limits Skull base and vertebrae: No acute fracture. No primary bone lesion or focal pathologic process. Soft tissues and spinal canal: Thickened appearance of the prevertebral soft tissues but somewhat similar appearance compared with 2011. Disc levels: Multilevel degenerative change. Moderate disc space narrowing C3 through C7 with prominent anterior osteophytes C4 through C7. Facet degenerative changes at multiple levels. Upper chest: Heterogeneous airspace opacities at the apices with left effusion. Subcentimeter nodule in the right lobe of thyroid, no further imaging evaluation is recommended. Other: None IMPRESSION: 1. No CT evidence for acute intracranial abnormality. Atrophy and chronic small vessel ischemic changes of the white matter. 2. Mild reversal of cervical lordosis with degenerative changes. No acute osseous abnormality. 3. Enlarged appearing prevertebral soft tissues without obvious focal fluid collection  allowing for absence of contrast. Somewhat similar appearance on CT from 2011 suggesting that findings could be due to habitus and positioning. If there is further concern for soft tissue injury or infection, further assessment with MRI could be considered. 4. Heterogeneous airspace opacities at the apices/possible pneumonia with left effusion. Electronically Signed   By: Donavan Foil M.D.   On: 12/17/2022 15:37   CT ABDOMEN PELVIS WO CONTRAST  Result Date: 12/17/2022 CLINICAL DATA:  A 71 year old presents for evaluation of altered mental status. Reported hypothermia. EXAM: CT ABDOMEN AND PELVIS WITHOUT CONTRAST TECHNIQUE: Multidetector CT imaging of the abdomen and pelvis was performed following the standard protocol without IV contrast. RADIATION DOSE REDUCTION: This exam was performed according to the departmental dose-optimization program which includes automated exposure control, adjustment of the mA and/or kV according to patient size and/or use of iterative reconstruction technique. COMPARISON:  None available. FINDINGS: Lower chest: Basilar atelectasis. Patchy ground-glass and small bilateral pleural effusions greatest on the RIGHT. Heart size is enlarged. No gross chest wall abnormality. Irregular RIGHT lower lobe pulmonary nodule measuring 7 x 6 mm (image 19/4) Hepatobiliary: Ascites adjacent to the liver. Fissural widening of hepatic fissures. Cholelithiasis. No gross biliary duct dilation or focal hepatic lesion on noncontrast imaging. Pancreas: Peripancreatic stranding worse in the pancreatic-duodenal groove. No visible focal fluid collection though amorphous stranding tracks into the transverse mesocolon. Stranding is moderate to marked. Amorphous fluid density in the transverse mesocolon and or lesser sac (image 39/2) 6 x 2.4 cm measuring 20 for to 28 Hounsfield units Spleen: Unremarkable aside from trace perisplenic fluid. Adrenals/Urinary Tract: Stranding adjacent to the LEFT adrenal gland.  Mild stranding adjacent to RIGHT adrenal gland. Adreniform thickening of bilateral adrenal glands. Mild perinephric stranding. No hydronephrosis. No perivesical stranding. Stomach/Bowel: Small bowel without dilation or inflammation. Stranding in the central abdominal mesenteries and transverse mesocolon in the setting of presumed pancreatitis. Normal appendix. Rectal temperature probe in place. Mild thickening of the splenic flexure and transverse colon likely secondary to pancreatic inflammation Vascular/Lymphatic: Aortic atherosclerosis. No sign of aneurysm. Smooth contour of the IVC. There is no gastrohepatic or hepatoduodenal ligament lymphadenopathy. No retroperitoneal or mesenteric lymphadenopathy. No pelvic sidewall lymphadenopathy. Reproductive: Unremarkable by CT. Other: Large abdominal wall pannus. This is incompletely assessed but no gross stranding or skin thickening in imaged portions. Small volume ascites. No pelvic ascites currently. Musculoskeletal: No acute bone finding. No destructive bone process. Spinal degenerative changes. IMPRESSION: 1. Findings of acute pancreatitis with early peripancreatic necrosis in the transverse mesocolon versus developing phlegmon, no organized collection best  characterized as interstitial edematous pancreatitis at this time. Fluid also tracks into the anterior pararenal space. Changes are moderate to marked in terms of inflammation. 2. Cholelithiasis with signs of 8 mm common bile duct. Correlate with any laboratory evidence of biliary obstruction. Large gallstone seen in the neck of the gallbladder as well. MRCP may be helpful for further evaluation as warranted. 3. Small volume ascites and bilateral pleural effusions. 4. Adrenal hyperplasia. Could consider correlation with adrenal function if not yet performed. 5. Patchy ground-glass and small bilateral pleural effusions greatest on the RIGHT. Correlate with any potential for aspiration. Given asymmetry this is  considered. 6. Irregular RIGHT lower lobe pulmonary nodule measuring 7 x 6 mm. Non-contrast chest CT at 6-12 months is recommended. If the nodule is stable at time of repeat CT, then future CT at 18-24 months (from today's scan) is considered optional for low-risk patients, but is recommended for high-risk patients. This recommendation follows the consensus statement: Guidelines for Management of Incidental Pulmonary Nodules Detected on CT Images: From the Fleischner Society 2017; Radiology 2017; 284:228-243. Aortic Atherosclerosis (ICD10-I70.0). Above findings related to potential pancreatitis were related to the provider is outlined below. These results were called by telephone at the time of interpretation on 12/17/2022 at 3:35 pm to provider MARK QUALE , who verbally acknowledged these results. Electronically Signed   By: Zetta Bills M.D.   On: 12/17/2022 15:36   DG Chest Port 1 View  Result Date: 12/17/2022 CLINICAL DATA:  Questionable sepsis, nonverbal, not baseline EXAM: PORTABLE CHEST 1 VIEW COMPARISON:  Portable exam 1422 hours compared to 02/09/2019 FINDINGS: External pacing leads project over chest. Rotated to the RIGHT. Normal heart size and mediastinal contours. Lungs grossly clear. No definite infiltrate, pleural effusion, or pneumothorax. IMPRESSION: No definite acute abnormalities. Electronically Signed   By: Lavonia Dana M.D.   On: 12/17/2022 14:43    Microbiology: Results for orders placed or performed during the hospital encounter of 12/17/22  Blood Culture (routine x 2)     Status: None   Collection Time: 12/17/22  2:01 PM   Specimen: BLOOD  Result Value Ref Range Status   Specimen Description BLOOD RIGHT ANTECUBITAL  Final   Special Requests   Final    BOTTLES DRAWN AEROBIC AND ANAEROBIC Blood Culture adequate volume   Culture   Final    NO GROWTH 5 DAYS Performed at [facility], [address] [place], [place] [address]    Report Status 12/22/2022 FINAL  Final   Blood Culture (routine x 2)     Status: None   Collection Time: 12/17/22  2:01 PM   Specimen: BLOOD  Result Value Ref Range Status   Specimen Description BLOOD LEFT ANTECUBITAL  Final   Special Requests   Final    BOTTLES DRAWN AEROBIC AND ANAEROBIC Blood Culture results may not be optimal due to an inadequate volume of blood received in culture bottles   Culture   Final    NO GROWTH 5 DAYS Performed at [facility], [address], [place], [place] [address]    Report Status 12/22/2022 FINAL  Final  Resp Panel by RT-PCR (Flu A&B, Covid) Anterior Nasal Swab     Status: None   Collection Time: 12/17/22  2:06 PM   Specimen: Anterior Nasal Swab  Result Value Ref Range Status   SARS Coronavirus 2 by RT PCR NEGATIVE NEGATIVE Final    Comment: (NOTE) SARS-CoV-2 target nucleic acids are NOT DETECTED.  The SARS-CoV-2 RNA is  generally detectable in upper respiratory specimens during the acute phase of infection. The lowest concentration of SARS-CoV-2 viral copies this assay can detect is 138 copies/mL. A negative result does not preclude SARS-Cov-2 infection and should not be used as the sole basis for treatment or other patient management decisions. A negative result may occur with  improper specimen collection/handling, submission of specimen other than nasopharyngeal swab, presence of viral mutation(s) within the areas targeted by this assay, and inadequate number of viral copies(<138 copies/mL). A negative result must be combined with clinical observations, patient history, and epidemiological information. The expected result is Negative.  Fact Sheet for Patients:  EntrepreneurPulse.com.au  Fact Sheet for Healthcare Providers:  IncredibleEmployment.be  This test is no t yet approved or cleared by the [place] FDA and  has been authorized for detection and/or diagnosis of SARS-CoV-2 by FDA under an Emergency Use Authorization  (EUA). This EUA will remain  in effect (meaning this test can be used) for the duration of the COVID-19 declaration under Section 564(b)(1) of the Act, 21 U.S.C.section 360bbb-3(b)(1), unless the authorization is terminated  or revoked sooner.       Influenza A by PCR NEGATIVE NEGATIVE Final   Influenza B by PCR NEGATIVE NEGATIVE Final    Comment: (NOTE) The Xpert Xpress SARS-CoV-2/FLU/RSV plus assay is intended as an aid in the diagnosis of influenza from Nasopharyngeal swab specimens and should not be used as a sole basis for treatment. Nasal washings and aspirates are unacceptable for Xpert Xpress SARS-CoV-2/FLU/RSV testing.  Fact Sheet for Patients: EntrepreneurPulse.com.au  Fact Sheet for Healthcare Providers: IncredibleEmployment.be  This test is not yet approved or cleared by the [place] FDA and has been authorized for detection and/or diagnosis of SARS-CoV-2 by FDA under an Emergency Use Authorization (EUA). This EUA will remain in effect (meaning this test can be used) for the duration of the COVID-19 declaration under Section 564(b)(1) of the Act, 21 U.S.C. section 360bbb-3(b)(1), unless the authorization is terminated or revoked.  Performed at [facility], [address], [place], [place] [address]   Urine Culture     Status: None   Collection Time: 12/17/22  3:58 PM   Specimen: In/Out Cath Urine  Result Value Ref Range Status   Specimen Description   Final    IN/OUT CATH URINE Performed at [facility], [address] [place], [place] [address]    Special Requests   Final    NONE Performed at [facility], [address] [place], [place] [address]    Culture   Final    NO GROWTH Performed at [facility], [place] [address] [place], [place] [address]    Report Status 12/18/2022 FINAL  Final  MRSA Next Gen by PCR, Nasal     Status: None   Collection Time: 12/17/22  9:02 PM    Specimen: Nasal Mucosa; Nasal Swab  Result Value Ref Range Status   MRSA by PCR Next Gen NOT DETECTED NOT DETECTED Final    Comment: (NOTE) The GeneXpert MRSA Assay (FDA approved for NASAL specimens only), is one component of a comprehensive MRSA colonization surveillance program. It is not intended to diagnose MRSA infection nor to guide or monitor treatment for MRSA infections. Test performance is not FDA approved in patients less than 33 years old. Performed at [facility], [address] [place],  [address]   Culture, Respiratory w Gram Stain     Status: None   Collection Time: 12/18/22  11:37 AM   Specimen: Tracheal Aspirate; Respiratory  Result Value Ref Range Status   Specimen Description   Final    TRACHEAL ASPIRATE Performed at [facility], [address], [place], [place] [address]    Special Requests   Final    NONE Performed at [facility], [address] [place], [place] [address]    Gram Stain   Final    RARE WBC PRESENT, PREDOMINANTLY PMN RARE BUDDING YEAST SEEN    Culture   Final    FEW Normal respiratory flora-no Staph aureus or Pseudomonas seen Performed at [facility] [address], [place], [place] [address]    Report Status 12/20/2022 FINAL  Final  Culture, blood (Routine X 2) w Reflex to ID Panel     Status: None   Collection Time: 12/24/22  4:58 PM   Specimen: BLOOD  Result Value Ref Range Status   Specimen Description BLOOD BLOOD LEFT HAND  Final   Special Requests   Final    BOTTLES DRAWN AEROBIC AND ANAEROBIC Blood Culture adequate volume   Culture   Final    NO GROWTH 5 DAYS Performed at [facility], [address] [place], [place] [address]    Report Status 12/29/2022 FINAL  Final  Culture, blood (Routine X 2) w Reflex to ID Panel     Status: None   Collection Time: 12/24/22  5:04 PM   Specimen: BLOOD  Result Value Ref Range Status   Specimen Description BLOOD BLOOD RIGHT HAND   Final   Special Requests   Final    BOTTLES DRAWN AEROBIC AND ANAEROBIC Blood Culture adequate volume   Culture   Final    NO GROWTH 5 DAYS Performed at [facility], [address] [place], [place] [address]    Report Status 12/29/2022 FINAL  Final    Labs: CBC: Recent Labs  Lab 12/22/22 1926 12/23/22 0114 12/24/22 0402 12/25/22 0500 12/27/22 0519  WBC  --  5.1 5.3 7.0 7.1  NEUTROABS  --  3.9 3.5 5.0 4.5  HGB 10.3* 9.4* 9.2* 9.6* 8.7*  HCT 33.4* 30.8* 29.7* 31.3* 28.3*  MCV  --  91.1 91.4 91.5 91.0  PLT  --  56* 64* 96* 433   Basic Metabolic Panel: Recent Labs  Lab 12/23/22 0114 12/24/22 0402 12/25/22 0500 12/27/22 0519  NA 146* 147* 145 145  K 4.5 4.0 4.0 4.0  CL 113* 115* 112* 115*  CO2 '27 29 27 24  '$ GLUCOSE 123* 122* 114* 85  BUN 38* 31* 32* 40*  CREATININE 1.32* 1.29* 1.24* 1.28*  CALCIUM 9.8 9.8 10.0 10.1  MG 2.5* 2.3 2.3  --   PHOS 3.5 2.0* 3.5  --    Liver Function Tests: Recent Labs  Lab 12/23/22 0114 12/24/22 0402 12/25/22 0500  AST 37 35 34  ALT 113* 94* 97*  ALKPHOS 65 64 68  BILITOT 1.4* 1.1 1.7*  PROT 5.5* 5.5* 6.0*  ALBUMIN 2.7* 2.7* 2.8*   CBG: Recent Labs  Lab 12/28/22 0751 12/28/22 1255 12/28/22 1613 12/28/22 2128 12/29/22 0822  GLUCAP 103* 131* 113* 137* 86    Discharge time spent: greater than 30 minutes.  Signed: George Hugh, MD [facility] 12/29/2022

## 2022-12-29 NOTE — TOC Transition Note (Addendum)
Transition of Care [facility]) - CM/SW Discharge Note   Patient Details  Name: Jill Smith MRN: [id number] Date of Birth: 06/03/1951  Transition of [facility] CM/SW Contact:  Gerilyn Pilgrim, LCSW Phone Number: 12/29/2022, 11:51 AM   Clinical Narrative:   DME delivered, Bariatric wheelchair, 3 in 1 and mattress for hospital bed. Sister at bedside requesting EMS. [facility] arranged for patient. Med necessity forms printed to the unit.[place] contacted to inform of patients discharge. No additional needs at this time.     Final next level of care: Home w Home Health Services Barriers to Discharge: Barriers Resolved   Patient Goals and CMS Choice CMS Medicare.gov Compare Post Acute Care list provided to:: Patient Represenative (must comment) (diana boswell) Choice offered to / list presented to : Patient, [facility] POA / Guardian  Discharge Placement                  Patient to be transferred to facility by: [facility] Name of family member notified: [place] Patient and family notified of of transfer: 12/29/22  Discharge Plan and Services Additional resources added to the After Visit Summary for     Discharge Planning Services: CM Consult              DME Agency: [employer] (hospital bed mattress, bariatric wheelchair and 3 in 1 commode.) Date DME Agency Contacted: 12/28/22     HH Arranged: OT, PT, Nurse's Aide [place] Agency: [place] Date [place]: 12/29/22 Time HH Agency Contacted: 1100 Representative spoke with at [place]: [place]  Social Determinants of Health ([place]) Interventions SDOH Screenings   Tobacco Use: Medium Risk (12/19/2022)     Readmission Risk Interventions    12/27/2022   10:37 AM  Readmission Risk Prevention Plan  Transportation Screening Complete  Home Care Screening Complete  Medication Review (RN CM) Complete

## 2022-12-30 IMAGING — MG MM DIGITAL SCREENING BILAT W/ TOMO AND CAD
6 of 10 series · 6 of 30 positions shown · non-contrast
Comparison: Previous exam(s).

ACR Breast Density Category a: The breast tissue is almost entirely
fatty.

CLINICAL DATA: Screening.

EXAM:
DIGITAL SCREENING BILATERAL MAMMOGRAM WITH TOMOSYNTHESIS AND CAD
TECHNIQUE: Bilateral screening digital craniocaudal and mediolateral oblique
mammograms were obtained. Bilateral screening digital breast
tomosynthesis was performed. The images were evaluated with
computer-aided detection.

[L MLO synth-2D (1 of 2)]
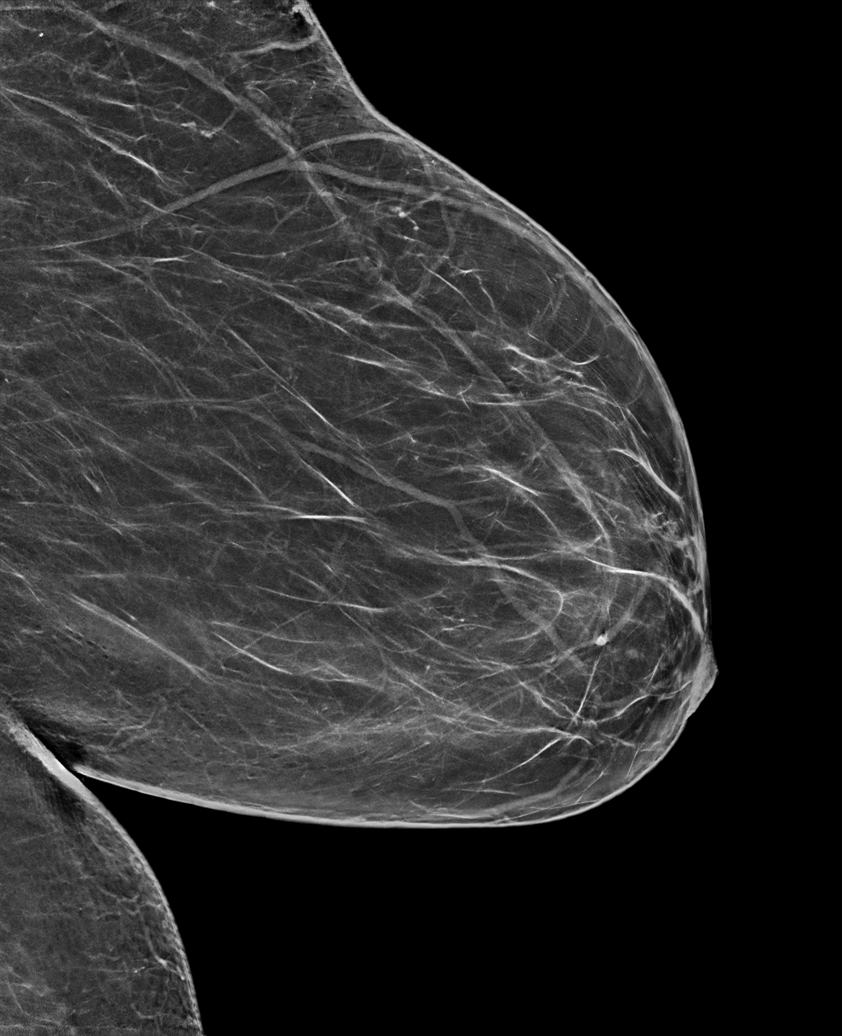

[R CC synth-2D]
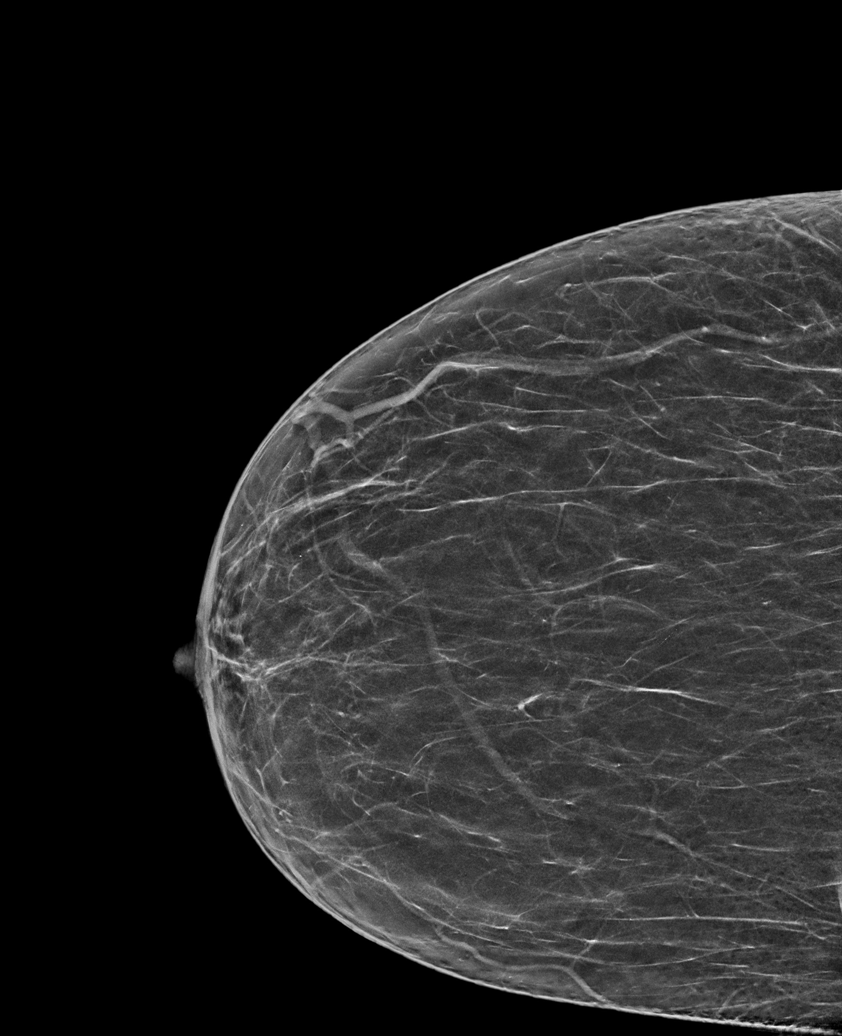

[L MLO synth-2D (2 of 2)]
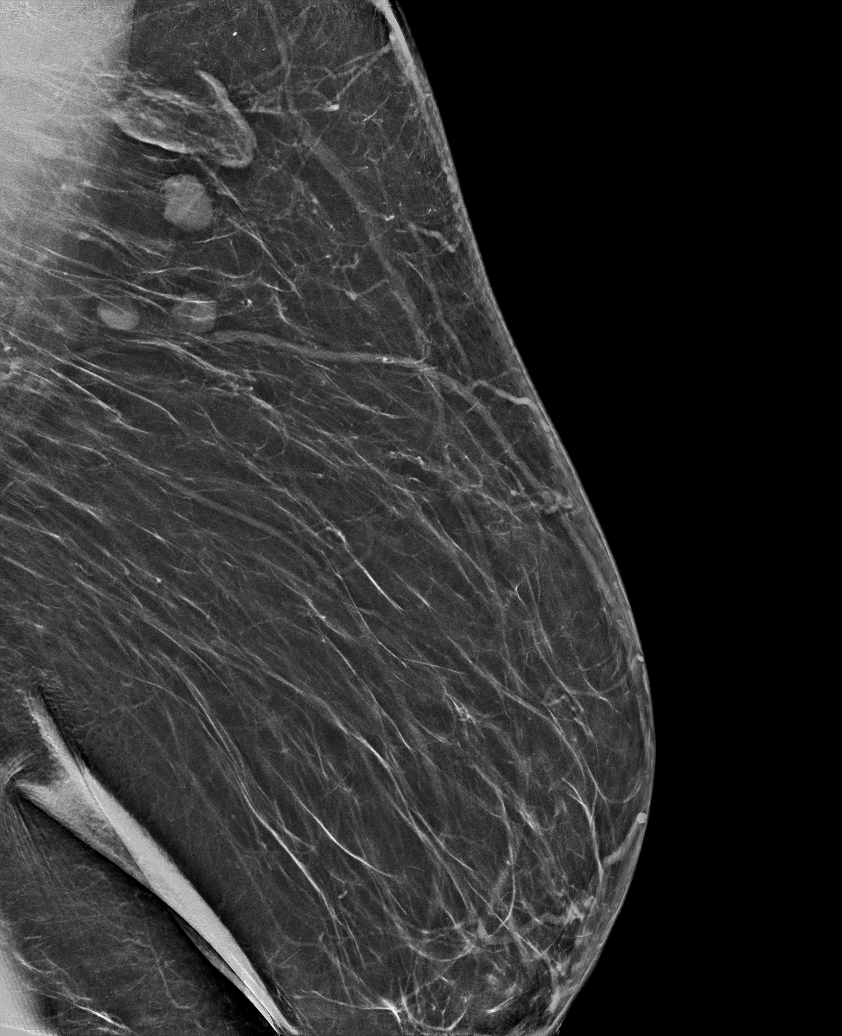

[L CC synth-2D]
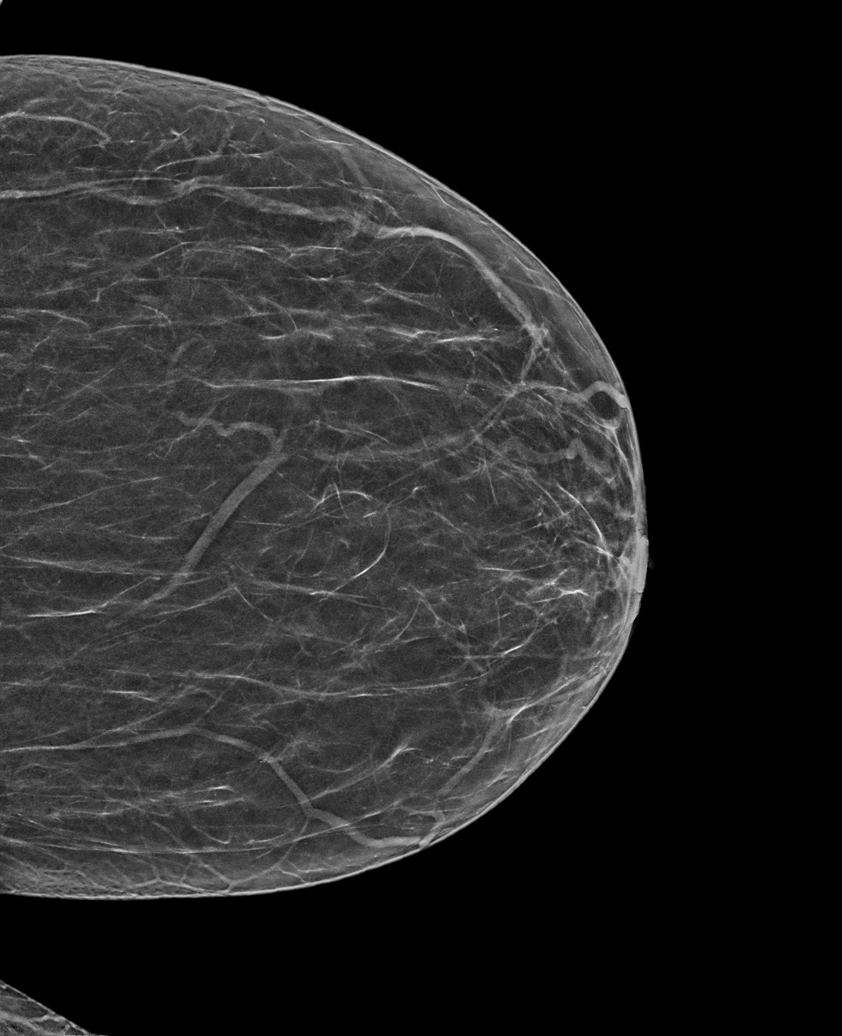

[R MLO synth-2D]
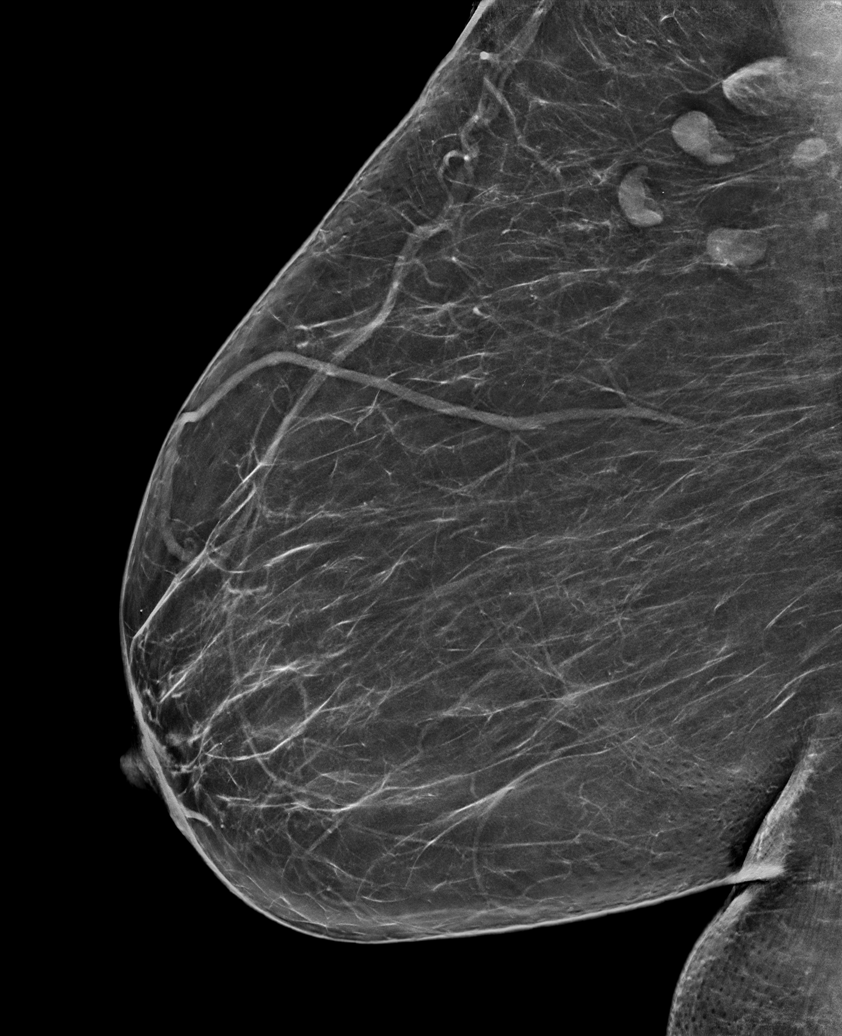

[L MLO tomo · tomo slice 39/76.0]
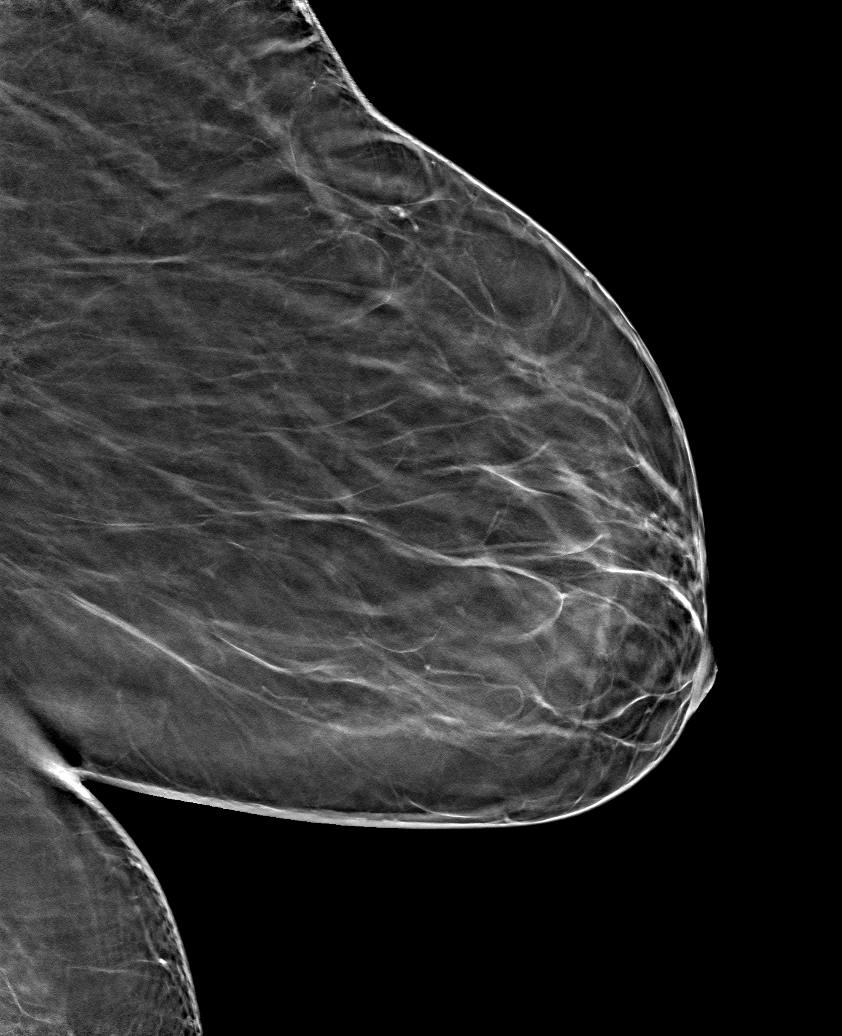

[6 of 30 positions shown; findings below may reference images not displayed]

FINDINGS: There are no findings suspicious for malignancy.
IMPRESSION: No mammographic evidence of malignancy. A result letter of this
screening mammogram will be mailed directly to the patient.

RECOMMENDATION:
Screening mammogram in one year. (Code:0E-3-N98)

BI-RADS CATEGORY  1: Negative.

## 2023-09-27 ENCOUNTER — Other Ambulatory Visit: Payer: Self-pay | Admitting: Family Medicine

## 2023-09-27 DIAGNOSIS — Z1231 Encounter for screening mammogram for malignant neoplasm of breast: Secondary | ICD-10-CM

## 2023-10-16 ENCOUNTER — Ambulatory Visit
Admission: RE | Admit: 2023-10-16 | Discharge: 2023-10-16 | Disposition: A | Discharge status: Home / Self Care | Payer: 59 | Source: Ambulatory Visit | Attending: Family Medicine | Admitting: Family Medicine

## 2023-10-16 DIAGNOSIS — Z1231 Encounter for screening mammogram for malignant neoplasm of breast: Secondary | ICD-10-CM | POA: Diagnosis present

## 2024-10-05 ENCOUNTER — Other Ambulatory Visit: Payer: Self-pay | Admitting: Family Medicine

## 2024-10-05 DIAGNOSIS — Z1231 Encounter for screening mammogram for malignant neoplasm of breast: Secondary | ICD-10-CM

## 2024-11-04 ENCOUNTER — Ambulatory Visit
Admission: RE | Admit: 2024-11-04 | Discharge: 2024-11-04 | Disposition: A | Discharge status: Home / Self Care | Source: Ambulatory Visit | Attending: Family Medicine | Admitting: Family Medicine

## 2024-11-04 DIAGNOSIS — Z1231 Encounter for screening mammogram for malignant neoplasm of breast: Secondary | ICD-10-CM | POA: Insufficient documentation
